# Patient Record
Sex: Male | Born: 1935 | Race: Black or African American | Hispanic: No | Marital: Married | Smoking: Former smoker
Health system: Southern US, Community
[De-identification: ages and names within clinical notes are randomized; demographics above are authoritative.]

## PROBLEM LIST (undated history)

## (undated) DIAGNOSIS — J449 Chronic obstructive pulmonary disease, unspecified: Secondary | ICD-10-CM

## (undated) DIAGNOSIS — I739 Peripheral vascular disease, unspecified: Secondary | ICD-10-CM

## (undated) DIAGNOSIS — I82409 Acute embolism and thrombosis of unspecified deep veins of unspecified lower extremity: Secondary | ICD-10-CM

## (undated) DIAGNOSIS — Z7901 Long term (current) use of anticoagulants: Secondary | ICD-10-CM

## (undated) DIAGNOSIS — I509 Heart failure, unspecified: Secondary | ICD-10-CM

## (undated) DIAGNOSIS — D649 Anemia, unspecified: Secondary | ICD-10-CM

## (undated) DIAGNOSIS — N189 Chronic kidney disease, unspecified: Secondary | ICD-10-CM

## (undated) DIAGNOSIS — N183 Chronic kidney disease, stage 3 unspecified: Secondary | ICD-10-CM

## (undated) DIAGNOSIS — M069 Rheumatoid arthritis, unspecified: Secondary | ICD-10-CM

## (undated) DIAGNOSIS — I10 Essential (primary) hypertension: Secondary | ICD-10-CM

## (undated) HISTORY — PX: ESOPHAGEAL DILATION: SHX303

## (undated) HISTORY — PX: COLONOSCOPY W/ BIOPSIES AND POLYPECTOMY: SHX1376

## (undated) HISTORY — PX: CATARACT EXTRACTION: SUR2

---

## 1980-05-24 HISTORY — PX: HERNIA REPAIR: SHX51

## 1998-03-03 ENCOUNTER — Ambulatory Visit (HOSPITAL_COMMUNITY): Admission: RE | Admit: 1998-03-03 | Discharge: 1998-03-03 | Payer: Self-pay | Admitting: Gastroenterology

## 1998-04-01 ENCOUNTER — Encounter: Payer: Self-pay | Admitting: Emergency Medicine

## 1998-04-01 ENCOUNTER — Emergency Department (HOSPITAL_COMMUNITY): Admission: EM | Admit: 1998-04-01 | Discharge: 1998-04-01 | Payer: Self-pay | Admitting: Emergency Medicine

## 2003-01-14 ENCOUNTER — Ambulatory Visit (HOSPITAL_COMMUNITY): Admission: RE | Admit: 2003-01-14 | Discharge: 2003-01-14 | Payer: Self-pay | Admitting: Gastroenterology

## 2003-01-14 ENCOUNTER — Encounter: Payer: Self-pay | Admitting: Gastroenterology

## 2003-08-20 ENCOUNTER — Ambulatory Visit (HOSPITAL_COMMUNITY): Admission: RE | Admit: 2003-08-20 | Discharge: 2003-08-20 | Payer: Self-pay | Admitting: Gastroenterology

## 2003-08-20 ENCOUNTER — Encounter (INDEPENDENT_AMBULATORY_CARE_PROVIDER_SITE_OTHER): Payer: Self-pay | Admitting: Specialist

## 2004-05-01 ENCOUNTER — Encounter: Admission: RE | Admit: 2004-05-01 | Discharge: 2004-05-01 | Payer: Self-pay | Admitting: General Surgery

## 2004-05-05 ENCOUNTER — Ambulatory Visit (HOSPITAL_BASED_OUTPATIENT_CLINIC_OR_DEPARTMENT_OTHER): Admission: RE | Admit: 2004-05-05 | Discharge: 2004-05-05 | Payer: Self-pay | Admitting: General Surgery

## 2004-05-05 ENCOUNTER — Ambulatory Visit (HOSPITAL_COMMUNITY): Admission: RE | Admit: 2004-05-05 | Discharge: 2004-05-05 | Payer: Self-pay | Admitting: General Surgery

## 2004-07-23 ENCOUNTER — Encounter: Admission: RE | Admit: 2004-07-23 | Discharge: 2004-07-23 | Payer: Self-pay | Admitting: Gastroenterology

## 2004-08-28 ENCOUNTER — Encounter (INDEPENDENT_AMBULATORY_CARE_PROVIDER_SITE_OTHER): Payer: Self-pay | Admitting: Specialist

## 2004-08-28 ENCOUNTER — Ambulatory Visit (HOSPITAL_COMMUNITY): Admission: RE | Admit: 2004-08-28 | Discharge: 2004-08-28 | Payer: Self-pay | Admitting: Gastroenterology

## 2005-03-19 ENCOUNTER — Encounter: Admission: RE | Admit: 2005-03-19 | Discharge: 2005-03-19 | Payer: Self-pay | Admitting: Rheumatology

## 2005-07-27 ENCOUNTER — Encounter (HOSPITAL_BASED_OUTPATIENT_CLINIC_OR_DEPARTMENT_OTHER): Admission: RE | Admit: 2005-07-27 | Discharge: 2005-10-25 | Payer: Self-pay | Admitting: Surgery

## 2005-10-28 ENCOUNTER — Encounter (HOSPITAL_BASED_OUTPATIENT_CLINIC_OR_DEPARTMENT_OTHER): Admission: RE | Admit: 2005-10-28 | Discharge: 2006-01-26 | Payer: Self-pay | Admitting: Surgery

## 2006-01-28 ENCOUNTER — Encounter (HOSPITAL_BASED_OUTPATIENT_CLINIC_OR_DEPARTMENT_OTHER): Admission: RE | Admit: 2006-01-28 | Discharge: 2006-02-20 | Payer: Self-pay | Admitting: Surgery

## 2006-02-23 ENCOUNTER — Encounter (HOSPITAL_BASED_OUTPATIENT_CLINIC_OR_DEPARTMENT_OTHER): Admission: RE | Admit: 2006-02-23 | Discharge: 2006-05-19 | Payer: Self-pay | Admitting: Surgery

## 2006-05-25 ENCOUNTER — Encounter (HOSPITAL_BASED_OUTPATIENT_CLINIC_OR_DEPARTMENT_OTHER): Admission: RE | Admit: 2006-05-25 | Discharge: 2006-08-23 | Payer: Self-pay | Admitting: Surgery

## 2006-06-02 ENCOUNTER — Ambulatory Visit: Admission: EM | Admit: 2006-06-02 | Discharge: 2006-06-02 | Payer: Self-pay | Admitting: Surgery

## 2006-06-02 ENCOUNTER — Encounter: Payer: Self-pay | Admitting: Vascular Surgery

## 2006-06-20 ENCOUNTER — Ambulatory Visit: Payer: Self-pay | Admitting: Internal Medicine

## 2006-06-20 LAB — CONVERTED CEMR LAB
Alkaline Phosphatase: 62 units/L (ref 39–117)
BUN: 21 mg/dL (ref 6–23)
Basophils Absolute: 0 10*3/uL (ref 0.0–0.1)
Basophils Relative: 0.3 % (ref 0.0–1.0)
Chloride: 105 meq/L (ref 96–112)
GFR calc Af Amer: 68 mL/min
GFR calc non Af Amer: 56 mL/min
Glucose, Bld: 102 mg/dL — ABNORMAL HIGH (ref 70–99)
MCHC: 32.5 g/dL (ref 30.0–36.0)
MCV: 86.4 fL (ref 78.0–100.0)
Monocytes Relative: 9 % (ref 3.0–11.0)
Neutrophils Relative %: 79.1 % — ABNORMAL HIGH (ref 43.0–77.0)
Platelets: 285 10*3/uL (ref 150–400)
Potassium: 4.8 meq/L (ref 3.5–5.1)
Pro B Natriuretic peptide (BNP): 15 pg/mL (ref 0.0–100.0)
Total Bilirubin: 0.5 mg/dL (ref 0.3–1.2)
Total Protein: 6.9 g/dL (ref 6.0–8.3)
WBC: 7.4 10*3/uL (ref 4.5–10.5)

## 2006-06-29 ENCOUNTER — Ambulatory Visit: Payer: Self-pay | Admitting: Internal Medicine

## 2006-06-29 ENCOUNTER — Ambulatory Visit: Payer: Self-pay | Admitting: Cardiology

## 2006-07-01 ENCOUNTER — Ambulatory Visit: Admission: RE | Admit: 2006-07-01 | Discharge: 2006-07-01 | Payer: Self-pay | Admitting: Surgery

## 2006-07-01 ENCOUNTER — Ambulatory Visit: Payer: Self-pay | Admitting: Vascular Surgery

## 2006-07-11 ENCOUNTER — Ambulatory Visit: Payer: Self-pay | Admitting: Internal Medicine

## 2006-08-08 ENCOUNTER — Ambulatory Visit: Payer: Self-pay | Admitting: Internal Medicine

## 2006-08-30 ENCOUNTER — Encounter (HOSPITAL_BASED_OUTPATIENT_CLINIC_OR_DEPARTMENT_OTHER): Admission: RE | Admit: 2006-08-30 | Discharge: 2006-11-28 | Payer: Self-pay | Admitting: Surgery

## 2006-12-02 ENCOUNTER — Encounter: Admission: RE | Admit: 2006-12-02 | Discharge: 2007-02-21 | Payer: Self-pay | Admitting: Internal Medicine

## 2007-02-22 ENCOUNTER — Encounter (HOSPITAL_BASED_OUTPATIENT_CLINIC_OR_DEPARTMENT_OTHER): Admission: RE | Admit: 2007-02-22 | Discharge: 2007-05-23 | Payer: Self-pay | Admitting: Internal Medicine

## 2007-05-23 ENCOUNTER — Encounter (HOSPITAL_BASED_OUTPATIENT_CLINIC_OR_DEPARTMENT_OTHER): Admission: RE | Admit: 2007-05-23 | Discharge: 2007-08-21 | Payer: Self-pay | Admitting: Internal Medicine

## 2007-08-23 ENCOUNTER — Encounter (HOSPITAL_BASED_OUTPATIENT_CLINIC_OR_DEPARTMENT_OTHER): Admission: RE | Admit: 2007-08-23 | Discharge: 2007-11-03 | Payer: Self-pay | Admitting: Surgery

## 2008-01-30 ENCOUNTER — Encounter: Admission: RE | Admit: 2008-01-30 | Discharge: 2008-01-30 | Payer: Self-pay | Admitting: Orthopedic Surgery

## 2008-01-31 ENCOUNTER — Encounter (INDEPENDENT_AMBULATORY_CARE_PROVIDER_SITE_OTHER): Payer: Self-pay | Admitting: Orthopedic Surgery

## 2008-01-31 ENCOUNTER — Ambulatory Visit (HOSPITAL_BASED_OUTPATIENT_CLINIC_OR_DEPARTMENT_OTHER): Admission: RE | Admit: 2008-01-31 | Discharge: 2008-01-31 | Payer: Self-pay | Admitting: Orthopedic Surgery

## 2008-11-26 ENCOUNTER — Ambulatory Visit (HOSPITAL_BASED_OUTPATIENT_CLINIC_OR_DEPARTMENT_OTHER): Admission: RE | Admit: 2008-11-26 | Discharge: 2008-11-26 | Payer: Self-pay | Admitting: Orthopedic Surgery

## 2009-10-07 ENCOUNTER — Encounter: Admission: RE | Admit: 2009-10-07 | Discharge: 2009-10-07 | Payer: Self-pay | Admitting: Gastroenterology

## 2010-06-25 ENCOUNTER — Emergency Department (HOSPITAL_COMMUNITY): Payer: MEDICARE

## 2010-06-25 ENCOUNTER — Inpatient Hospital Stay (HOSPITAL_COMMUNITY)
Admission: EM | Admit: 2010-06-25 | Discharge: 2010-07-02 | DRG: 175 | Disposition: A | Discharge status: Home / Self Care | Payer: MEDICARE | Attending: Family Medicine | Admitting: Family Medicine

## 2010-06-25 ENCOUNTER — Encounter (HOSPITAL_COMMUNITY): Payer: Self-pay | Admitting: Radiology

## 2010-06-25 DIAGNOSIS — I2789 Other specified pulmonary heart diseases: Secondary | ICD-10-CM | POA: Diagnosis present

## 2010-06-25 DIAGNOSIS — I2699 Other pulmonary embolism without acute cor pulmonale: Principal | ICD-10-CM | POA: Diagnosis present

## 2010-06-25 DIAGNOSIS — N189 Chronic kidney disease, unspecified: Secondary | ICD-10-CM | POA: Diagnosis present

## 2010-06-25 DIAGNOSIS — I825Z9 Chronic embolism and thrombosis of unspecified deep veins of unspecified distal lower extremity: Secondary | ICD-10-CM | POA: Diagnosis present

## 2010-06-25 DIAGNOSIS — I825Y9 Chronic embolism and thrombosis of unspecified deep veins of unspecified proximal lower extremity: Secondary | ICD-10-CM | POA: Diagnosis present

## 2010-06-25 DIAGNOSIS — I129 Hypertensive chronic kidney disease with stage 1 through stage 4 chronic kidney disease, or unspecified chronic kidney disease: Secondary | ICD-10-CM | POA: Diagnosis present

## 2010-06-25 DIAGNOSIS — Z8261 Family history of arthritis: Secondary | ICD-10-CM

## 2010-06-25 DIAGNOSIS — I872 Venous insufficiency (chronic) (peripheral): Secondary | ICD-10-CM | POA: Diagnosis present

## 2010-06-25 DIAGNOSIS — Z7982 Long term (current) use of aspirin: Secondary | ICD-10-CM

## 2010-06-25 DIAGNOSIS — J189 Pneumonia, unspecified organism: Secondary | ICD-10-CM | POA: Diagnosis present

## 2010-06-25 DIAGNOSIS — Z7901 Long term (current) use of anticoagulants: Secondary | ICD-10-CM

## 2010-06-25 DIAGNOSIS — M069 Rheumatoid arthritis, unspecified: Secondary | ICD-10-CM | POA: Diagnosis present

## 2010-06-25 DIAGNOSIS — Z87891 Personal history of nicotine dependence: Secondary | ICD-10-CM

## 2010-06-25 DIAGNOSIS — N179 Acute kidney failure, unspecified: Secondary | ICD-10-CM | POA: Diagnosis present

## 2010-06-25 DIAGNOSIS — M109 Gout, unspecified: Secondary | ICD-10-CM | POA: Diagnosis present

## 2010-06-25 HISTORY — DX: Essential (primary) hypertension: I10

## 2010-06-25 LAB — CK TOTAL AND CKMB (NOT AT ARMC)
CK, MB: 1 ng/mL (ref 0.3–4.0)
Relative Index: INVALID (ref 0.0–2.5)
Total CK: 37 U/L (ref 7–232)

## 2010-06-25 LAB — COMPREHENSIVE METABOLIC PANEL
ALT: 20 U/L (ref 0–53)
BUN: 28 mg/dL — ABNORMAL HIGH (ref 6–23)
CO2: 27 mEq/L (ref 19–32)
GFR calc non Af Amer: 35 mL/min — ABNORMAL LOW (ref >60)
Glucose, Bld: 134 mg/dL — ABNORMAL HIGH (ref 70–99)
Total Bilirubin: 0.4 mg/dL (ref 0.3–1.2)
Total Protein: 6.4 g/dL (ref 6.0–8.3)

## 2010-06-25 LAB — TROPONIN I: Troponin I: 0.02 ng/mL (ref 0.00–0.06)

## 2010-06-25 LAB — POCT CARDIAC MARKERS
Myoglobin, poc: 134 ng/mL (ref 12–200)
Myoglobin, poc: 118 ng/mL (ref 12–200)
Troponin i, poc: <0.05 ng/mL (ref 0.00–0.09)
Troponin i, poc: <0.05 ng/mL (ref 0.00–0.09)

## 2010-06-25 LAB — DIFFERENTIAL
Basophils Absolute: 0 10*3/uL (ref 0.0–0.1)
Eosinophils Absolute: 0.2 10*3/uL (ref 0.0–0.7)
Monocytes Relative: 12 % (ref 3–12)
Neutro Abs: 8.4 10*3/uL — ABNORMAL HIGH (ref 1.7–7.7)

## 2010-06-25 LAB — CBC
MCHC: 34.1 g/dL (ref 30.0–36.0)
RDW: 17 % — ABNORMAL HIGH (ref 11.5–15.5)

## 2010-06-26 ENCOUNTER — Inpatient Hospital Stay (HOSPITAL_COMMUNITY): Payer: MEDICARE

## 2010-06-26 DIAGNOSIS — R0602 Shortness of breath: Secondary | ICD-10-CM

## 2010-06-26 LAB — LIPID PANEL
Cholesterol: 148 mg/dL (ref 0–200)
HDL: 72 mg/dL (ref >39)
LDL Cholesterol: 72 mg/dL (ref 0–99)

## 2010-06-26 LAB — CBC
HCT: 29.8 % — ABNORMAL LOW (ref 39.0–52.0)
MCH: 27.9 pg (ref 26.0–34.0)
RBC: 3.62 MIL/uL — ABNORMAL LOW (ref 4.22–5.81)
RDW: 16.6 % — ABNORMAL HIGH (ref 11.5–15.5)

## 2010-06-26 LAB — CARDIAC PANEL(CRET KIN+CKTOT+MB+TROPI)
Relative Index: INVALID (ref 0.0–2.5)
Total CK: 42 U/L (ref 7–232)
Troponin I: 0.02 ng/mL (ref 0.00–0.06)
Troponin I: 0.02 ng/mL (ref 0.00–0.06)

## 2010-06-26 LAB — COMPREHENSIVE METABOLIC PANEL
AST: 22 U/L (ref 0–37)
Albumin: 2.9 g/dL — ABNORMAL LOW (ref 3.5–5.2)
Alkaline Phosphatase: 50 U/L (ref 39–117)
Chloride: 103 mEq/L (ref 96–112)
GFR calc Af Amer: 46 mL/min — ABNORMAL LOW (ref >60)
GFR calc non Af Amer: 38 mL/min — ABNORMAL LOW (ref >60)
Glucose, Bld: 151 mg/dL — ABNORMAL HIGH (ref 70–99)
Total Bilirubin: 0.3 mg/dL (ref 0.3–1.2)

## 2010-06-26 LAB — HEMOGLOBIN A1C
Hgb A1c MFr Bld: 6.3 % — ABNORMAL HIGH (ref <5.7)
Mean Plasma Glucose: 134 mg/dL — ABNORMAL HIGH (ref <117)

## 2010-06-26 LAB — BRAIN NATRIURETIC PEPTIDE: Pro B Natriuretic peptide (BNP): <30 pg/mL (ref 0.0–100.0)

## 2010-06-26 LAB — TSH: TSH: 1.256 u[IU]/mL (ref 0.350–4.500)

## 2010-06-26 MED ORDER — XENON XE 133 GAS
10.0000 | GAS_FOR_INHALATION | Freq: Once | RESPIRATORY_TRACT | Status: AC | PRN
  Administered 2010-06-26: 10 via RESPIRATORY_TRACT

## 2010-06-26 MED ORDER — TECHNETIUM TO 99M ALBUMIN AGGREGATED
6.0000 | Freq: Once | INTRAVENOUS | Status: AC | PRN
  Administered 2010-06-26: 6 via INTRAVENOUS

## 2010-06-27 LAB — BASIC METABOLIC PANEL
CO2: 22 mEq/L (ref 19–32)
Calcium: 8.3 mg/dL — ABNORMAL LOW (ref 8.4–10.5)
Chloride: 105 mEq/L (ref 96–112)
Glucose, Bld: 110 mg/dL — ABNORMAL HIGH (ref 70–99)
Sodium: 139 mEq/L (ref 135–145)

## 2010-06-27 LAB — CBC
Hemoglobin: 9.9 g/dL — ABNORMAL LOW (ref 13.0–17.0)
Platelets: 160 10*3/uL (ref 150–400)
WBC: 14.1 10*3/uL — ABNORMAL HIGH (ref 4.0–10.5)

## 2010-06-28 ENCOUNTER — Inpatient Hospital Stay (HOSPITAL_COMMUNITY): Payer: MEDICARE

## 2010-06-28 LAB — CBC
MCHC: 34.6 g/dL (ref 30.0–36.0)
RBC: 4.2 MIL/uL — ABNORMAL LOW (ref 4.22–5.81)
WBC: 14.1 10*3/uL — ABNORMAL HIGH (ref 4.0–10.5)

## 2010-06-28 LAB — BASIC METABOLIC PANEL
BUN: 31 mg/dL — ABNORMAL HIGH (ref 6–23)
CO2: 19 mEq/L (ref 19–32)
Calcium: 8.5 mg/dL (ref 8.4–10.5)
Creatinine, Ser: 1.78 mg/dL — ABNORMAL HIGH (ref 0.4–1.5)
Glucose, Bld: 176 mg/dL — ABNORMAL HIGH (ref 70–99)

## 2010-06-28 LAB — PROTIME-INR: INR: 1.01 (ref 0.00–1.49)

## 2010-06-29 ENCOUNTER — Inpatient Hospital Stay (HOSPITAL_COMMUNITY): Payer: MEDICARE

## 2010-06-29 LAB — CBC
HCT: 28.7 % — ABNORMAL LOW (ref 39.0–52.0)
Hemoglobin: 10.1 g/dL — ABNORMAL LOW (ref 13.0–17.0)
MCV: 79.9 fL (ref 78.0–100.0)
Platelets: 170 10*3/uL (ref 150–400)
RBC: 3.59 MIL/uL — ABNORMAL LOW (ref 4.22–5.81)
WBC: 15.1 10*3/uL — ABNORMAL HIGH (ref 4.0–10.5)

## 2010-06-29 LAB — BASIC METABOLIC PANEL
BUN: 41 mg/dL — ABNORMAL HIGH (ref 6–23)
Chloride: 105 mEq/L (ref 96–112)
Potassium: 4 mEq/L (ref 3.5–5.1)

## 2010-06-29 LAB — BRAIN NATRIURETIC PEPTIDE: Pro B Natriuretic peptide (BNP): 47 pg/mL (ref 0.0–100.0)

## 2010-06-29 LAB — CARDIAC PANEL(CRET KIN+CKTOT+MB+TROPI)
CK, MB: 2.6 ng/mL (ref 0.3–4.0)
Relative Index: 1.4 (ref 0.0–2.5)

## 2010-06-30 LAB — CBC
HCT: 28.8 % — ABNORMAL LOW (ref 39.0–52.0)
MCV: 79.8 fL (ref 78.0–100.0)
RBC: 3.61 MIL/uL — ABNORMAL LOW (ref 4.22–5.81)
RDW: 16.6 % — ABNORMAL HIGH (ref 11.5–15.5)
WBC: 13.3 10*3/uL — ABNORMAL HIGH (ref 4.0–10.5)

## 2010-06-30 LAB — CARDIAC PANEL(CRET KIN+CKTOT+MB+TROPI)
CK, MB: 2.5 ng/mL (ref 0.3–4.0)
Relative Index: 2.1 (ref 0.0–2.5)
Troponin I: 0.02 ng/mL (ref 0.00–0.06)

## 2010-06-30 LAB — COMPREHENSIVE METABOLIC PANEL
ALT: 35 U/L (ref 0–53)
Albumin: 2.4 g/dL — ABNORMAL LOW (ref 3.5–5.2)
Alkaline Phosphatase: 45 U/L (ref 39–117)
GFR calc Af Amer: 53 mL/min — ABNORMAL LOW (ref >60)
Potassium: 3.9 mEq/L (ref 3.5–5.1)
Sodium: 136 mEq/L (ref 135–145)
Total Protein: 5.6 g/dL — ABNORMAL LOW (ref 6.0–8.3)

## 2010-07-01 LAB — PROTIME-INR
INR: 1.48 (ref 0.00–1.49)
Prothrombin Time: 18.1 seconds — ABNORMAL HIGH (ref 11.6–15.2)

## 2010-07-01 LAB — BASIC METABOLIC PANEL
BUN: 29 mg/dL — ABNORMAL HIGH (ref 6–23)
CO2: 24 mEq/L (ref 19–32)
Calcium: 7.9 mg/dL — ABNORMAL LOW (ref 8.4–10.5)
Chloride: 106 mEq/L (ref 96–112)
Creatinine, Ser: 1.4 mg/dL (ref 0.4–1.5)
GFR calc Af Amer: 58 mL/min — ABNORMAL LOW (ref >60)
GFR calc non Af Amer: 48 mL/min — ABNORMAL LOW (ref >60)
Glucose, Bld: 92 mg/dL (ref 70–99)
Potassium: 3.7 mEq/L (ref 3.5–5.1)
Sodium: 139 mEq/L (ref 135–145)

## 2010-07-01 LAB — BRAIN NATRIURETIC PEPTIDE: Pro B Natriuretic peptide (BNP): 45 pg/mL (ref 0.0–100.0)

## 2010-07-01 LAB — CBC
MCH: 28.5 pg (ref 26.0–34.0)
MCV: 80.3 fL (ref 78.0–100.0)
Platelets: 187 10*3/uL (ref 150–400)
RDW: 16.4 % — ABNORMAL HIGH (ref 11.5–15.5)

## 2010-07-02 LAB — CBC
HCT: 30.8 % — ABNORMAL LOW (ref 39.0–52.0)
MCH: 28 pg (ref 26.0–34.0)
MCV: 80.6 fL (ref 78.0–100.0)
Platelets: 206 10*3/uL (ref 150–400)
RBC: 3.82 MIL/uL — ABNORMAL LOW (ref 4.22–5.81)

## 2010-07-02 LAB — CULTURE, BLOOD (ROUTINE X 2)
Culture  Setup Time: 201202030523
Culture: NO GROWTH

## 2010-07-07 NOTE — Discharge Summary (Signed)
  NAME:  SAVANNAH, ERBE NO.:  [id number]  MEDICAL RECORD NO.:  [id number]           PATIENT TYPE:  I  LOCATION:  2020                         FACILITY:  [facility]  PHYSICIAN:  Tarry Kos, MD       DATE OF BIRTH:  December 06, 1920  DATE OF ADMISSION:  06/25/2010 DATE OF DISCHARGE:  07/02/2010                              DISCHARGE SUMMARY   ADDENDUM  Mr. Finstad's discharge was held up yesterday because he did not have a right home.  His INR was checked today, it was 1.98, his INR yesterday was 1.48.  He is new on Coumadin secondary to intermediate probability V/Q scan for PE.  I have given him Coumadin 3 mg today.  He will need an INR checked tomorrow with his primary care physician.  He has been receiving 6 mg of Coumadin for the last 3 or 4 days.          ______________________________ Tarry Kos, MD     RD/MEDQ  D:  07/02/2010  T:  07/03/2010  Job:  [id number]  Electronically Signed by Eldridge Dace MD on 07/07/2010 01:30:38 PM

## 2010-07-07 NOTE — Discharge Summary (Signed)
  NAME:  [name] [name], [name] [name] NO.:  [id number]  MEDICAL RECORD NO.:  [id number]           PATIENT TYPE:  I  LOCATION:  2020                         FACILITY:  [facility]  PHYSICIAN:  Tarry Kos, MD       DATE OF BIRTH:  06-Dec-1920  DATE OF ADMISSION:  06/25/2010 DATE OF DISCHARGE:  07/02/2010                              DISCHARGE SUMMARY   DISCHARGE DIAGNOSES: 1. Pulmonary emboli. 2. Deep venous thrombosis. 3. Community-acquired pneumonia. 4. Pleuritic chest pain. 5. Chronic renal failure. 6. Rheumatoid arthritis.  SUMMARY OF HOSPITAL COURSE:  [name] is an 77 year old male who presented to the emergency room with shortness of breath who was diagnosed with community-acquired pneumonia, but also had a VQ scan, which showed intermediate probability for pulmonary emboli.  He subsequently had ultrasound of his lower extremities, which showed a DVT in his left lower extremity and DVT in right lower extremity, question whether or not this was chronic or not.  He did not have hemodynamic instability problems from his pulmonary emboli.  He remained stable throughout his hospitalization.  His cardiac enzymes were negative.  He did not significant amount of oxygen.  He has been on room air for over 3 days now.  He has been started on Coumadin and Lovenox for bridging. He was given 6 mg of Coumadin today, he was given 6 mg on __________. His INR today on July 01, 2010, is 1.4, his INR on June 30, 2010, was somehow not measured.  On June 29, 2010, it was 1.09.  The patient is being discharged home.  He has refused home health.  He is to follow up with his primary care physician in approximately 2 days and have an INR checked in the morning and have his Coumadin adjusted.  So he will need to be on Coumadin for 6-12 months for his intermediate probability of pulmonary emboli on his VQ scan and consider life-long since he obviously has the history of DVTs in his lower  extremities, which appear chronic.  PHYSICAL EXAMINATION:  VITAL SIGNS:  Again he has been afebrile.  Vital signs stable. GENERAL:  Alert and oriented x1, no apparent distress, cooperative and friendly. CARDIAC:  Regular rate and rhythm without murmurs, rubs, or gallops. CHEST:  Clear to auscultation bilaterally.  No wheeze, rhonchi, or rales. ABDOMEN:  Soft, nontender, nondistended.  Positive bowel sounds.  No hepatosplenomegaly. EXTREMITIES:  No clubbing, cyanosis, or edema. PSYCHIATRIC:  Normal affect. NEUROLOGIC:  No focal neurologic deficits.  He is ambulating well here in the hospital.  Again we have offered to set up home health.  He has refused it.  He has been educated to provide himself the Lovenox injection.  He is to continue Lovenox full dosing, make sure he has INRs between 2 and 3 for 2 consecutive days on Coumadin at which point his Lovenox can stop.          ______________________________ Tarry Kos, MD     RD/MEDQ  D:  07/02/2010  T:  07/03/2010  Job:  [id number]  Electronically Signed by Eldridge Dace MD on 07/07/2010 01:30:49 PM

## 2010-08-01 NOTE — H&P (Signed)
NAME:  [name] [name], FILL NO.:  [id number]  MEDICAL RECORD NO.:  [id number]           PATIENT TYPE:  E  LOCATION:  [facility]                         FACILITY:  [facility]  PHYSICIAN:  Richarda Overlie, MD       DATE OF BIRTH:  07/09/1920  DATE OF ADMISSION:  06/25/2010 DATE OF DISCHARGE:                             HISTORY & PHYSICAL   PRIMARY CARE PHYSICIAN:  Dr. Doristine Counter in [facility].  PRIMARY CARDIOLOGIST:  Nanetta Batty, MD  PRIMARY RHEUMATOLOGIST:  Aundra Dubin, MD  PRIMARY PULMONOLOGIST:  Rennis Chris. Maple Hudson, MD, FCCP, FACP  CHIEF COMPLAINT:  Shortness of breath.  SUBJECTIVE:  This is an 77 year old male with a history of rheumatoid arthritis, several years on methotrexate at home comes in with shortness of breath and dyspnea on exertion, as well as intermittent chest pain for the last 2-3 days.  The patient noted that this morning his breathing was particularly worse at around 7 o'clock and has been progressively getting worse during the course of the day.  It is associated with left-sided chest pain radiating into his left arm. There is no history of any prior coronary artery disease and according to the daughter, the patient never been tested for underlying coronary artery disease and has never had a stress test or a cardiac cath.  He however has had intermittent problems with shortness of breath for over 20 years and he uses a Combivent inhaler every now and then.  He has been evaluated by Dr. Jetty Duhamel and was told that his dyspnea was nonspecific.  He does not have any objective evidence of interstitial lung disease related to his rheumatoid arthritis.  He denies any fever, chills, rigors at home.  Denies any syncope, near-syncope, lightheadedness.  Denies any nausea, vomiting, abdominal pain, diarrhea, constipation, blood in the stool, black tarry stool.  Denies any recent history of travel.  There is no history of DVT or PE.  In the  ED, the patient is found to be tachycardic with a rate of 120 and the rhythm appears to be sinus tachycardia.  He is otherwise hemodynamically stable.  A chest x-ray was suspicious for community-acquired pneumonia.  PAST MEDICAL HISTORY:  History of gout, rheumatoid arthritis, compound fracture of the left tibia, and hypertension.  PAST SURGICAL HISTORY:  Hernia repair.  SOCIAL HISTORY:  He smoked one pack a day for about 10-15 years and quit smoking about 35 years ago.  He quit drinking alcohol in 1946.  He lives at home with his wife.  He has no history of asbestos exposure.  FAMILY HISTORY:  History of rheumatoid arthritis in the father.  PHYSICAL EXAMINATION:  VITAL SIGNS:  Blood pressure 130/80, pulse 125, respirations 22, temperature 98.9. GENERAL:  The patient appears to be comfortable in no acute cardiopulmonary distress. HEENT:  Pupils equal and reactive.  Extraocular movements intact. NECK:  Supple.  No JVD. LUNGS:  Bibasilar rhonchi with minimal wheezing. CARDIOVASCULAR:  Tachycardia with regular rate and rhythm.  No murmurs, rubs, or gallops. ABDOMEN:  Obese, soft, nontender, nondistended. EXTREMITIES:  Trace pedal edema in bilateral lower extremities.  PSYCHIATRIC:  Appropriate mood and affect.  REVIEW OF SYSTEMS:  Complete review of systems was done as documented in HPI.  ALLERGIES:  No known drug allergies.  HOME MEDICATIONS:  Aspirin, lisinopril, methotrexate once a week.  LABORATORY DATA:  Chest x-ray shows infiltrate in the posterior left lower lobe.  Sodium 136, potassium 4.9, chloride 101, bicarb 27, glucose 134, BUN is 28, creatinine 1.86, total protein 6.4, albumin 3.2, AST 25, ALT 20, alk phos 50, total bilirubin 0.4, WBC 10.9, hemoglobin 10.7, hematocrit 31.4, and platelet count of 176.  ASSESSMENT AND PLAN: 1. Shortness of breath, just been probably secondary to community-     acquired pneumonia. 2. Chronic shortness of breath, rule out  interstitial lung disease as     well as congestive heart failure with recent evaluation done by Dr.     Nanetta Batty, 2-3 weeks ago.  The patient is unaware of the     results of his cardiac echo. 3. Renal insufficiency.  The patient is aware of his renal     insufficiency, but his baseline creatinine is unknown.  PLAN:  The patient will be admitted to telemetry for further evaluation of his chest pain and shortness of breath.  We will empirically start him on Avelox for community-acquired pneumonia.  We will start him on DuoNeb q.6.  He did receive a dose of Solu-Medrol in the ED.  However, I do not feel the patient needs continuation of his steroids as he is not wheezing a whole lot and I feel that his shortness of breath is improved significantly after receiving his DuoNeb treatment in the ED.  He will also have a CT of the chest without contrast to rule out any underlying interstitial lung disease.  We will check a D-dimer because of his sinus tachycardia, if this is elevated we will do a V/Q to rule out PE.  We will try to obtain the results of his echocardiogram from Dr. Hazle Coca office in the morning.  His rheumatoid arthritis appears to be stable and I do not feel that he is having a flare, we will hold methotrexate in the setting of his pneumonia. Renal insufficiency.  His baseline creatinine is unknown, but the patient does have a history of renal insufficiency.  We will try to obtain his outpatient labs from Dr. Mellody Life office in the morning and hydrate him with IV fluids to see if his kidney function improves.  He is a full code.     Richarda Overlie, MD     NA/MEDQ  D:  06/25/2010  T:  06/25/2010  Job:  [id number]  Electronically Signed by Richarda Overlie MD on 08/01/2010 07:28:20 AM

## 2010-08-30 LAB — BASIC METABOLIC PANEL
CO2: 28 mEq/L (ref 19–32)
Chloride: 103 mEq/L (ref 96–112)
Creatinine, Ser: 1.56 mg/dL — ABNORMAL HIGH (ref 0.4–1.5)
GFR calc Af Amer: 51 mL/min — ABNORMAL LOW (ref >60)
Glucose, Bld: 97 mg/dL (ref 70–99)

## 2010-10-04 ENCOUNTER — Inpatient Hospital Stay (HOSPITAL_COMMUNITY)
Admission: EM | Admit: 2010-10-04 | Discharge: 2010-10-08 | DRG: 391 | Disposition: A | Discharge status: Home / Self Care | Payer: Medicare Other | Attending: Internal Medicine | Admitting: Internal Medicine

## 2010-10-04 DIAGNOSIS — E871 Hypo-osmolality and hyponatremia: Secondary | ICD-10-CM | POA: Diagnosis present

## 2010-10-04 DIAGNOSIS — I959 Hypotension, unspecified: Secondary | ICD-10-CM | POA: Diagnosis present

## 2010-10-04 DIAGNOSIS — I129 Hypertensive chronic kidney disease with stage 1 through stage 4 chronic kidney disease, or unspecified chronic kidney disease: Secondary | ICD-10-CM | POA: Diagnosis present

## 2010-10-04 DIAGNOSIS — D649 Anemia, unspecified: Secondary | ICD-10-CM | POA: Diagnosis present

## 2010-10-04 DIAGNOSIS — M069 Rheumatoid arthritis, unspecified: Secondary | ICD-10-CM | POA: Diagnosis present

## 2010-10-04 DIAGNOSIS — Z7901 Long term (current) use of anticoagulants: Secondary | ICD-10-CM

## 2010-10-04 DIAGNOSIS — R4182 Altered mental status, unspecified: Secondary | ICD-10-CM | POA: Diagnosis present

## 2010-10-04 DIAGNOSIS — I509 Heart failure, unspecified: Secondary | ICD-10-CM | POA: Diagnosis present

## 2010-10-04 DIAGNOSIS — N400 Enlarged prostate without lower urinary tract symptoms: Secondary | ICD-10-CM | POA: Diagnosis present

## 2010-10-04 DIAGNOSIS — D696 Thrombocytopenia, unspecified: Secondary | ICD-10-CM | POA: Diagnosis present

## 2010-10-04 DIAGNOSIS — N179 Acute kidney failure, unspecified: Secondary | ICD-10-CM | POA: Diagnosis not present

## 2010-10-04 DIAGNOSIS — R000 Tachycardia, unspecified: Secondary | ICD-10-CM | POA: Diagnosis present

## 2010-10-04 DIAGNOSIS — A088 Other specified intestinal infections: Principal | ICD-10-CM | POA: Diagnosis present

## 2010-10-04 DIAGNOSIS — I5030 Unspecified diastolic (congestive) heart failure: Secondary | ICD-10-CM | POA: Diagnosis present

## 2010-10-04 DIAGNOSIS — I872 Venous insufficiency (chronic) (peripheral): Secondary | ICD-10-CM | POA: Diagnosis present

## 2010-10-04 DIAGNOSIS — N183 Chronic kidney disease, stage 3 unspecified: Secondary | ICD-10-CM | POA: Diagnosis present

## 2010-10-04 DIAGNOSIS — L97809 Non-pressure chronic ulcer of other part of unspecified lower leg with unspecified severity: Secondary | ICD-10-CM | POA: Diagnosis present

## 2010-10-04 DIAGNOSIS — J9600 Acute respiratory failure, unspecified whether with hypoxia or hypercapnia: Secondary | ICD-10-CM | POA: Diagnosis not present

## 2010-10-04 DIAGNOSIS — R4789 Other speech disturbances: Secondary | ICD-10-CM | POA: Diagnosis present

## 2010-10-04 DIAGNOSIS — I2782 Chronic pulmonary embolism: Secondary | ICD-10-CM | POA: Diagnosis present

## 2010-10-05 ENCOUNTER — Inpatient Hospital Stay (HOSPITAL_COMMUNITY): Payer: Medicare Other

## 2010-10-05 ENCOUNTER — Emergency Department (HOSPITAL_COMMUNITY): Payer: Medicare Other

## 2010-10-05 LAB — POCT I-STAT, CHEM 8
BUN: 45 mg/dL — ABNORMAL HIGH (ref 6–23)
Creatinine, Ser: 2 mg/dL — ABNORMAL HIGH (ref 0.4–1.5)
Glucose, Bld: 111 mg/dL — ABNORMAL HIGH (ref 70–99)
Potassium: 5 mEq/L (ref 3.5–5.1)
Sodium: 133 mEq/L — ABNORMAL LOW (ref 135–145)
TCO2: 24 mmol/L (ref 0–100)

## 2010-10-05 LAB — DIFFERENTIAL
Basophils Relative: 0 % (ref 0–1)
Eosinophils Absolute: 0 10*3/uL (ref 0.0–0.7)
Eosinophils Relative: 0 % (ref 0–5)
Monocytes Absolute: 0.7 10*3/uL (ref 0.1–1.0)
Monocytes Relative: 8 % (ref 3–12)

## 2010-10-05 LAB — CBC
HCT: 29.2 % — ABNORMAL LOW (ref 39.0–52.0)
Hemoglobin: 10.2 g/dL — ABNORMAL LOW (ref 13.0–17.0)
Hemoglobin: 11.6 g/dL — ABNORMAL LOW (ref 13.0–17.0)
MCH: 27.9 pg (ref 26.0–34.0)
MCH: 28.6 pg (ref 26.0–34.0)
MCHC: 33.8 g/dL (ref 30.0–36.0)
MCHC: 34.9 g/dL (ref 30.0–36.0)
MCV: 81.8 fL (ref 78.0–100.0)
Platelets: 180 10*3/uL (ref 150–400)
RDW: 17.6 % — ABNORMAL HIGH (ref 11.5–15.5)

## 2010-10-05 LAB — COMPREHENSIVE METABOLIC PANEL
ALT: 22 U/L (ref 0–53)
ALT: 26 U/L (ref 0–53)
AST: 28 U/L (ref 0–37)
CO2: 25 mEq/L (ref 19–32)
Calcium: 9.5 mg/dL (ref 8.4–10.5)
Calcium: 8.1 mg/dL — ABNORMAL LOW (ref 8.4–10.5)
GFR calc Af Amer: 45 mL/min — ABNORMAL LOW (ref >60)
GFR calc Af Amer: 54 mL/min — ABNORMAL LOW (ref >60)
GFR calc non Af Amer: 37 mL/min — ABNORMAL LOW (ref >60)
Glucose, Bld: 186 mg/dL — ABNORMAL HIGH (ref 70–99)
Sodium: 132 mEq/L — ABNORMAL LOW (ref 135–145)
Sodium: 134 mEq/L — ABNORMAL LOW (ref 135–145)
Total Protein: 6 g/dL (ref 6.0–8.3)

## 2010-10-05 LAB — URINALYSIS, ROUTINE W REFLEX MICROSCOPIC
Leukocytes, UA: NEGATIVE
Protein, ur: NEGATIVE mg/dL
Urobilinogen, UA: 0.2 mg/dL (ref 0.0–1.0)

## 2010-10-05 LAB — TSH: TSH: 0.951 u[IU]/mL (ref 0.350–4.500)

## 2010-10-05 LAB — PROTIME-INR: Prothrombin Time: 25.8 seconds — ABNORMAL HIGH (ref 11.6–15.2)

## 2010-10-05 LAB — URINE MICROSCOPIC-ADD ON

## 2010-10-05 LAB — CK TOTAL AND CKMB (NOT AT ARMC): Total CK: 165 U/L (ref 7–232)

## 2010-10-05 LAB — PROCALCITONIN: Procalcitonin: 0.33 ng/mL

## 2010-10-05 LAB — HEPATIC FUNCTION PANEL
ALT: 24 U/L (ref 0–53)
AST: 32 U/L (ref 0–37)
Bilirubin, Direct: 0.1 mg/dL (ref 0.0–0.3)
Total Bilirubin: 0.3 mg/dL (ref 0.3–1.2)

## 2010-10-05 LAB — POCT CARDIAC MARKERS
CKMB, poc: 1.2 ng/mL (ref 1.0–8.0)
Myoglobin, poc: >500 ng/mL (ref 12–200)

## 2010-10-05 NOTE — Consult Note (Signed)
NAME:  [name] [name], [name] [name] NO.:  [id number]  MEDICAL RECORD NO.:  [id number]           PATIENT TYPE:  E  LOCATION:  [facility]                         FACILITY:  [facility]  PHYSICIAN:  Mont Dutton, MD    DATE OF BIRTH:  1920/05/26  DATE OF CONSULTATION:  10/04/2010 DATE OF DISCHARGE:                                CONSULTATION   REQUESTING PHYSICIAN:  Hassan Buckler. Caporossi, MD.  REASON FOR CONSULTATION:  "Stroke."  HISTORY OF PRESENT ILLNESS:  This is an 77 year old African American male with a history of PE, rheumatoid arthritis, hypertension, who went to dinner tonight with family.  When they returned home, family noticed that his speech was garbled.  They then tried to get him into bed, but he was unable to stand.  911 was called.  EMS document tachycardiac, PVCs, and hot to the touch.  No focal deficits were noted on their examination.  He states that he has had urinary frequency for the past few days.  At the moment, he complains of being very cold and is having rigors.REVIEW OF SYSTEMS:  Complete 14-point review of systems is negative other than what is mentioned in the HPI.  PAST MEDICAL HISTORY: 1. Rheumatoid arthritis. 2. Gout. 3. Peripheral vascular disease. 4. Lower extremity ulcer. 5. COPD. 6. Hypertension. 7. Left rotator cuff injury. 8. 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 9. PE. 10. DVT  MEDICATIONS: 1. Lisinopril 20 mg daily. 2. Prilosec 20 mg daily. 3. Prednisone. 4. Coumadin. 5. Tamsulosin 0.5 mg at bedtime. 6. Lasix 20 mg daily. 7. Methotrexate 2.5 mg 4 tablets every Monday and Tuesday. 8. Calcium. 9. Folic acid 1 mg daily.  ALLERGIES:  CONTRAST DYE.  FAMILY HISTORY:  Noncontributory.  SOCIAL HISTORY:  He lives with his wife.  He denies smoking, alcohol or illicits.  PHYSICAL EXAMINATION:  VITAL SIGNS:  Oral temperature read greater than 102.  Additional vitals have not been acquired yet. GENERAL:   Supine, elderly male with rigors. HEENT:  Mucous membranes moist. CARDIOVASCULAR:  Tachycardic, regular. RESPIRATORY:  Clear to auscultation anteriorly. ABDOMEN:  Soft. SKIN:  No rashes and lesions. NECK:  Supple. MENTAL STATUS:  Alert and oriented to person, place, and year.  No dysarthria is noted.  Naming and repetition are intact.  Speech is fluent. CRANIAL NERVES:  Pupils are equal, round, and reactive to light.  Full visual fields.  Extraocular movements intact.  No facial sensory deficits.  Facial expression is symmetric.  Shoulder shrug 5/5 bilaterally.  Tongue midline. MOTOR:  Right upper extremity strength is 5/5.  Left upper extremity is 5/5 distally, but proximal left upper extremity strength is limited by rotator cuff injury.  Bilateral lower extremity strength is 3/5.  Deep tendon reflexes 2+/4 x4 with toes downgoing bilaterally.  SENSATION:  No deficits to light touch, temperature throughout. COORDINATION:  No ataxia with bilateral finger-to-nose.  LABORATORY DATA:  Not acquired yet.  IMAGING:  Noncontrast head CT shows no acute intracranial process.  Very mild atrophy is noted.  ASSESSMENT AND PLAN:  Ms. Andel is an 77 year old male with a history of pulmonary embolism (on  Coumadin), rheumatoid arthritis, and hypertension, who presents with garbled speech and difficulty standing secondary to bilateral lower extremity weakness.  On examination, no focal neurologic deficits are noted.  He is febrile and is having rigors.  Stroke code has been canceled as a stroke is very unlikely.  I suspect an infection such as a urinary tract infection or pneumonia.                                          If infectious workup is negative, order MRI brain.           ______________________________ Mont Dutton, MD     CS/MEDQ  D:  10/05/2010  T:  10/05/2010  Job:  [id number]  Electronically Signed by Mont Dutton MD on 10/05/2010 02:04:23 AM

## 2010-10-06 LAB — RENAL FUNCTION PANEL
Albumin: 2.2 g/dL — ABNORMAL LOW (ref 3.5–5.2)
CO2: 24 mEq/L (ref 19–32)
Calcium: 7.5 mg/dL — ABNORMAL LOW (ref 8.4–10.5)
GFR calc Af Amer: 58 mL/min — ABNORMAL LOW (ref >60)
GFR calc non Af Amer: 48 mL/min — ABNORMAL LOW (ref >60)
Phosphorus: 4.1 mg/dL (ref 2.3–4.6)
Sodium: 133 mEq/L — ABNORMAL LOW (ref 135–145)

## 2010-10-06 LAB — PROTIME-INR: INR: 1.78 — ABNORMAL HIGH (ref 0.00–1.49)

## 2010-10-06 LAB — URINE CULTURE
Colony Count: NO GROWTH
Culture  Setup Time: 201205140042

## 2010-10-06 LAB — DIFFERENTIAL
Basophils Absolute: 0 10*3/uL (ref 0.0–0.1)
Basophils Relative: 0 % (ref 0–1)
Eosinophils Absolute: 0 10*3/uL (ref 0.0–0.7)
Neutrophils Relative %: 87 % — ABNORMAL HIGH (ref 43–77)

## 2010-10-06 LAB — CBC
Platelets: 110 10*3/uL — ABNORMAL LOW (ref 150–400)
RBC: 3.3 MIL/uL — ABNORMAL LOW (ref 4.22–5.81)
RDW: 17.5 % — ABNORMAL HIGH (ref 11.5–15.5)
WBC: 5.3 10*3/uL (ref 4.0–10.5)

## 2010-10-06 LAB — LACTIC ACID, PLASMA: Lactic Acid, Venous: 1.2 mmol/L (ref 0.5–2.2)

## 2010-10-06 NOTE — Assessment & Plan Note (Signed)
[facility]   NAME:  RODRIQUES, BADIE                ACCOUNT NO.:  [id number]   MEDICAL RECORD NO.:  [id number]      DATE OF BIRTH:  [birth year]   PHYSICIAN:  Theresia Majors. Tanda Rockers, M.D. VISIT DATE:  09/29/2007                                   OFFICE VISIT   SUBJECTIVE:  Mr. Aburto is an 77 year old man who underwent an I&D of  an abscess involving his left lower extremity one on Sep 27, 2007.  In  the interim, there has been no excessive drainage or malodor.  He  continues to be ambulatory.  He has been treated with a mildly  compressive wrap.   OBJECTIVE:  VITAL SIGNS: Blood pressure is 121/67, respirations 16,  pulse rate 81, and temperature is 98.1.  EXTREMITIES: Inspection of the left medial ankle shows that wound number  #2, the distal wound is completely resolved.  The proximal wound has a  diameter of 2.5 cm with a depth of 0.5 cm.  There is healthy-appearing  granulation.  The wound is clean.  There is no evidence of loculation.  The pedal pulses slightly palpable.  Capillary refill is brisk.   ASSESSMENT:  Adequately drained abscess.   PLAN:  We will be placing an iodoform gauze and Unna boot.  We will  reevaluate the patient in 1 week.  We anticipate that this wound will  likely show marked improvement in the interim.  If the patient develops  fever or increased drainage, he will call for an interim appointment.  In the meantime, he will continue on a decreased dosage of his  methotrexate as recommended by the rheumatologist.  He will continue on  doxycycline 100 mg p.o. b.i.d.      Harold A. Tanda Rockers, M.D.  Electronically Signed     HAN/MEDQ  D:  09/29/2007  T:  09/30/2007  Job:  [id number]

## 2010-10-06 NOTE — Assessment & Plan Note (Signed)
[facility]   NAME:  [name], [name] NO.:  [id number]   MEDICAL RECORD NO.:  [id number]      DATE OF BIRTH:  [birth year]   PHYSICIAN:  Maxwell Caul, M.D. VISIT DATE:  05/12/2007                                   OFFICE VISIT   [name] returns today in follow-up of his very refractory ulcer on  the medial left leg.  He has had a trial of wound vac, Apligraf and I  believe Regranex which has not helped.  He continues on methotrexate for  rheumatoid arthritis however his corticosteroids have recently been  stopped.   PHYSICAL EXAMINATION:  VITAL SIGNS:  Stable, temperature is 98.3.  The wound itself has really not changed all that much currently  measuring 0.9 x 0.4 x 0.3.  Once again I cleaned out the base of this  wound which is basically removing a tan colored eschar and the base of  this looks like healthy granulation tissue.  I believe he has attempted  epithelialization crawling down from the tops of the wound but the depth  of the wound is not filling in.   IMPRESSION:  Largely refractory venous stasis wound.  I continued with a  Prisma hydrogel based regimen, however, I may change him to silver cell  or Silverlon hydrogel based regimen next time.  I would like to try  another Apligraf at some point although the previous one did not hold up  very well.  There was difficulties with getting the Apligraf to adhere  to the base of the wound.  We will see him again in 2 weeks.           ______________________________  Maxwell Caul, M.D.     MGR/MEDQ  D:  05/12/2007  T:  05/13/2007  Job:  [id number]

## 2010-10-06 NOTE — Assessment & Plan Note (Signed)
[facility]   NAME:  [name] [name], UTSEY NO.:  [id number]   MEDICAL RECORD NO.:  [id number]      DATE OF BIRTH:  August 31, 1920   PHYSICIAN:  Maxwell Caul, M.D. VISIT DATE:  12/02/2006                                   OFFICE VISIT   PURPOSE OF TODAY'S VISIT:  Continued followup of his stasis ulceration  on the medial aspect of his left ankle.  He has been continued on an  Oasis protocol with continued compression using an Unna.  He has no  excessive drainage, odor, or pain.   EXAMINATION:  Temperature is 97.1, pulse 83, respirations 18, blood  pressure 114/65.  The left ankle wound is a thin linear wound measuring 1x0.3x0.1.  I  think there has been some improvement in this wound since we started the  Oasis with some overall volume decrease.  However, the improvement has  been mild.   PLAN:  We are going to continue the Oasis protocol with an Unna wrap.  We will evaluate the patient 1 week's time.           ______________________________  Maxwell Caul, M.D.     MGR/MEDQ  D:  12/02/2006  T:  12/03/2006  Job:  [id number]

## 2010-10-06 NOTE — Assessment & Plan Note (Signed)
[facility]   NAME:  [name] [name], [name] [name]                ACCOUNT NO.:  [id number]   MEDICAL RECORD NO.:  [id number]      DATE OF BIRTH:  01/13/1921   PHYSICIAN:  Jonelle Sports. Sevier, M.D.  VISIT DATE:  09/20/2007                                   OFFICE VISIT   HISTORY OF PRESENT ILLNESS:  This 77 year old black male is followed for  a stasis ulceration on the left medial ankle just proximal to the medial  malleolus in the so-called gaiter area, which is an area of a rather  cobblestone skin in his case.  There has been a somewhat deep and slow  to heal wound in that area which most recently has been treated with a  packing with a silver material and then the extremity placed in an Unna  wrap.   The patient reports since last visit no new problems, and he feels as if  he is doing well.  He has had no focal symptoms related to the wound  itself, and no fever or systemic symptoms.   PHYSICAL EXAMINATION:  VITAL SIGNS:  Blood pressure 118/74, pulse 82,  respirations 16, and temperature 98.3.   The wound on the left medial ankle measures 0.4 x 0.1 x 0.2 cm in depth  and appears loaded with crusty debris.   IMPRESSION:  Probable improvement in stasis wound left lower extremity.   DISPOSITION:  The wound is selectively debrided by removing a lot of  this crusty material from the wound base and indeed once that is done  the wound appears to be essentially healed.   It is then dressed with an application of Iodosorb gel directly into  that wound and the extremity is then returned to an Unna wrap.   It is hoped that with continued improvement, he can be released from  care next week.           ______________________________  Jonelle Sports. Cheryll Cockayne, M.D.     RES/MEDQ  D:  09/20/2007  T:  09/21/2007  Job:  [id number]

## 2010-10-06 NOTE — Assessment & Plan Note (Signed)
[facility]   NAME:  [name] [name], [name] [name]                ACCOUNT NO.:  [id number]   MEDICAL RECORD NO.:  [id number]      DATE OF BIRTH:  11-Mar-1921   PHYSICIAN:  Theresia Majors. Tanda Rockers, M.D. VISIT DATE:  04/24/2007                                   OFFICE VISIT   SUBJECTIVE:  Mr. Lazarz is an 77 year old man with a recalcitrant  stasis ulcer on the medial aspect of the left medial lower extremity.  In the interim, we have treated the patient with a wound VAC.  His  protracted course has been complicated by the concurrent administration  of methotrexate and corticosteroids for severe rheumatoid arthritis.  There has been no interim fever.  There has been moderate swelling.  There is some increased soreness.  The patient continues to be  ambulatory.   OBJECTIVE:  VITAL SIGNS:  Stable.  The patient is afebrile.   Inspection of the left lower extremity shows that there is 2-3+ edema  with taut shiny skin. There is no ascending lymphangitis.  The capillary  refill is brisk.  The foot is warm, but it is not feverish.  The wound  itself has a soft waxy exudate which was mechanically debrided with a  4x4. There is evidence of granulation tissue underneath the exudate.  There is no tracking. There is no abscess and no evidence of an invasive  infection.  There is no concurrent ischemia.   ASSESSMENT:  Persistent stasis ulcer.   PLAN:  We are discontinuing the wound VAC.  There does not seem to be  any therapeutic advantage to this appliance at this point, and the  patient is complaining of his cumbersomeness.  We will resume use of a  multilayered compressive wrap, and we will reevaluate the patient in 4  days.      Harold A. Tanda Rockers, M.D.  Electronically Signed     HAN/MEDQ  D:  04/24/2007  T:  04/24/2007  Job:  [id number]

## 2010-10-06 NOTE — Assessment & Plan Note (Signed)
[facility]   NAME:  HARLES, EVETTS NO.:  [id number]   MEDICAL RECORD NO.:  [id number]      DATE OF BIRTH:  1921/05/11   PHYSICIAN:  Maxwell Caul, M.D.      VISIT DATE:                                   OFFICE VISIT   We have been following Mr. Farrugia for a very refractory ulceration on  the medial aspect of his left ankle.  We have been seeing him for two  week intervals most recently applied Aquacell AG and an Unna wrap.  I  saw him today in follow up.  He does not report any undue pain, swelling  or fever, although he is expressing some frustration about the wound.   On examination temperature is 98.2, pulse 78, respirations 18, blood  pressure 124/72.  I removed some slough from the wound bed using a Q-tip  gauze once again.  The base of this wound appears healthy,  however, it  is a fairly deep wound measuring 1.1 x 0.4 x 0.3.  There is really no  evidence of healing or any change in this wound in spite of many  attempts including Oasis and most recently an Apligraf at the end of  August.  The wound appears dry.  I am going to apply a Silver hydrogel  and adherent dressing and continue the Unna wrap.  I will see this again  in a week to see if we can come to a more effective mode of treatment  here.           ______________________________  Maxwell Caul, M.D.     MGR/MEDQ  D:  04/07/2007  T:  04/08/2007  Job:  [id number]

## 2010-10-06 NOTE — Assessment & Plan Note (Signed)
[facility]   NAME:  [name] [name], DAHM NO.:  [id number]   MEDICAL RECORD NO.:  [id number]      DATE OF BIRTH:  1920/08/20   PHYSICIAN:  Maxwell Caul, M.D. VISIT DATE:  03/24/2007                                   OFFICE VISIT   [name] returns today for review of the refractory area on his  medial aspect of his left ankle.  We have been seeing him at 2-week  intervals and applying double prism and an Unna wrap for most of the  last 4 to 6 weeks.  I had some inkling that this may be improving.  However, today I am disappointed. Measurements of the wound are 1.1 x  0.4 x 0.4.   PHYSICAL EXAMINATION:  VITAL SIGNS:  On examination temperature is 98.2,  pulse 82, respirations 18.  Blood pressure is 128/67.  EXTREMITIES:  The wound bed is pink but generally pale and fairly dry.  It does not appear to be overtly infected.  I did remove some tan-  colored eschar with a Q-tip. The base of this looks completely  unchanged.  It is a tiny wound, but deep. I do not believe there is any  active ischemia here.   IMPRESSION:  Left medial ankle ulcer.  I have applied Aquacel Ag  wondering about the colonization of the wound.  We have rewrapped again  with an Unna wrap.  We will see him again in 2-week intervals.           ______________________________  Maxwell Caul, M.D.     MGR/MEDQ  D:  03/24/2007  T:  03/25/2007  Job:  [id number]

## 2010-10-06 NOTE — Consult Note (Signed)
NAME:  [name] [name], FALERO NO.:  [id number]   MEDICAL RECORD NO.:  [id number]          PATIENT TYPE:  REC   LOCATION:  [facility]                         FACILITY:  [facility]   PHYSICIAN:  Jonelle Sports. Sevier, M.D. DATE OF BIRTH:  08-Dec-1920   DATE OF CONSULTATION:  12/30/2006  DATE OF DISCHARGE:                                 CONSULTATION   HISTORY:  This 77 year old black male with longstanding refractory  chronic venous disease and ulcerations of the left lower extremity is  seen today in follow-up with one persistent lesion on the gaiter area of  the left ankle.  He was seen most recently by Dr. Leanord Hawking who has tried a  series of treatments with Oasis as well as Regranex neither which is  made a difference.  He had advocated possible use of Apligraf.   The patient reports no increased problems with that ulcer since last  visit, but that it has not progressed to healing.  He is had no fever or  systemic symptoms either.   Blood pressure 130/77, pulse 84, respirations 16, temperature not  recorded.   On the medial supramalleolar (gaiter) area of the left ankle, it is  indeed a deep punched out ulcer measuring 1.0 x 0.4 x 0.5 cm with rather  shaggy margins and some fibrinous exudate in the base.   IMPRESSION:  Refractory venostasis ulceration left lower extremity.   DISPOSITION:  The margins of this wound are debrided and some of the  deep slough is sharply debrided as well.  It looks much better following  that.   This wound, however, is not yet ready for our Apligraf, and accordingly,  our approach will be to fill the wound with Panafil ointment, cover it  with a dry dressing, and to have the patient cleanse it gently and  reapply the Panafil and dry dressing every 2 days.  Meanwhile,  application will be made for coverage for Apligraf, and the patient will  be brought back in 2 weeks by which time hopefully the material will be  made available and the ulcer will be  ready for such a procedure.           ______________________________  Jonelle Sports. Cheryll Cockayne, M.D.     RES/MEDQ  D:  12/30/2006  T:  12/31/2006  Job:  [id number]

## 2010-10-06 NOTE — Assessment & Plan Note (Signed)
[facility]   NAME:  TAMEEM, PULLARA                ACCOUNT NO.:  [id number]   MEDICAL RECORD NO.:  [id number]      DATE OF BIRTH:  [birth year]   PHYSICIAN:  Theresia Majors. Tanda Rockers, M.D. VISIT DATE:  09/06/2007                                   OFFICE VISIT   SUBJECTIVE:  Mr. Hurwitz is an 77 year old man whom we followed for  stasis ulcer involving the medial left ankle.  In the interim, he has  been treated with silver gel packing and an ulna boot.  He has seen  significant contraction of the wound.  He continues to be ambulatory.  There is no drainage and no pain.   OBJECTIVE:  Blood pressure is 128/69, respirations 18, pulse rate 73,  temperature 98.3.  Inspection of the left medial ankle shows that these  changes of stasis are persistent.  There is trace edema.  The wound  itself has in fact decreased significantly.  There is some desquamation,  but no debridement is needed.  There is no evidence of ascending  infection.  The foot is warm.  There is no evidence of ischemia.   ASSESSMENT:  Clinical improvement, responding to silver dressing and  compression.   PLAN:  We will continue the silver dressing and compression with the  nurse, evaluate the patient weekly, and the physician will reevaluate  the patient every 2 weeks.      Harold A. Tanda Rockers, M.D.  Electronically Signed     HAN/MEDQ  D:  09/06/2007  T:  09/07/2007  Job:  [id number]

## 2010-10-06 NOTE — Op Note (Signed)
NAME:  [name] [name], [name] [name] NO.:  [id number]   MEDICAL RECORD NO.:  [id number]          PATIENT TYPE:  AMB   LOCATION:  [facility]                          FACILITY:  [facility]   PHYSICIAN:  Cindee Salt, M.D.       DATE OF BIRTH:  11-14-1920   DATE OF PROCEDURE:  01/31/2008  DATE OF DISCHARGE:                               OPERATIVE REPORT   PREOPERATIVE DIAGNOSES:  1. Synostosis, radius and ulna, right arm.  2. Carpal tunnel syndrome, right hand.  3. Rupture superficialis profundus, right ring, right little finger.  4. Hamate hook nonunion, right hand.   POSTOPERATIVE DIAGNOSES:  1. Synostosis, radius and ulna, right arm.  2. Carpal tunnel syndrome, right hand.  3. Rupture superficialis profundus, right ring, right little finger.  4. Hamate hook nonunion, right hand.   OPERATION:  1. Excision synostosis radius to ulna.  2. Carpal tunnel release.  3. Excision hamate hook nonunion with palmaris longus tendon graft to      FDP, right ring, and right little finger.   SURGEON:  Cindee Salt, MD   ASSISTANT:  Artist Pais. Mina Marble, MD   ANESTHESIA:  Axillary block general.   ANESTHESIOLOGIST:  Zenon Mayo, MD   HISTORY:  The patient is an 77 year old male with a history of an old  fracture of his ulna.  This has gone onto a synostosis with limited  pronation and supination.  He has noted the inability to flex his ring  finger.  He has had a year history of inability to flex his little  finger.  He is referred by Dr. Marjory Lies for consultation with  respect to this.  MRI reveals that the tendons are intact distally and  hamate hook fracture, a nonunion is noted.  Synostosis reveals the  tendons intact proximally.  EMG nerve conductions reveal carpal tunnel  syndrome consistent with his physical findings of numbness and tingling.  He is desirous of resection of the hamate hook, nonunion  carpal tunnel  release, repair of the flexor tendons with excision of the  synostosis  from his right arm.  He is aware of risks and complications.  There is  no guarantee with the surgery; possibility of infection; recurrence;  injury to arteries, nerves, tendons; incomplete relief of symptoms;  dystrophy; re-formation of bone; rupture of the flexor tendons; loss of  mobility to the fingers; persistent numbness and tingling.   Preoperative area the patient is seen.  The extremity marked by both the  patient and surgeon.  Antibiotic given.  A block was given by Dr.  Sampson Goon.  He was prepped using DuraPrep, supine position with the  right arm free.  He did have feeling, a LMA general anesthetic was then  given.  He was prepped using DuraPrep, supine position.  The limb time-  out was taken.  The limb was exsanguinated with an Esmarch bandage.  Tourniquet placed high on the arm was inflated to 150 mmHg.  The  synostosis was approached, first volar incision made just to the dorsum  of the flexor carpi radialis carried down through  subcutaneous tissue.  Dissection was then carried to the  ulnar side of the flexor carpi  radialis, prone flexor to the thumb was identified along with the  flexors to the fingers along with median nerve.  These were protected.  Retractors placed.  Dissection carried down.  The anterior interosseous  artery was coagulated.  The synostosis was then isolated with blunt  sharp dissection.  This traversed the entire bone which was  approximately 7-1/2 cm in length.  This was confirmed with [facility] image  intensification.  With an osteotome, the margins of the radius were then  identified and the synostosis osteotomized from the radial connection.  This was removed in pieces with a rongeur taking care to remove any  small fragments of bone left behind.  The ulna was then isolated.  Osteotomes were then used to isolate the synostosis bone.  This was then  removed with the osteotome and with rongeurs.  X-rays confirmed complete  removal.  Full  pronation, supination was afforded to the patient and a  neutral position of the elbow.  This was then copiously irrigated with  saline and all bone fragments were removed.  The area packed with  Gelfoam, bleeders electrocauterized.  The subcutaneous tissue was closed  with interrupted 4-0 Vicryl and skin with interrupted 4-0 Vicryl Rapide  sutures.  A separate incision was then made for carpal tunnel release,  carried down through subcutaneous tissue.  Bleeders again  electrocauterized.  This was longitudinal in the palm.  The median nerve  was identified.  Incision was made.  Retractors placed and an incision  through the carpal retinaculum made to the ulnar side of the median  nerve.  The wound was extended across the wrist joint to allow  visualization of the ruptured flexor tendons.  These were noted.  The  hamate hook was immediately apparent.  With blunt and sharp dissection,  the hamate hook nonunion site was removed.  The remainder of the hamate  hook was removed taking care to protect the ulnar artery, ulnar nerve,  and the terminal branch of the ulnar nerve as it passed around the  hamate hook, each was preserved.  The hamate hook was excised in total.  The wound was then extended distally.  Brunner incision in the flexor  tendons to the ring and little finger were found just proximal to the A1  pulley.  The A1 pulleys were released to allow placement of a Pulvertaft  weave.  The palmaris longus was then harvested through two incisions.  This was then inserted into the profundus tendon of each of the ring and  little fingers separately, brought back to the superficialis of the ring  finger and sutured in with a 3-0 FiberWire maintaining the finger in a  slightly greater flexion than the index and middle.  The little finger  was sutured into the profundus stump with a Pulvertaft weave again with  3-0 FiberWire sutures.  This was done with the little finger slightly  flexed  compared to the ring finger.  Wounds were copiously irrigated  with saline.  The skin was then closed with interrupted 4-0 Vicryl  Rapide sutures.  Bleeders were electrocauterized with bipolar.  Neurovascular structures were protected throughout the procedure.  A  dorsal palmar splint was applied with the wrist flexed with the fingers  in a flexed position to remove any tension from the repair sites.  A  Munster splint was applied.  Deflation of the tourniquet, all fingers  immediately pink.  He was taken to the recovery room for observation in  satisfactory condition.  He will be admitted for overnight stay for pain  control.  He will be discharged on Percocet and Celebrex along with his  remaining medicines.  He will return to the office in 1 week for  mobilization.           ______________________________  Cindee Salt, M.D.     GK/MEDQ  D:  01/31/2008  T:  02/01/2008  Job:  [id number]   cc:   Marjory Lies, M.D.  Cindee Salt, M.D.

## 2010-10-06 NOTE — Assessment & Plan Note (Signed)
[facility]   NAME:  IZZAK, [name] [name]                ACCOUNT NO.:  [id number]   MEDICAL RECORD NO.:  [id number]      DATE OF BIRTH:  1921/05/02   PHYSICIAN:  Jonelle Sports. Sevier, M.D.  VISIT DATE:  09/27/2007                                   OFFICE VISIT   HISTORY:  This is an 77 year old black male who was followed for 2  venous stasis ulcerations on the left lower extremity, one in the medial  malleolar area and the other one in the medial aspect of the knee.  He  has most recently been in left lower extremity Unna wrap.   He reports some slight sensation of pain in the area this week, but has  had no awareness of increased drainage or odor and he has had no fever,  chills, or systemic symptoms.   PHYSICAL EXAMINATION:  VITAL SIGNS: Blood pressure 128/70, pulse of 68  and regular, respirations 18, temperature 97.9.   The wound on the medial aspect of the left heel measures 0.8 x 0.2 x 0.2  cm and has a clean base.   The wound at the left medial malleolar area has a blister on the surface  and be seen to be draining a small amount of seropurulent fluid.  It has  no apparent odor.   IMPRESSION:  Probable secondary infection (that is abscess) of venous  ulcer at malleolar area with satisfactory progress of the second ulcer.   DISPOSITION:  The ankle wound is cultured today and is further debrided  sharply to remove the blister and indeed the margins.  The wounds are  found to be undermined and there is significant evidence of a small  abscess pouch.  This is well cleaned out and then irrigated with saline  and the wound then packed with an iodoform gauze.  This is covered with  an absorptive pad.  The distal wound is treated with an application of  Iodosorb gel, covered with a small dry pad, and the entire ankle is  placed in a light bulky wrap.  He will not be returned to a compressive  wrap at this time.   Follow up visit will be here in 2 days when  hopefully, we will have some  culture information and in addition, if the wound is cleaning up he can  likely undercover the antibiotics, go back on to regular wraps.   He is begun on doxycycline 100 mg p.o. b.i.d., pending the results of  culture.           ______________________________  Jonelle Sports Cheryll Cockayne, M.D.     RES/MEDQ  D:  09/27/2007  T:  09/28/2007  Job:  [id number]

## 2010-10-06 NOTE — Assessment & Plan Note (Signed)
[facility]   NAME:  BARRE, AYDELOTT NO.:  [id number]   MEDICAL RECORD NO.:  [id number]      DATE OF BIRTH:  05/26/1920   PHYSICIAN:  Maxwell Caul, M.D. VISIT DATE:  07/28/2007                                   OFFICE VISIT   Mr. [name] [name] returns today for follow-up of his refractory area of  ulceration on the medial aspect of his left leg.  This is in the setting  of venostasis and ongoing treatment for rheumatoid arthritis.  Most  recently I have been packing this with a silver gel and plain packing.  We have continued to wrap this.  He has tolerated this reasonably well  and we have been following him at 2-week intervals.   The area on his leg has been refractory to a vast majority of treatment  modalities including Apligraf's, oasis, at one point this was even  opened and a wound vac was applied.  None of this has caused any  continuing of any ongoing healing.   IMPRESSION:  Refractory venostasis.  We have continued with silver  shield gel, a plain packing and an Unna wrap.  There is really no  evidence that this has changed in many months.           ______________________________  Maxwell Caul, M.D.     MGR/MEDQ  D:  07/28/2007  T:  07/28/2007  Job:  [id number]

## 2010-10-06 NOTE — Assessment & Plan Note (Signed)
[facility]   NAME:  [name] [name], [name] [name] NO.:  [id number]   MEDICAL RECORD NO.:  [id number]      DATE OF BIRTH:  22-Apr-1921   PHYSICIAN:  Maxwell Caul, M.D. VISIT DATE:  02/03/2007                                   OFFICE VISIT   REASON FOR VISIT:  Follow-up of refractory area of ulceration on the  left lower extremity in the medial aspect.  He had an Apligraf placed  now 3 weeks ago.  Unfortunately, once again the wound appears covered by  a tan colored eschar that he so frequently has.  I did debride this  today down to the same wound I am used to seeing which is clean base  without ever evidence of infection.  However there is no evidence of  healing here.   I applied double prisma hydrogel __________ to this wound.  I have  believe we have been through the gambit of possibilities here including  an Oasis, Regranex, and most recently an Apligraf.  Optimistically it  would still seem possible that the Apligraf will have some healing  although I am somewhat discouraged today.           ______________________________  Maxwell Caul, M.D.     MGR/MEDQ  D:  02/03/2007  T:  02/04/2007  Job:  [id number]

## 2010-10-06 NOTE — Assessment & Plan Note (Signed)
[facility]   NAME:  [name] [name], [name] [name] NO.:  [id number]   MEDICAL RECORD NO.:  [id number]      DATE OF BIRTH:  [birth year]   PHYSICIAN:  Maxwell Caul, M.D. VISIT DATE:  06/02/2007                                   OFFICE VISIT   PURPOSE OF TODAY'S VISIT:  Follow up of refractory venous stasis ulcer  on the medial left leg.  The patient most recently had a silver wound  gel impregnated packing and an Unna wrap.  He returns today in followup.   PHYSICAL EXAMINATION:  On examination temperature is 98, pulse 86,  respirations 20, blood pressure 111/69.   The wound measures 1.1 x 0.6 x 0.4.  This is a small but deep wound.  There is an attempt at epithelialization in part of this wound that is  actually going into the wound itself, along one of its wall.  Although  this is not an ideal situation for healing, at this point, if it would  epithelialize in this fashion; I would be satisfied.   IMPRESSION:  Very refractory venous stasis ulceration.  This has been  refractory to all modalities that we have tried which is an extensive  list at this point.  I am continuing with the silver gel packing and the  Unna wrap.  We will see him again in 2 weeks.           ______________________________  Maxwell Caul, M.D.     MGR/MEDQ  D:  06/02/2007  T:  06/02/2007  Job:  [id number]

## 2010-10-06 NOTE — Assessment & Plan Note (Signed)
[facility]   NAME:  [name] [name], SCHNOOR NO.:  [id number]   MEDICAL RECORD NO.:  [id number]      DATE OF BIRTH:  08/16/1920   PHYSICIAN:  Maxwell Caul, M.D. VISIT DATE:  03/10/2007                                   OFFICE VISIT   PURPOSE OF TODAY'S VISIT:  Followup of refractory venous stasis ulcer on  the medial aspect of his left ankle.  We last saw him 2 weeks ago with  double Prisma and an Unna wrap.  He reports no excessive pain, drainage,  malodor, or fever.  He did have pain in his left knee for which he has  received a steroid shot.  However, I think this was all unrelated.   EXAMINATION:  Temperature is 98.4, pulse 92, respirations 16, blood  pressure 132/71.  The area in question actually looks considerably better than what I have  seen.  It is now at 50% epithelialized without anything that need to be  debrided and no evidence of infection.   IMPRESSION:  Refractory venous stasis ulceration.  I think this is  finally making progress.  The wound looks slightly dry.  Therefore, we  will applied double Prisma, hydrogel, and re-wrap in an Unna.  We will  see him and in 2 weeks.  He appears to be tolerating this degree between  visits.           ______________________________  Maxwell Caul, M.D.     MGR/MEDQ  D:  03/10/2007  T:  03/11/2007  Job:  [id number]

## 2010-10-06 NOTE — Assessment & Plan Note (Signed)
[facility]   NAME:  [name], [name]                ACCOUNT NO.:  [id number]   MEDICAL RECORD NO.:  [id number]     DATE OF BIRTH:  [birth year]   PHYSICIAN:  Theresia Majors. Tanda Rockers, M.D.      VISIT DATE:                                   OFFICE VISIT   SUBJECTIVE:  [name] [name] is an 77 year old man who we have treated for a  stasis ulcer of the left medial ankle and most recently he had an  abscess developed in this area, which was successfully drained.  In the  interim, he has had a course of antibiotics for which he has 2 days  left.  There has been no interim drainage.  We have continued to use an  iodoform packing and an Radio broadcast assistant.  He continues to be ambulatory.  There has been no pain, excessive drainage, or malodor.   OBJECTIVE:  VITAL SIGNS:  Blood pressures is 124/75, respirations 16,  pulse rate 73, and temperature 98.1.  Inspection of the left medial ankle shows that the wound continues to  contract.  There is no evidence of drainage or malodor at this point.The  foot is warm.  There is no evidence of infection or ischemia.   ASSESSMENT:  Clinical improvement.  No evidence of active infection.   PLAN:  We will turn in the patient to the [facility] boot.  We will reevaluate  him in a week.      Harold A. Tanda Rockers, M.D.  Electronically Signed     HAN/MEDQ  D:  10/06/2007  T:  10/07/2007  Job:  [id number]

## 2010-10-06 NOTE — Op Note (Signed)
NAME:  [name] [name], [name] [name] NO.:  [id number]   MEDICAL RECORD NO.:  [id number]          PATIENT TYPE:  AMB   LOCATION:  DSC                          FACILITY:  [facility]   PHYSICIAN:  Cindee Salt, M.D.       DATE OF BIRTH:  [birth year]   DATE OF PROCEDURE:  11/26/2008  DATE OF DISCHARGE:                               OPERATIVE REPORT   PREOPERATIVE DIAGNOSIS:  Status post flexor tendon graft for rupture of  superficialis profundus, ring and little fingers.   POSTOPERATIVE DIAGNOSIS:  Status post flexor tendon graft for rupture of  superficialis profundus, ring and little fingers.   OPERATION:  Tenolysis and excision of lumbricals, ring and little  finger, right hand.   SURGEON:  Cindee Salt, MD   ASSISTANT:  Carolyne Fiscal, RN   ANESTHESIA:  Axillary block.   ANESTHESIOLOGIST:  Kaylyn Layer. Michelle Piper, MD.   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  He has undergone release of excision of  the hamate hook with tendon grafting, ring and little fingers.  He has  not developed full flexion.  He is desirous of attempting to regain as  much as possible.  He is aware of risks and complications including  infection, recurrence injury to arteries, nerves, and tendons,  incomplete relief of symptoms, dystrophy, and potential loss of  mobility.  In the preoperative area, the patient is seen.  The extremity  is marked by both the patient and surgeon.  Antibiotic is given.   PROCEDURE:  The patient was brought to the operating room where an  axillary block was carried out without difficulty.  He was prepped using  ChloraPrep, supine position with the right arm free.  A time-out was  taken.  A 3-minute dry time was allowed.  The patient and procedure was  confirmed.  He was then draped.  The limb was exsanguinated with an  Esmarch bandage.  Tourniquet placed high  on the arm was inflated to 250  mmHg.  The old incision was used, Lorrene [name] in nature and carried down  through the subcutaneous tissue.  Bleeders were electrocauterized.  Neurovascular structures were identified and protected.  The flexor  tendon to the ring and little finger were identified along with the  graft.  Suture knots were removed.  Significant scarring was present  between each of the tendon and the surrounding tissue with blunt and  sharp dissection and this was dissected free.  The wound was extended  proximally.  The fingers were able to be fully flexed passively with  traction on the tendons.  The fingers were able to be brought down into  the palm.  A tenolysis was performed from the A1 pulley through the  carpal canal proximally.  These were isolated proximal to the carpal  canal and a tenolysis was performed through the entire aspect  proximally.  Operative time was slightly over 1 hour.  The lumbrical  muscle to the little finger and  ring finger was then excised making  certain that there was no reciprocal problems.  The wound was copiously  irrigated with saline.  This was then infiltrated with Celestone.  The  area was locally infiltrated with 0.25% Marcaine without epinephrine and  the wound was closed over a drain with interrupted 4-0 Vicryl Rapide  sutures.  A sterile compressive dressing to the wrist along with splint  was applied.  On deflation of the tourniquet, all fingers  immediately pinked and was taken to the recovery room for observation in  satisfactory condition.  His fingers with flexion/extension of his wrist  came down nearly to his palm.  The patient tolerated the procedure well.  He will be discharged home to return the [facility] [place] in 1  week on Vicodin.           ______________________________  Cindee Salt, M.D.     GK/MEDQ  D:  11/26/2008  T:  11/26/2008  Job:  [id number]   cc:   Marjory Lies, M.D.

## 2010-10-06 NOTE — Assessment & Plan Note (Signed)
[facility]   NAME:  KAHLEEL, FADELEY                ACCOUNT NO.:  [id number]   MEDICAL RECORD NO.:  [id number]      DATE OF BIRTH:  1921-05-23   PHYSICIAN:  Theresia Majors. Tanda Rockers, M.D. VISIT DATE:  02/13/2007                                   OFFICE VISIT   SUBJECTIVE:  Ms. Rudy is an 77 year old man with an ulceration on the  medial aspect of his left ankle.  In the interim, we have treated him  with compression wrap and double Prisma.  There has been no excessive  drainage, malodor pain or fever.   OBJECTIVE:  Blood pressure is 120/63, respirations 16, pulse rate of 79,  temperature 91.  Inspection of the left ankle shows that the wound has  actually decreased somewhat with less volume.  The wound was measured,  photographed and cataloged.  There is no evidence of ischemia or  ascending infection.  The edema is reasonably controlled as manifested  by linear wrinkles.   IMPRESSION:  Clinical improvement of stasis ulcer.   PLAN:  We will continue the compression wrap and reevaluate the patient  in 1 week.      Harold A. Tanda Rockers, M.D.  Electronically Signed     HAN/MEDQ  D:  02/13/2007  T:  02/14/2007  Job:  [id number]

## 2010-10-06 NOTE — Assessment & Plan Note (Signed)
[facility]   NAME:  HORST, OSTERMILLER NO.:  [id number]   MEDICAL RECORD NO.:  [id number]      DATE OF BIRTH:  May 14, 1921   PHYSICIAN:  Maxwell Caul, M.D. VISIT DATE:  12/16/2006                                   OFFICE VISIT   Mr. Mcclaren returns today for a follow-up of his very refractory area on  the left medial ankle.  He has now been on Oasis protocol for 3 weeks.  He continues on methotrexate for rheumatoid arthritis.   EXAMINATION:  His temperature is 98.3, pulse 72, respirations 18, blood  pressure 130/70.  The wound itself is as usual, has a granulating base  however this has not changed, currently measuring 1 x 0.3 x 0.2.  I am  going to reapply the Oasis although it does not appear that we are  making a large improvement here.  I wonder whether we can consider a  small Apligraf placement at some point.  I believe he has tried Regranex  in the past.  He will be followed in 1 week.           ______________________________  Maxwell Caul, M.D.     MGR/MEDQ  D:  12/16/2006  T:  12/17/2006  Job:  [id number]

## 2010-10-06 NOTE — Assessment & Plan Note (Signed)
[facility]   NAME:  KRISTOFOR, MICHALOWSKI                ACCOUNT NO.:  [id number]   MEDICAL RECORD NO.:  [id number]      DATE OF BIRTH:  [birth year]   PHYSICIAN:  Theresia Majors. Tanda Rockers, M.D. VISIT DATE:  10/27/2007                                   OFFICE VISIT   SUBJECTIVE:  Mr. Sudol is an 77 year old man who we have followed for  a stasis ulcer involving the left medial ankle.  In the interim, we have  treated him with an Unna wrap.  There has been no drainage and no  excessive swelling.  He continues to be ambulatory.   OBJECTIVE:  Blood pressure is 122/72, respirations 18, pulse rate 80 and  regular.  He is afebrile.  Inspection of the left medial ankle shows  that the indentation is persistent but this wound is completely re-  epithelialized.  There is trace edema.  There is chronic changes of  stasis.  Capillary refill is brisk.   ASSESSMENT:  Resolved stasis ulcer.   PLAN:  We are returning the patient to an Ace wrap with topical  triamcinolone cream and a 4x4 heat, bring his stockings with him.  He  has been instructed to resume the use of his bilateral below-the-knee 30-  40 mm compression hose.  We will reevaluate him on a p.r.n. basis.  He  is discharged to continue his routine medical care with his primary care  physician.      Harold A. Tanda Rockers, M.D.  Electronically Signed     HAN/MEDQ  D:  10/27/2007  T:  10/27/2007  Job:  [id number]

## 2010-10-06 NOTE — Assessment & Plan Note (Signed)
[facility]   NAME:  IZEYAH, DEIKE NO.:  [id number]   MEDICAL RECORD NO.:  [id number]      DATE OF BIRTH:  August 27, 1920   PHYSICIAN:  Maxwell Caul, M.D. VISIT DATE:  01/13/2007                                   OFFICE VISIT   SUBJECTIVE:  Mr. Sebring was seen today in follow-up from his very  refractory chronic venous ulceration on the left lower extremity.   OBJECTIVE:  His wound measured 1 x 2 x 0.4 x 0.5.  This did not require  debridement.  The base is granulated.  We applied an Apligraf to the  base of the wounds.  Covered this with hydrogel gauze and then Mepitel  and a nonadherent dressing.  We then put him in an Radio broadcast assistant.  We will  review this whole situation again in a week.   IMPRESSION:  Venous stasis ulcer.  Apligraf applied as described above  today.           ______________________________  Maxwell Caul, M.D.     MGR/MEDQ  D:  01/13/2007  T:  01/14/2007  Job:  [id number]

## 2010-10-06 NOTE — Consult Note (Signed)
NAME:  [name] [name], [name] [name] NO.:  [id number]   MEDICAL RECORD NO.:  [id number]          PATIENT TYPE:  REC   LOCATION:  FOOT                         FACILITY:  [facility]   PHYSICIAN:  Jonelle Sports. Sevier, M.D. DATE OF BIRTH:  04-09-1921   DATE OF CONSULTATION:  04/17/2007  DATE OF DISCHARGE:                                 CONSULTATION   HISTORY:  This 77 year old black male has a rather chronic stasis  ulceration on the medial malleolar area of the left lower extremity.  The healing of this has been compromised by virtue of the fact that he  is on methotrexate and prednisone for a hematologic problem.   He reports that recently his prednisone has been slightly reduced but  that he does, of course, continue the methotrexate.  The wound has been  treated most recently with a wound VAC, but that has been poorly  tolerated in that the leg tends to swell, and then he loses the seal on  his wound VAC.  Accordingly, there has been very little accumulation of  drainage in the vacuum container.  He is here today a little bit  frustrated with things, and it is hoped we can make some reasonable  adjustment.   PHYSICAL EXAMINATION:  VITAL SIGNS:  Not available to me at the moment  but have not been a major issue in this gentleman.   The wound on the left medial malleolar area now measures 1.0 x 0.5 x 0.3  mm in depth and has some maceration around its margins and a slight  grayness to its base.   There is some edema of that extremity, particularly quite puffy distal  to the area of the wound and immediately proximal to it and generally  fairly tensely edematous otherwise.   IMPRESSION:  Chronic stasis ulceration, left lower extremity, with  healing significantly impaired by medication-related immunocompromise.   DISPOSITION:  1. The wound is sharply debrided selectively of the macerated tissue      around its margins.  The wound base, though a bit grayish, does not      seem to  have separable slough and requires no debridement.  2. Following this, the patient will be returned to a wound VAC, and      that extremity will be placed in a Profore wrap in an effort to      control the edema and lead to more satisfactory function on the      VAC.  Hopefully this will be satisfactory and unsuccessful.  3. In addition, the patient has on hand 20 mg of furosemide which he      does not use on an ongoing basis, and he is encouraged to use those      one every other day for three doses over the next 6 days so that we      may see his progress when he is seen again here in 7 days for      routine revisit.           ______________________________  Jonelle Sports. Cheryll Cockayne, M.D.  RES/MEDQ  D:  04/17/2007  T:  04/17/2007  Job:  [id number]

## 2010-10-06 NOTE — Assessment & Plan Note (Signed)
[facility]   NAME:  [name] [name], [name] [name]                ACCOUNT NO.:  [id number]   MEDICAL RECORD NO.:  [id number]      DATE OF BIRTH:  03/15/1921   PHYSICIAN:  Jonelle Sports. Sevier, M.D.       VISIT DATE:                                   OFFICE VISIT   HISTORY:  This 77 year old black male has been followed for a tiny  stasis ulceration on the left medial malleolar area which most recently  has been treated with benefit by application of Silver Shield gel, small  packing in the wound and an Unna wrap.   Since his last visit a week ago, the patient reports no new problems.  Having seen the wound follow removal of the cast, he feels that it is  improved.  He has had certainly no untoward symptoms related to the  wound or otherwise, and has had no interim illness.  Blood pressure  135/81, pulse 79, respirations 18, temperature 98.1.  The small wound at  the left medial malleolar area now measures 1.0 x 0.5 x 0.1 cm and has a  clean base.   IMPRESSION:  Stasis ulceration, left medial malleolar area improved.   DISPOSITION:  The wound is again treated with an application of Silver  Shield gel, a gauze packing and the area is returned to an Associate Professor.   Follow-up visit is set for 2 weeks.           ______________________________  Jonelle Sports Cheryll Cockayne, M.D.     RES/MEDQ  D:  08/23/2007  T:  08/24/2007  Job:  [id number]

## 2010-10-06 NOTE — Assessment & Plan Note (Signed)
[facility]   NAME:  TAG, WURTZ NO.:  [id number]   MEDICAL RECORD NO.:  [id number]      DATE OF BIRTH:  [birth year]   PHYSICIAN:  Maxwell Caul, M.D. VISIT DATE:  06/16/2007                                   OFFICE VISIT   Mr. [name] [name] returns today of refractory area venous stasis on the medial  aspect of his left leg.  This is complicated by methotrexate treatment  for his rheumatoid arthritis.  This has been a refractory area to almost  every treatment option we have considered including Apligraf, Regranex,  etc.  There has been no evidence of infection.  Most recently there has  been some epithelialization progressing into the wound from the surface  but overall this wound has not changed appreciably in the many months  that I have been reviewing this.   EXAMINATION:  GENERAL:  The patient appears well.  His rheumatoid is  under fairly good control.  Temperature is 98.2, pulse 90, respirations  18, blood pressure 112/62.  The wound measurements are 1.5 x 0.7 x 0.4.  This is a small but deep wound and has not really changed much.  There  is still an attempted epithelialization in part of the wound actually  coming from the surface.  There has been no appreciable improvement in  the depth of the wound.  There is less eschar than I am used to seeing  several months ago, but all all-in-all this is a stagnant situation.   IMPRESSION:  Refractory venous stasis ulceration related probably to  concomitant use of methotrexate.  We have discussed this and the patient  with detail.  We will change the wound to palliative care status.  For  now I am continuing a silver gel packing and an Unna wrap.  There is no  evidence of infection here in the wound or surrounding tissue.  We will  see him again in 2 weeks.  At some point in time I think it might be  reasonable to see if he can wrap this himself although his recent arm  fracture may hamper  this.           ______________________________  Maxwell Caul, M.D.     MGR/MEDQ  D:  06/16/2007  T:  06/16/2007  Job:  [id number]

## 2010-10-06 NOTE — Assessment & Plan Note (Signed)
[facility]   NAME:  SUMEET, GETER                ACCOUNT NO.:  [id number]   MEDICAL RECORD NO.:  [id number]      DATE OF BIRTH:  04/25/1921   PHYSICIAN:  Theresia Majors. Tanda Rockers, M.D. VISIT DATE:  04/13/2007                                   OFFICE VISIT   SUBJECTIVE:  Mr. Acy is an 77 year old man who we have followed for  several months for an ulceration on the medial aspect of his left ankle.  He continues to have scant drainage and minimum to no pain.  He  continues on his prednisone as well as methotrexate.  There has been no  interim fever.   OBJECTIVE:  Blood pressure is 110/82, respirations 16, pulse rate 82,  temperature is 98.5.  Inspection of the left lower extremity shows that  there is trace edema.  The ulcer itself had shaggy exudates which  underwent a sharp excisional debridement utilizing a wound curette.  The  resulting hemorrhage was controlled with direct pressure.  There is no  evidence of critical ischemia or ascending infection.   ASSESSMENT:  Persistent cavitary ulceration consistent with a stasis  ulcer.   PLAN:  We will apply a wound VAC to this wound to control the drainage  and to effect closure.  We will reevaluate the patient in 1 week.      Harold A. Tanda Rockers, M.D.  Electronically Signed     HAN/MEDQ  D:  04/13/2007  T:  04/13/2007  Job:  [id number]

## 2010-10-06 NOTE — Assessment & Plan Note (Signed)
[facility]   NAME:  ELIC, VENCILL NO.:  [id number]   MEDICAL RECORD NO.:  [id number]           DATE OF BIRTH:   PHYSICIAN:  Maxwell Caul, M.D. VISIT DATE:  07/14/2007                                   OFFICE VISIT   Mr. [name] [name] returns today in follow-up for a refractory ulcer on the  medial aspect of his left leg.  This is in the setting of significant  venous stasis.  Issues of nonhealing are complicated by chronic use of  methotrexate and previously prednisone for treatment of his rheumatoid  arthritis.  Most recently I have been actually packing this with silver  gel, and a plain packing.  We have continued to wrap this area.  He has  tolerated this reasonably well.  In the past, he has been through  courses of an Apligraf, Regranex, debridements et Karie Soda.   IMPRESSION:  Refractory venous stasis ulcer.  We have continue with  Silver Shield gel, a plain packing, and an Unna.  The wound actually may  be contracting somewhat although any changes have been very slow.           ______________________________  Maxwell Caul, M.D.     MGR/MEDQ  D:  07/14/2007  T:  07/15/2007  Job:  [id number]

## 2010-10-06 NOTE — Assessment & Plan Note (Signed)
[facility]   NAME:  [name] [name], [name] [name] NO.:  [id number]   MEDICAL RECORD NO.:  [id number]      DATE OF BIRTH:  26-Nov-1920   PHYSICIAN:  Maxwell Caul, M.D. VISIT DATE:  05/05/2007                                   OFFICE VISIT   Mr. Woolsey has been followed for refractory venous stasis on the medial  aspect of his left leg.  At the time of presentation, he had a similar  area on the right leg which has long since healed.  He has had a trial  of the wound vac, Apligraf none of it has really helped.  He continues  on methotrexate for his rheumatoid arthritis.  However, his  corticosteroids have been stopped.   PHYSICAL EXAMINATION:  VITAL SIGNS:  Temperature is 93, pulse 93,  respirations 18, blood pressure is 109/60.   I did remove the large amount of necrotic material with a Q-tip from the  bed of this wound.  It is usual underneath, it is fairly well  granulated, and indeed, there may be efforts at epithelialization from  above caving in from the surface of this wound.   IMPRESSION:  Refractory venous stasis ulcer.  I have continued with a  prism and hydrogel and his Unna wrap.  We will see him again in 10 days.           ______________________________  Maxwell Caul, M.D.     MGR/MEDQ  D:  05/05/2007  T:  05/07/2007  Job:  [id number]

## 2010-10-06 NOTE — Assessment & Plan Note (Signed)
[facility]   NAME:  [name], [name]                ACCOUNT NO.:  [id number]   MEDICAL RECORD NO.:  [id number]      DATE OF BIRTH:  11/06/1920   PHYSICIAN:  Theresia Majors. Tanda Rockers, M.D. VISIT DATE:  11/22/2006                                   OFFICE VISIT   SUBJECTIVE:  [name] returns for follow-up of the stasis ulcer on  the medial aspect of his left ankle.  In the interim, he has been  started on an Oasis protocol, with continuation of compression utilizing  an Radio broadcast assistant.  There has been no excessive drainage, malodor, pain, or  fever.   OBJECTIVE:  VITAL SIGNS:  Blood pressure is 115/66, respirations 18,  pulse rate 81, temperature 98.1.  SKIN:  Inspection of the ulcer shows that there is inspissated Oasis  which has jailed.  This was dabbed and removed with a Q-tip, disclosing  healthy-appearing granulation.  We applied a second layer of Oasis per  protocol and reapplied the Unna wrap.  There is no evidence of a  ascending infection.  There is no evidence of vascular compromise.  Clinical improvement noted was marginal.   PLAN:  We will continue the Oasis protocol with Unna bot wrap.  We will  reevaluate the patient in 11 days due to our holiday schedule.  The  patient has been counseled if there develops any fever, drainage, or  malodor he will be seen in the emergency room or he will call for an  interim appointment.      Harold A. Tanda Rockers, M.D.  Electronically Signed     HAN/MEDQ  D:  11/22/2006  T:  11/23/2006  Job:  [id number]

## 2010-10-06 NOTE — Assessment & Plan Note (Signed)
[facility]   NAME:  [name] [name], [name] [name] NO.:  [id number]   MEDICAL RECORD NO.:  [id number]      DATE OF BIRTH:  [birth year]   PHYSICIAN:  Maxwell Caul, M.D.      VISIT DATE:                                   OFFICE VISIT   [name] [name] is seen today one week following his Apligraf placement.  We  have done this for a refractory venous stasis ulcer.  The Apligraf was  applied on 08/22.   He returns today in follow-up having been wrapped in an [employer].  He  reports no excessive pain, malodor or fever.  Temperature is 98.1.  The  wound itself was not disturbed; however, it was unwrapped down to  Mepitel.  We did not note any particular concerns.  He was rewrapped and  we will review the wound again next week.           ______________________________  Maxwell Caul, M.D.     MGR/MEDQ  D:  01/20/2007  T:  01/21/2007  Job:  [id number]

## 2010-10-06 NOTE — Assessment & Plan Note (Signed)
[facility]   NAME:  [name] [name], [name] [name] NO.:  [id number]   MEDICAL RECORD NO.:  [id number]      DATE OF BIRTH:  [birth year]   PHYSICIAN:  Maxwell Caul, M.D. VISIT DATE:  08/11/2007                                   OFFICE VISIT   REASON FOR VISIT:  Mr. Delaughter returns today for a very refractory  venous stasis ulceration on his medial aspect of his left leg.  He also  has ongoing treatment for rheumatoid arthritis.  Most recently I have  been packing this with silver gel and a plain packing.  We have  continued to wrap this to control periwound edema.  He has been  tolerating reasonably well and we have been following him at the week  intervals.   PHYSICAL EXAMINATION:  VITAL SIGNS: On examination his temperature is  97.8, blood pressure 121/69.  EXTREMITIES: The area in question measures 1 x 0.9 x 0.2.  There are  attempts at epithelialization going down the sides of this wound.  I had  wondered whether we might get full coverage of this wound, albeit with a  continued indentation.  At this point I am uncertain whether this is  going to be the case.   IMPRESSION:  Venous stasis ulceration refractory.  I have continued the  silver gel and plain packing with an Unna wrap.  We should know in the  next 2 weeks or so whether this is going to fully epithelialize or not.  The area has been refractory to multiple other attempts at closure.           ______________________________  Maxwell Caul, M.D.     MGR/MEDQ  D:  08/11/2007  T:  08/11/2007  Job:  [id number]

## 2010-10-06 NOTE — Assessment & Plan Note (Signed)
[facility]   NAME:  DANIAL, HLAVAC NO.:  [id number]   MEDICAL RECORD NO.:  [id number]      DATE OF BIRTH:  Jun 24, 1920   PHYSICIAN:  Maxwell Caul, M.D. VISIT DATE:  02/24/2007                                   OFFICE VISIT   PURPOSE OF TODAY'S VISIT:  Mr. Mottola is an 77 year old man we have  been following with a very refractory venous stasis ulcer on the medial  aspect of his left ankle.  He has actually been out 2 weeks with a  double Prisma and an Unna wrap.  He has reported no excessive pain,  drainage, malodor or fever.  In fact he has tolerated 2-week interval  surprisingly well.   EXAMINATION:  Temperature is 97.8, pulse 82, respirations 16, blood  pressure 122/74.  The ulcer on the left medial ankle measures 1.2 x 0.3  x 0.3.  This has a reasonably clean base, in fact it may look somewhat  better than I am used to seeing this.  There is no evidence of  epithelialization, there is still depth of the wound that would need to  be improved before it would close over.   IMPRESSION:  Refractory venous stasis ulcer.  There is no evidence of  coexistent infection here and the edema seems well controlled.  We will  continue with double Prisma and the Unna wrap and see him back in 2  weeks' time.           ______________________________  Maxwell Caul, M.D.     MGR/MEDQ  D:  02/24/2007  T:  02/24/2007  Job:  [id number]

## 2010-10-06 NOTE — Assessment & Plan Note (Signed)
[facility]   NAME:  [name] [name], [name] [name]                ACCOUNT NO.:  [id number]   MEDICAL RECORD NO.:  [id number]      DATE OF BIRTH:  Apr 15, 1921   PHYSICIAN:  Theresia Majors. Tanda Rockers, M.D. VISIT DATE:  12/09/2006                                   OFFICE VISIT   SUBJECTIVE:  [name] [name] is an 77 year old man whom we are following for  a ulceration of the left medial ankle.  He was last seen a week ago and  was started on an Oasis protocol.  In the interim he has recently been  tapered and discontinued his prednisone.  He continues on methotrexate  once a week.  There has been no interim fever, malodor or drainage.   OBJECTIVE:  Blood pressure is 115/65, respirations 18, pulse rate 75,  temperature is 97.7.  Inspection of the wound shows that there has been decrease in volume.  The old Oasis has been removed.  This discloses healthy-appearing  granulation.  There is no evidence of a ascending infection or abscess  formation.  The capillary refill is brisk.   ASSESSMENT:  Clinical improvement.   PLAN:  We have reapplied the Oasis and the [employer].  We will  reevaluate the patient in 1 week.      Harold A. Tanda Rockers, M.D.  Electronically Signed     HAN/MEDQ  D:  12/09/2006  T:  12/09/2006  Job:  [id number]

## 2010-10-06 NOTE — Assessment & Plan Note (Signed)
[facility]   NAME:  [name] [name], [name] [name] NO.:  [id number]   MEDICAL RECORD NO.:  [id number]      DATE OF BIRTH:  March 20, 1921   PHYSICIAN:  Maxwell Caul, M.D. VISIT DATE:  12/23/2006                                   OFFICE VISIT   PURPOSE TODAY'S VISIT:  Follow-up of a refractory area on the left  medial ankle.  I have most recently been treating this with Oasis  protocol for the last 8 weeks.  I have seen minimal to no improvement in  this area.  I had previously reviewed his chart on October 28, 2006, at  which time I noted prolonged treatment with matrix dressings including  prism and hydrogel and then a prolonged course of Regranex.  All of  these have been under compression wrapped with good edema control.   PHYSICAL EXAMINATION:  On examination once again, this gentleman appears  to be systemically well.  He is afebrile with a temperature of 98.  He  is not a diabetic.  His peripheral pulses are adequate.  Edema control  was good.  The base of the wound has a tan colored eschar which is  easily debrided with a #15 blade.  No anesthesia or hemostasis was  necessary.  The base of this appears to be healthy granulation.  All of  this is as per usual for him.   IMPRESSION AND PLAN:  Refractory largely venous stasis ulcer.  The  dimensions and appearance of this wound have not changed in many, many  weeks to months.  Oasis has not been effective and I have discontinued  this today.  I have gone ahead and applied Aquacel AG to this wound and  placed him in an Unna wrap.  I have asked for prior approval for an  Apligraf.  I do not believe this has been tried in him.  We will review  him again in a week.           ______________________________  Maxwell Caul, M.D.     MGR/MEDQ  D:  12/23/2006  T:  12/23/2006  Job:  [id number]

## 2010-10-06 NOTE — Assessment & Plan Note (Signed)
[facility]   NAME:  [name] [name], [name] [name]                ACCOUNT NO.:  [id number]   MEDICAL RECORD NO.:  [id number]      DATE OF BIRTH:  Sep 25, 1920   PHYSICIAN:  Theresia Majors. Tanda Rockers, M.D. VISIT DATE:  04/19/2007                                   OFFICE VISIT   SUBJECTIVE:  [name] [name] is an 77 year old man who we are using a wound  VAC to manage a recalcitrant stasis ulcer involving his left medial  malleolus.  He has maintained a wound VAC for 6 days.  There has been  drainage.  There has been no fever.  The seal has been maintained by  external wrapping with a Ace bandage.  The patient continues to be  ambulatory.   OBJECTIVE:  Blood pressure is 107/73, respirations 16, pulse rate 78,  temperature 98.1.  Inspection of the wound shows that the ulcer is  essentially unchanged.  There is no drainage and the edema is well-  controlled.  There is no evidence of ascending infection or  inflammation.   ASSESSMENT:  Persistent stasis ulcer.   PLAN:  We will continue external compression as well as the wound VAC.  We will reevaluate the patient per protocol.      Harold A. Tanda Rockers, M.D.  Electronically Signed     HAN/MEDQ  D:  04/19/2007  T:  04/20/2007  Job:  [id number]

## 2010-10-06 NOTE — Assessment & Plan Note (Signed)
[facility]   NAME:  [name] [name], [name] [name] NO.:  [id number]   MEDICAL RECORD NO.:  [id number]      DATE OF BIRTH:  1920-11-04   PHYSICIAN:  Maxwell Caul, M.D. VISIT DATE:  06/30/2007                                   OFFICE VISIT   [name] returns today in follow-up for area of refractory venostasis  ulceration on the medial aspect of his left leg.  This has been  complicated by methotrexate and previously prednisone treatment for  rheumatoid arthritis.  We have been through many treatment options with  regards to this wound including Apligraf, Regranex, debridement's with  wound vacs etc. This has not resulted in any significant change.  There  has been no evidence of infection. Most recently I have been using a  silver based wound gel and plain packing and continuing to Unna wrap  this area.   PHYSICAL EXAMINATION:  VITALS:  His temperature is 97.9, pulse 86,  respirations 16, blood pressure 125/69. The area measures 0.9 x 0.4 x  0.4.  This is a small albeit deep wound.  The base of this wound does  not appear to be in need of debridement.  This has been the most  optimistic part of this over the last several months.   IMPRESSION:  Refractory venostasis ulceration.  I have readded silver  shield wound gel and a plain packing. I will see this again in 2 weeks.           ______________________________  Maxwell Caul, M.D.     MGR/MEDQ  D:  06/30/2007  T:  07/01/2007  Job:  [id number]

## 2010-10-06 NOTE — Assessment & Plan Note (Signed)
[facility]   NAME:  LOOMIS, ANACKER NO.:  [id number]   MEDICAL RECORD NO.:  [id number]      DATE OF BIRTH:  December 31, 1920   PHYSICIAN:  Maxwell Caul, M.D. VISIT DATE:  01/27/2007                                   OFFICE VISIT   Mr. Cuccia returns today now 2 weeks post Apligraf placement to his  small wound on the left lower extremity.  Unfortunately, the wound  itself again is covered in the tan-colored eschar that he so frequently  has.  I did not debride this today.  We will see again next week.  We  did apply a nonadherent dressing and reapplied his Unna wrap.  We will  see this again next week.           ______________________________  Maxwell Caul, M.D.     MGR/MEDQ  D:  01/27/2007  T:  01/28/2007  Job:  [id number]

## 2010-10-06 NOTE — Assessment & Plan Note (Signed)
[facility]   NAME:  [name] [name], [name] [name]                ACCOUNT NO.:  [id number]   MEDICAL RECORD NO.:  [id number]      DATE OF BIRTH:  [birth year]   PHYSICIAN:  Theresia Majors. Tanda Rockers, M.D.      VISIT DATE:                                   OFFICE VISIT   SUBJECTIVE:  [name] [name] is an 77 year old man who we followed for  recalcitrant stasis ulcer for over a year.  We have treated him with  compression therapy.  He returns for follow up.  There has been no  interim drainage or pain.  He continues to be ambulatory.  There is no  excessive swelling.   OBJECTIVE:  Blood pressure is 112/51, respirations 16, pulse rate 76,  and temperature 98.2.  Inspection of the left medial ankle shows that  there is well-controlled edema judged at 1+.  The wound itself has  decreased significantly.  There is no drainage.  There is no malodor.  There is no evidence of infection.  The pedal pulse remains slightly  palpable.   ASSESSMENT/PLAN:  Marginal improvement in stasis ulcer.   PLAN:  We will return the patient to compression wrap therapy.  We will  re-evaluate him weekly by the nurse and in 2 weeks by the physician.      Harold A. Tanda Rockers, M.D.  Electronically Signed     HAN/MEDQ  D:  10/13/2007  T:  10/14/2007  Job:  [id number]

## 2010-10-06 NOTE — Assessment & Plan Note (Signed)
[facility]   NAME:  [name], [name] NO.:  [id number]   MEDICAL RECORD NO.:  [id number]      DATE OF BIRTH:  08-15-1920   PHYSICIAN:  Maxwell Caul, M.D. VISIT DATE:  04/28/2007                                   OFFICE VISIT   HISTORY:  [name] [name] is here for followup of his refractory area of  venous stasis ulceration on the medial aspect of his left leg.  In the  interim, he has had a wound V.A.C. trial; however, this did not result  in any improvements.  He continues with methotrexate and corticosteroid  for severe rheumatoid arthritis.  Also, apparently he fell since last  time he was here and fractured his arm.   EXAMINATION:  The wound is much as I am used to seeing it.  I did  debride the bases of this and some callus material around it.  There is  evidence of granulation, but this has not filled in any meaningful way  in the many months I have been following this.  There is certainly no  evidence of infection.   IMPRESSION:  Persistent and refractory venous stasis ulcer.  I have gone  back to a prism and hydrogel-based dressing and continuing his Unna  wrap.  We will see him again in a week.           ______________________________  Maxwell Caul, M.D.     MGR/MEDQ  D:  04/28/2007  T:  04/29/2007  Job:  [id number]

## 2010-10-07 LAB — DIFFERENTIAL
Basophils Absolute: 0.1 10*3/uL (ref 0.0–0.1)
Basophils Relative: 1 % (ref 0–1)
Eosinophils Absolute: 0.1 10*3/uL (ref 0.0–0.7)
Eosinophils Relative: 1 % (ref 0–5)
Lymphocytes Relative: 17 % (ref 12–46)
Lymphs Abs: 0.9 10*3/uL (ref 0.7–4.0)
Monocytes Absolute: 0.4 10*3/uL (ref 0.1–1.0)
Monocytes Relative: 7 % (ref 3–12)
Neutro Abs: 3.9 10*3/uL (ref 1.7–7.7)
Neutrophils Relative %: 74 % (ref 43–77)

## 2010-10-07 LAB — PROTIME-INR
INR: 2.11 — ABNORMAL HIGH (ref 0.00–1.49)
Prothrombin Time: 23.8 s — ABNORMAL HIGH (ref 11.6–15.2)

## 2010-10-07 LAB — CBC
Hemoglobin: 10.5 g/dL — ABNORMAL LOW (ref 13.0–17.0)
MCH: 28 pg (ref 26.0–34.0)
Platelets: DECREASED 10*3/uL (ref 150–400)
RBC: 3.75 MIL/uL — ABNORMAL LOW (ref 4.22–5.81)
WBC: 5.4 10*3/uL (ref 4.0–10.5)

## 2010-10-07 LAB — BASIC METABOLIC PANEL WITH GFR
BUN: 24 mg/dL — ABNORMAL HIGH (ref 6–23)
CO2: 24 meq/L (ref 19–32)
Calcium: 7.8 mg/dL — ABNORMAL LOW (ref 8.4–10.5)
Chloride: 102 meq/L (ref 96–112)
Creatinine, Ser: 1.96 mg/dL — ABNORMAL HIGH (ref 0.4–1.5)
GFR calc non Af Amer: 33 mL/min — ABNORMAL LOW
Glucose, Bld: 89 mg/dL (ref 70–99)
Potassium: 3.5 meq/L (ref 3.5–5.1)
Sodium: 134 meq/L — ABNORMAL LOW (ref 135–145)

## 2010-10-07 LAB — CLOSTRIDIUM DIFFICILE BY PCR: Toxigenic C. Difficile by PCR: NEGATIVE

## 2010-10-08 LAB — PROTIME-INR
INR: 2.02 — ABNORMAL HIGH (ref 0.00–1.49)
Prothrombin Time: 23 seconds — ABNORMAL HIGH (ref 11.6–15.2)

## 2010-10-08 NOTE — H&P (Signed)
NAME:  [name] [name], [name] [name] NO.:  [id number]  MEDICAL RECORD NO.:  [id number]           PATIENT TYPE:  LOCATION:                                 FACILITY:  PHYSICIAN:  Eduard Clos, MDDATE OF BIRTH:  04/15/1921  DATE OF ADMISSION: DATE OF DISCHARGE:                             HISTORY & PHYSICAL   PRIMARY CARE PHYSICIAN:  Marjory Lies, M.D.  CHIEF COMPLAINT:  Weakness.  HISTORY OF PRESENT ILLNESS:  An 77 year old male with history of PE, rheumatoid arthritis, hypertension, chronic kidney disease, had gone to dinner with his family after which he returned home and was planning to change his cloths when he suddenly slumped and fell and family felt he had garbled speech and was very weak.  EMS was called immediately and he was brought to ER.  He did have some weakness on left side, which is chronic from his left shoulder wound and chronic left lower extremity weakness.  Neurology was called on code stroke and code stroke was cancelled after neurology felt there was no new focal deficit and CT head was negative.  The patient was found to be febrile with temperatures around 103.  At this time, chest x-ray is negative.  UA is not showing any definite source for infection and he does have a left lower extremity wound on the left ankle lateral aspect, which is chronic and at this time does have mild drainage which the patient's family states is chronic.  The patient will be admitted for SIRS, source of which is not really known.  The patient denies any chest pain.  Denies any cough or phlegm.  Denies shortness of breath.  Denies any nausea, vomiting, abdominal pain, dysuria, discharge, or diarrhea.  Denies any headache or visual symptoms.  PAST MEDICAL HISTORY:  Rheumatoid arthritis, pulmonary embolism, hypertension, chronic kidney disease, BPH.  PAST SURGICAL HISTORY:  Hernia repair.  MEDICATIONS PRIOR TO ADMISSION:  Coumadin, tramadol 50 mg twice daily  as needed p.r.n. for pain, tamsulosin 0.4 mg daily, prednisone 10 mg 2 tablets daily, omeprazole 40 daily, Osteo Bi-Flex, Mucinex, methotrexate 2.5 mg 4 tablets, lisinopril 20 mg half a tablet daily, furosemide 20 mg daily, Combivent.  ALLERGIES:  CONTRAST MEDIA.  SOCIAL HISTORY:  The patient quit smoking and drinking 40 years ago. Denies any drug abuse.  As per the family, he is a full code.  REVIEW OF SYSTEMS:  As per history of present illness, nothing else significant.  The patient was having increasing frequency of urine.  PHYSICAL EXAMINATION:  GENERAL:  The patient examined at bedside, appears weak and tired but is alert and awake. VITAL SIGNS:  Blood pressure is 108/64, pulse is 110 per minute, temperature 103, respirations 24 per minute, O2 sat is 96%. HEENT:  Anicteric.  No pallor.  No facial asymmetry.  Tongue is midline and dry.  No neck rigidity.  No discharge from ears, eyes, nose, or mouth. CHEST:  Bilateral air entry present.  No rhonchi, no crepitation. HEART:  S1 and S2 are heard. ABDOMEN:  Soft, nontender.  Bowel sounds heard. CNS:  Alert, awake, and oriented to time,  place, and person.  He is able to move upper and lower extremity.  His strength 5/5 on both right upper and lower extremities.  The strength of the left side is decreased because the patient has chronic shoulder injury.  He can only do around 30% abduction and he has decreased strength.  He also has a mild decreased strength in the left lower which is chronic. EXTREMITIES:  There is mild edema in both lower extremities.  There is a wound on the left lower leg and the foot lateral aspect, mild drainage and a small opening.  There is also in the left foot great toe is showing skin excoriations which family states has been draining after the skin peeling out 6 weeks ago.  Right side pulse is bounding and the left one is poor.  LABORATORY DATA:  Chest x-ray shows no active cardiopulmonary disease. CT  of the head without contrast shows mild chronic microvascular ischemia.  No acute intracranial abnormality, chronic sinusitis.  CBC, WBC 8.8, hemoglobin is 11.6, hematocrit is 34.3, platelets 180.  PT/INR is 25.8 and 2.3.  Complete metabolic panel, sodium 132, potassium 4.9, chloride 95, carbon dioxide 25, anion gap is 12, glucose 111, BUN 41, creatinine 1.7, total bilirubin is 0.3 and direct is 0.1, alka phos 53, AST 32, ALT 24, total protein 6.8, albumin 3, calcium 9.5, lactic acid is 2.8, lipase is 59, CK is 165, troponin less than 0.3, troponin less than 0.05, myoglobin is more than 500.  UA shows moderate blood, nitrites and leukocytes are negative.  ASSESSMENT: 1. Systemic inflammatory response syndrome, source not clear. 2. Chronic left leg wound. 3. History of rheumatoid arthritis on methotrexate and prednisone. 4. History of pulmonary embolism on Coumadin. 5. History of hypertension, presently hypotensive. 6. Chronic kidney disease. 7. Chronic anemia.  PLAN: 1. At this time, we will admit the patient to step-down unit. 2. For his SIRS, the patient is presently responding to fluids.  We     will continue IV hydration.  We will get blood cultures, urine     cultures, and x-ray of the left foot and ankle.  At this time, we     do not know the exact source for his infection.  We are going to     treat empirically with vanc and Zosyn.  We will follow up cultures     and particularly look for the x-ray of the left foot and ankle to     make sure there is no osteomyelitis or any deep soft-tissue     swelling suggesting cellulitis. 3. History of rheumatoid arthritis on prednisone.  At this time, we     will give stress dose hydrocortisone.  We will continue prednisone.     We will hold the methotrexate until blood cultures are negative and     urine cultures show no growth. 4. History of hypertension, presently hypotensive.  We will hold his     antihypertensives including  lisinopril and     Lasix. 5. History of PE on Coumadin, which will be managed by pharmacy. 6. Further recommendations as condition evolves.  Currently cultures     are pending.  This patient is a full code.     Eduard Clos, MD     ANK/MEDQ  D:  10/05/2010  T:  10/05/2010  Job:  [id number]  cc:   Marjory Lies, M.D.  Electronically Signed by Midge Minium MD on 10/08/2010 07:19:24 AM

## 2010-10-09 NOTE — Assessment & Plan Note (Signed)
[facility]   NAME:  [name], [name]                ACCOUNT NO.:  [id number]   MEDICAL RECORD NO.:  [id number]      DATE OF BIRTH:  29-Mar-1921   PHYSICIAN:  Theresia Majors. Tanda Rockers, M.D.      VISIT DATE:                                     OFFICE VISIT   SUBJECTIVE:  [name] is an 77 year old man with a nonhealing ulceration  on the medial aspect of the left ankle.  The patient has been treated with  compression wrap and, in the interim, Regranex.  The compression wrap was  substituted for compression hose so he would be able to use the Regranex  b.i.d.  In the interim, he has experienced increased edema and feels that  the wound has retrogressed.   OBJECTIVE:  Blood pressure is 115/64, respirations 18, pulse rate 72, and he  is afebrile.  Inspection of the wound shows that there is low arterial  volume, but this is associated with +2 edema in the ankle area.  There is  some mild tenderness to palpation.  There is no local warmth.  His initial  exam disclosed palpable pulses.  An ABI performed today is 1.13.  Under EMLA  anesthetic cream, a full-thickness debridement was performed without  difficulty with hemorrhage control with direct pressure.   ASSESSMENT:  Retrogressed stasis ulcer, most likely related to the loss of  effective compression.   PLAN:  We will hold his Regranex and resume treatment with external  compression.  We will apply an Unna boot today and reevaluated the patient  in 1 week.           ______________________________  Theresia Majors. Tanda Rockers, M.D.     [name] [name]  D:  03/30/2006  T:  03/30/2006  Job:  [id number]

## 2010-10-09 NOTE — Assessment & Plan Note (Signed)
[facility]   NAME:  [name] [name], [name] [name]                ACCOUNT NO.:  [id number]   MEDICAL RECORD NO.:  [id number]          DATE OF BIRTH:   PHYSICIAN:  Theresia Majors. Tanda Rockers, M.D. VISIT DATE:  08/02/2006                                   OFFICE VISIT   VITAL SIGNS:  Blood pressure is 101/69, respirations are 20, pulse rate  100, temperature is 98.   PURPOSE OF TODAY'S VISIT:  [name] is an 77 year old man who we are  treating for a stasis ulceration over the medial aspect of the left  lower extremity.   WOUND EXAM:  Inspection of the lower extremity shows that the ulcer is  marginally contracted.  There is no excessive drainage.  There is no  malodor.  The matrix dressing remains adherent.  We were able to loosen  the edges and reapply fresh wafer of the prisma.  The pedal pulse  remains palpable.  There is no evidence of ischemia.   WOUND SINCE LAST VISIT:  In the interim, he has been treated with a  Silver matrix dressing and a compression Unna wrap.  He denies excessive  drainage, malodor, pain, or fever.   DIAGNOSIS:  Slowly healing wound.   MANAGEMENT PLAN & GOAL:  We will continue compression and the Silver  matrix dressing.  We will reevaluate the patient in one week.      Harold A. Tanda Rockers, M.D.  Electronically Signed     HAN/MEDQ  D:  08/02/2006  T:  08/02/2006  Job:  [id number]

## 2010-10-09 NOTE — Assessment & Plan Note (Signed)
[facility]   NAME:  [name] [name], [name] [name] NO.:  [id number]   MEDICAL RECORD NO.:  [id number]      DATE OF BIRTH:  07-25-1920   PHYSICIAN:  Maxwell Caul, M.D.      VISIT DATE:                                   OFFICE VISIT   VITAL SIGNS:  Temperature is 97.8, pulse 82, respirations 18, blood  pressure 120/82.   PURPOSE OF TODAY'S VISIT:  Followup of an ulceration on the medial  aspect of his left ankle, which is venous stasis in etiology.   [name] returns today in followup for the aforementioned wound on  the medial aspect of the left ankle.  He has gone through various  treatment modalities, including Matrix dressings and Regranex.  This has  been a granulating, but extremely slow to heal wound.  In the interim,  he has been started on methotrexate due to the severity of his  arthritis.  He has not experienced any new fever, drainage or pain.   WOUND EXAM:  The wound itself measures 1.6 x 0.5 x 0.4, this was covered  with a tan colored eschar, which underwent a full thickness debridement.  No anesthesia was required.  Hemostasis was a direct pressure after the  debridement.  There was a clean granulating base, which actually looked  quite healthy.  There is no evidence of surrounding infection or  cellulitis.   DIAGNOSES:  1. Venous stasis wound.  We are going to apply double Prisma and      continue him with an Unna wrap.  2. Dyspnea on exertion.  I received a call from Dr. Fannie Knee today      who has been evaluating the patient for dyspnea.  Briefly, he was      somewhat concerned about the possibility of a pulmonary embolism      based on a D-dimer over 4.  He has had a CT scan of his chest that      was negative for PE.  Dr. Maple Hudson wished me to go ahead and arrange a      duplex ultrasound before we rewrapped him today.  We have arranged      this at [facility].  He will come back here to be      wrapped if this  is a negative study.           ______________________________  Maxwell Caul, M.D.     MGR/MEDQ  D:  07/01/2006  T:  07/02/2006  Job:  [id number]   cc:   Joni Fears D. Maple Hudson, MD, FCCP, FACP  Marjory Lies, M.D.

## 2010-10-09 NOTE — Assessment & Plan Note (Signed)
[facility]   NAME:  [name] [name], [name] [name]                ACCOUNT NO.:  [id number]   MEDICAL RECORD NO.:  [id number]      DATE OF BIRTH:  [birth year]   PHYSICIAN:  Theresia Majors. Tanda Rockers, M.D. VISIT DATE:  10/07/2006                                   OFFICE VISIT   SUBJECTIVE:  [name] [name] is an 77 year old man who we are seeing for a  recalcitrant stasis ulcer involving his left medial ankle.  Over the  past week, he has been treated with Steri-Strip approximation, felt pad  and an ace wrap.  There has been no excessive drainage, malodor, pain or  fever.   OBJECTIVE:  His blood pressure is 129/64, respirations 18, pulse rate  74, temperature 98.1.  Inspection of the ankle shows severe local edema  at the level of the wound with moderate desquamation.  There is no  malodor and no exudate.  There is no evidence of ischemia or ascending  infection.   ASSESSMENT:  Slowly healing wound.   PLAN:  We returned the patient to a local ace wrap for compression with  reapproximation of the wound with benzoin and Steri-Strips.  We will  instruct the patient to resume local wound care, including showers on a  daily basis with a reapplication as has been demonstrated in the clinic.  If he develops fever, excessive swelling, pain, or is concerned in any  way, he will call us for an interim evaluation.  Otherwise, we will  reevaluate him in 2 weeks.      Harold A. Tanda Rockers, M.D.  Electronically Signed     HAN/MEDQ  D:  10/07/2006  T:  10/07/2006  Job:  [id number]

## 2010-10-09 NOTE — Assessment & Plan Note (Signed)
[facility]   NAME:  [name], [name]                ACCOUNT NO.:  [id number]   MEDICAL RECORD NO.:  [id number]      DATE OF BIRTH:  September 05, 1920   PHYSICIAN:  Theresia Majors. Tanda Rockers, M.D.      VISIT DATE:                                     OFFICE VISIT   VITAL SIGNS:  Blood pressure is 130/80, respirations 18, pulse rate 76 and  he is afebrile.   PURPOSE OF TODAY'S VISIT:  [name] is an 77 year old man with an  ulceration on the medial aspect of his left ankle.  He has had a biopsy  performed.  He returns for followup in the interim.  He denies excessive  drainage, pain or malodor.   WOUND EXAM:  Inspection of the ulcer and the biopsy site shows that there is  a mature-appearing S eschar with no drainage and no malodor.  Review of the  biopsy final diagnosis discloses an angiodermatitis.   DIAGNOSIS:  Stasis ulceration, no evidence of malignancy.   MANAGEMENT PLAN & GOAL:  We will continue external compression and local  care.  We will reevaluate the patient in one week.           ______________________________  Theresia Majors. Tanda Rockers, M.D.     [name] [name]  D:  04/13/2006  T:  04/13/2006  Job:  [id number]

## 2010-10-09 NOTE — Assessment & Plan Note (Signed)
[facility]   NAME:  [name] [name], [name] [name]                ACCOUNT NO.:  [id number]   MEDICAL RECORD NO.:  [id number]      DATE OF BIRTH:  1920-05-25   PHYSICIAN:  Theresia Majors. Tanda Rockers, M.D.      VISIT DATE:                                   OFFICE VISIT   VITAL SIGNS:  For the vital signs, see the nurses notes.   PURPOSE OF TODAY'S VISIT:  [name] is a 77 year old man who we have  followed for a stasis ulcer.  Most recently, we discontinued the  concurrent administration of methotrexate with the expectation that his  wound would begin to show improvement.  Since his last visit, he has  denied fever, increase in pain or malodor.   WOUND EXAM:  On inspection of the wound, the ulcer itself has areas of  persistent necrosis at the periphery.  This was full thickness.  Debrided without incident.  Hemorrhage was controlled with direct  pressure.  There has been some contraction of the wound.   WOUND SINCE LAST VISIT:   CHANGE IN INTERVAL MEDICAL HISTORY:   DIAGNOSIS:  Static wound.   TREATMENT:   ANESTHETIC USED:   TISSUE DEBRIDED:   LEVEL:   CHANGE IN MEDS:   COMPRESSION BANDAGE:   OTHER:   MANAGEMENT PLAN & GOAL:  We will continue to hold the methotrexate.  We  will hold the Regranex at this point.  We will place a double prisma  layer and hydrogel with an Una-wrap and reevaluate the patient in 1  week.           ______________________________  Theresia Majors. Tanda Rockers, M.D.    [name]  D:  04/27/2006  T:  04/27/2006  Job:  [id number]

## 2010-10-09 NOTE — Assessment & Plan Note (Signed)
[facility]   NAME:  [name] [name], [name] [name]                ACCOUNT NO.:  [id number]   MEDICAL RECORD NO.:  [id number]      DATE OF BIRTH:  Oct 22, 1920   PHYSICIAN:  Theresia Majors. Tanda Rockers, M.D. VISIT DATE:  02/09/2006                                     OFFICE VISIT   VITAL SIGNS:  Blood pressure 130/76, respirations 20, pulse rate 80 and he  is afebrile.   PURPOSE OF TODAY'S VISIT:  Mr. Platner returns for follow-up of a stasis  ulcer on the medial aspect of his left lower extremity.  During the interim,  he has been started on Regranex protocol.  He reports that he has  had  persistent and continuous swelling in the lower extremity but there has been  less pain at the area of the ulcer.   WOUND EXAM:  Inspection of the wound shows that there has been definite  improvement with advancement of epithelium.  The leg itself has 3+ edema.   WOUND SINCE LAST VISIT:   CHANGE IN INTERVAL MEDICAL HISTORY:   DIAGNOSIS:  Improved wound, inadequate compression.   TREATMENT:   ANESTHETIC USED:   TISSUE DEBRIDED:   LEVEL:   CHANGE IN MEDS:   COMPRESSION BANDAGE:   OTHER:   MANAGEMENT PLAN & GOAL:  We have instructed the patient to stay off of his  leg today, to continue his Regranex protocol and to resume wearing his  stocking first thing in the morning and to wear it throughout the day and  take it off again at night.  He is not to sleep in the stocking but he is to  definitely continue compression therapy.  We will see him back in two weeks.          ______________________________  Theresia Majors Tanda Rockers, M.D.    [name]  D:  02/09/2006  T:  02/10/2006  Job:  [id number]

## 2010-10-09 NOTE — Op Note (Signed)
NAME:  [name] [name], [name] [name] NO.:  [id number]   MEDICAL RECORD NO.:  [id number]                   PATIENT TYPE:  AMB   LOCATION:  ENDO                                 FACILITY:  [facility]   PHYSICIAN:  Petra Kuba, M.D.                 DATE OF BIRTH:  [birth year]   DATE OF PROCEDURE:  08/20/2003  DATE OF DISCHARGE:                                 OPERATIVE REPORT   PROCEDURE:  Colonoscopy and polypectomy.   INDICATIONS:  Screening.  Consent was signed after risks, benefits, methods,  and options were thoroughly discussed in the office.   PREMEDICATIONS:  Demerol 60 mg, Versed 6 mg.   DESCRIPTION OF PROCEDURE:  Rectal inspection pertinent for external  hemorrhoids, small.  Digital exam was negative.  Video pediatric adjustable  colonoscope was inserted and with abdominal pressure easily advanced around  the colon to the cecum.  No obvious abnormality was seen on insertion.  The  cecum was identified by the appendiceal orifice and the ileocecal valve.  Prep was adequate.  There was some liquid stool that required washing and  suctioning.  Not far from the appendiceal orifice and the cecum was a 1-2 cm  flat, villous, and edematous-looking polyp which was carefully hot biopsied  x4 on the setting of 150/15 using the ERBE machine.  I doubt all the polyp  was removed but that was __________we wanted to be on the first attempt.  The scope was then slowly withdrawn.  The ascending and transverse was  normal.  There was a rare left-sided diverticula seen.  In the mid sigmoid  was a small polyp which was snare electrocautery applied and polyp suctioned  through the scope and collected in the trap.  The scope was further  withdrawn.  No additional findings were seen as we slowly withdrew back to  the rectum.  Anorectal pull-through and retroflexion confirmed some small  hemorrhoids.  The scope was reinserted a short ways up the left side of the  colon and air was  suctioned.  The scope removed.  The patient tolerated the  procedure well.  There was no obvious immediate complications.   ENDOSCOPIC DIAGNOSES:  1. Internal and external small  hemorrhoids.  2. Rare left-sided diverticula.  3. Mid sigmoid small polyps snared.  4. Cecal __________ carefully hot biopsied on the lower setting, probably     not completely removed.  5. Otherwise within normal limits to cecum.   PLAN:  Await pathology to determine when to repeat colonoscopy and will need  to discuss more aggressive polypectomy versus just following this based on  his advanced age.  Happy to see back p.r.n. Otherwise return care to Dr.  Doristine Counter for the customary health care maintenance.  Petra Kuba, M.D.    MEM/MEDQ  D:  08/20/2003  T:  08/20/2003  Job:  [id number]   cc:   Marjory Lies, M.D.  [address]  [place]  [place] [address]  Fax: [phone]

## 2010-10-09 NOTE — Assessment & Plan Note (Signed)
[facility]   NAME:  FORNEY, KLEINPETER NO.:  [id number]   MEDICAL RECORD NO.:  [id number]            DATE OF BIRTH:   PHYSICIAN:  Maxwell Caul, M.D. VISIT DATE:  06/13/2006                                   OFFICE VISIT   PURPOSE OF TODAY'S VISIT:  Followup of an ulceration on the medial  aspect of the left ankle.  This is felt to be venous stasis in  causation.   WOUND EXAM:  The wound on the left medial ankle is now measured 1.5 x  0.6 x 0.5 looking back over the last several months, shows some  contraction in these dimensions.  Once again, there was a whitish eschar  over 50% of the base of this wound.  This underwent a full thickness  debridement.  No anesthesia was required, only direct pressure for  hemostasis.  Once again, the wound base looked clean at the end of the  debridement.   MANAGEMENT PLAN & GOAL:  I have continued the external compression with  an Unna and Promogran in the wound itself.  There is no evidence of  infection.  He has venous stasis around this with continued edema, which  is the reason for the compression.  Once again, there was no evidence of  concurrent infection.  His temperature was 97.8.           ______________________________  Maxwell Caul, M.D.     MGR/MEDQ  D:  06/13/2006  T:  06/13/2006  Job:  [id number]

## 2010-10-09 NOTE — Assessment & Plan Note (Signed)
[facility]   NAME:  [name] [name], [name] [name]                ACCOUNT NO.:  [id number]   MEDICAL RECORD NO.:  [id number]      DATE OF BIRTH:  08-17-1920   PHYSICIAN:  Theresia Majors. Tanda Rockers, M.D. VISIT DATE:  06/21/2006                                   OFFICE VISIT   VITAL SIGNS:  Blood pressure is 122/68.  Respirations 20.  Pulse rate  86.  Temperature is 98.   PURPOSE OF TODAY'S VISIT:  [name] [name] is an 77 year old man with an  ulceration on the medial aspect of the left ankle.  We have treated him  with various modalities including serial debridements, Matrix dressings,  and Regranex.  The patient continues to have a clean, partially  granulated, but extremely slow-healing wound.  In the interim, he has  been restarted on his methotrexate due to the severity of his arthritis.  He denies interim fever, drainage, malodor, or pain.   WOUND EXAM:  Inspection of the lower extremity shows a persistence of  stasis changes.  There is only 1+ edema.  There is no hyperemia, no  cellulitis.   DIAGNOSIS:  Persistent stasis ulcer.   TREATMENT:  We will continue external compression adding a silver Matrix  dressing and reevaluate the patient in 7 to 10 days.  The patient  understands that if there is excessive drainage or malodor, he will call  [name] [name] sooner.           ______________________________  Theresia Majors Tanda Rockers, M.D.     [name]  D:  06/21/2006  T:  06/21/2006  Job:  [id number]

## 2010-10-09 NOTE — Assessment & Plan Note (Signed)
[facility]   NAME:  [name] [name], [name] [name]                ACCOUNT NO.:  [id number]   MEDICAL RECORD NO.:  [id number]      DATE OF BIRTH:  16-Jan-1921   PHYSICIAN:  Theresia Majors. Tanda Rockers, M.D. VISIT DATE:  04/06/2006                                     OFFICE VISIT   VITAL SIGNS:  Blood pressure is 137/60, respirations 16, pulse rate 68 and  he is afebrile.   PURPOSE OF TODAY'S VISIT:  [name] is an 77 year old man who we follow  for a left medial stasis ulcer.  We have treated him with compression wraps  intermittently and most recently we have initiated a Regranex protocol, but  discontinued that because of the intense edema.  He returns today for  followup.  He denies interim pain or malodor or fever.   WOUND EXAM:  Inspection of the left lower extremity shows that there is a  persistence of 2+ edema, both pretibial and at the ankle level.  The ulcer  itself has a moderate amount of soft exudate with some necrosis.  The wound  was anesthetized with EMLA cream injected in a sterile technique with 1%  Xylocaine and a full thickness biopsy was performed.  Hemorrhage was  controlled with direct pressure.   DIAGNOSIS:  Nonhealing stasis ulcer.   MANAGEMENT PLAN & GOAL:  We will adjust our treatment based on today's  biopsy report.   ADDENDUM:  The patient's pedal pulses remain readily palpable and there is  no evidence of concurrent ischemia.           ______________________________  Theresia Majors. Tanda Rockers, M.D.     [name]  D:  04/06/2006  T:  04/06/2006  Job:  [id number]

## 2010-10-09 NOTE — Assessment & Plan Note (Signed)
[facility]   NAME:  [name] [name], [name] [name]                ACCOUNT NO.:  [id number]   MEDICAL RECORD NO.:  [id number]           DATE OF BIRTH:   PHYSICIAN:  Theresia Majors. Tanda Rockers, M.D. VISIT DATE:  12/09/2005                                     OFFICE VISIT   SUBJECTIVE:  [name] [name] returns for follow-up of a stasis ulcer on the left  medial malleolus.  During the interim he has worn a compressive Unna boot.  He has scant drainage and markedly decreased pain.   OBJECTIVE:  VITAL SIGNS:  Blood pressure 125/75, respirations 22, pulse rate  72 and he is afebrile.  EXTREMITIES:  Inspection of the wound shows that the chronic changes of  stasis are persistent.  There is 2+ edema.  The ulcer itself was debrided of  non-viable exudative necrosis.  The underlying granulation tissue bled  freely and was controlled with direct pressure.  An Unna boot was replaced.   ASSESSMENT:  Improved stasis ulcer.   PLAN:  Reevaluate the patient in 10 days p.r.n.           ______________________________  Theresia Majors. Tanda Rockers, M.D.     [name] [name]  D:  12/09/2005  T:  12/09/2005  Job:  [id number]

## 2010-10-09 NOTE — Assessment & Plan Note (Signed)
[facility]   NAME:  [name] [name], [name] [name]                ACCOUNT NO.:  [id number]   MEDICAL RECORD NO.:  [id number]            DATE OF BIRTH:   PHYSICIAN:  Theresia Majors. Tanda Rockers, M.D. VISIT DATE:  07/26/2006                                   OFFICE VISIT   VITAL SIGNS:  His blood pressure is 122/76, respirations 18, pulse rate  88, temperature 97.8.   PURPOSE OF TODAY'S VISIT:  [name] returns for followup of an  ulceration on the left medial ankle with features of stasis complicated  by concurrent administration of Methotrexate.   WOUND EXAM:  Inspection of the wound itself shows that there is trace  edema.  The wound lacks intense inflammation, but there appears to be  healthy-appearing granulation after the Promogran has been evacuated  from the depth of the wound.  There is no excessive drainage.  There is  no malodor.  There is no extensive necrosis.  A debridement is not  needed.  The pedal pulse remains readily palpable with intact sensation.   WOUND SINCE LAST VISIT:  In the interim, he denies drainage, malodor,  pain, or fever.   DIAGNOSIS:  Stasis ulcer complicated by concurrent administration of  antimetabolites.   MANAGEMENT PLAN & GOAL:  We will continue utilization of compression  with a matrix dressing.  We will reevaluate the patient in one week.   We have discussed the slow nature of this healing, and the patient is  aware that I do not recommend additional agents.  Our objective is to  keep the wound clean to avoid infection, excessive drainage, malodor,  pain, or fever.  The patient expresses gratitude for having been seen in  the clinic and indicates satisfactory with the current management.           ______________________________  Theresia Majors. Tanda Rockers, M.D.     [name]  D:  07/26/2006  T:  07/26/2006  Job:  [id number]

## 2010-10-09 NOTE — Op Note (Signed)
NAME:  [name] [name], [name] [name] NO.:  [id number]   MEDICAL RECORD NO.:  [id number]                   PATIENT TYPE:  AMB   LOCATION:  ENDO                                 FACILITY:  [facility]   PHYSICIAN:  Petra Kuba, M.D.                 DATE OF BIRTH:  [birth year]   DATE OF PROCEDURE:  01/14/2003  DATE OF DISCHARGE:                                 OPERATIVE REPORT   PROCEDURE PERFORMED:  EGD with Savary dilatation.   INDICATIONS FOR PROCEDURE:  Recurrent dysphagia.   Consent was signed after risks, benefits, methods and options were  thoroughly discussed in the office on multiple occasions.   MEDICINES USED:  Demerol 50, Versed 5.   DESCRIPTION OF PROCEDURE:  The video endoscope was inserted by direct  vision.  The proximal and mid esophagus was normal.  In the distal esophagus  was a small hiatal hernia with a benign-appearing proximal thin ring just  above it.  The scope did pass through with minimal resistance.  The stomach  was normal.  The scope was advanced through a normal antrum, pylorus, into a  normal duodenal bulb and around the C-loop to a normal second portion of the  duodenum.  The scope was withdrawn back to the bulb and a good look there  ruled out any abnormality identification.  The stomach was then re-evaluated  on straight and retroflexed visualization with a good look at the cardia,  fundus, angularis, lesser and greater curve and without any abnormalities  being seen.  The scope was then slowly withdrawn back to the posterior  pharynx.  No additional findings were seen.  The scope was reinserted back  to the antrum.  Customary position of the scope was confirmed under  fluoroscopy.  Savory wire was advanced.  Customary J-loop was again  confirmed.  The scope was removed, making sure to keep the wire in proper  position.  Not mentioned above when we withdrew the scope, there was some  early trauma to the ring with a minimal heme.  We  went ahead and proceeded  with the Savory dilatation in the customary fashion over the wire using  fluoroscopy guidance.  Savory 12, 14 and 15 were all advanced into the  stomach without any resistance.  There was minimal heme on the 14 and 15 mm  dilator.  Once the 15 was confirmed in the stomach, the wire was withdrawn  back into the dilator.  Both were removed in tandem.  The procedure was  terminated.  The patient tolerated the procedure well.  There were no  obvious immediate complications.   ENDOSCOPIC DIAGNOSES:  1. Small hiatal hernia with a proximal ring with some scope trauma in     passing the scope.  2. Otherwise normal EGD.   THERAPY:  Savory dilatation to the 15 mm under fluoroscopy.    PLAN:  Continue  pump inhibitors long term, more aggressive dilatation  p.r.n., follow up p.r.n. or in two months to recheck symptoms.  Make sure no  further work up plans are needed.                                               Petra Kuba, M.D.    MEM/MEDQ  D:  01/14/2003  T:  01/14/2003  Job:  [id number]   cc:   Marjory Lies, M.D.  [address]  [place]  [place] [address]  Fax: [phone]

## 2010-10-09 NOTE — Assessment & Plan Note (Signed)
[facility]   NAME:  [name] [name], [name] [name]                ACCOUNT NO.:  [id number]   MEDICAL RECORD NO.:  [id number]      DATE OF BIRTH:  1921/02/27   PHYSICIAN:  Theresia Majors. Tanda Rockers, M.D. VISIT DATE:  12/20/2005                                     OFFICE VISIT   SUBJECTIVE:  [name] [name] is an 77 year old man with a stasis ulceration on  the medial ankle.  In the interim, he has worn a compressive hose with  decrease in drainage and no pain.   OBJECTIVE:  His blood pressure is 119/62, respirations 20, pulse rate 72,  and he is afebrile.  Inspection of the wound shows that there are areas of  desquamation and some necrosis on the peripheral.  Following a full-  thickness debridement, there is obvious granulation tissue at the base of  the wound with an advance in periphery of healthy-appearing epithelium.  There was scant bleeding from the granulation tissue, which was easily  controlled with digital pressure.   ASSESSMENT:  Improved wound.   PLAN:  We will re-apply the patient's Unna boot.  We have advised him to  bring his support hose with him for his next visit, as we anticipate this  wound should be completely resolved.           ______________________________  Theresia Majors Tanda Rockers, M.D.     [name] [name]  D:  12/20/2005  T:  12/20/2005  Job:  [id number]

## 2010-10-09 NOTE — Assessment & Plan Note (Signed)
[facility]   NAME:  [name] [name], [name] [name]                ACCOUNT NO.:  [id number]   MEDICAL RECORD NO.:  [id number]            DATE OF BIRTH:   PHYSICIAN:  Theresia Majors. Tanda Rockers, M.D. VISIT DATE:  07/19/2006                                   OFFICE VISIT   VITAL SIGNS:  Blood pressure is 146/83, respirations 18, pulse rate 92,  temperature 98.   PURPOSE OF TODAY'S VISIT:  [name] [name] returns for followup of the  ulceration on the left medial ankle.  In the interim, he has been  managed with an Unna wrap and Prisma Matrix dressing.  He denies fever,  excessive drainage or malodor.  He continues on tapering doses of  prednisone of 5 mg every other day and also methotrexate for management  of his severe arthritis.  He has also started taking glucosamine.   WOUND EXAM:  Inspection of the left lower extremity shows that the  chronic changes of stasis, including hyperpigmentation are persistent.  There is no evidence of local infection.  The wound itself, dimension  wise, has not changed significantly, but the wound is clean.  There is  no evidence of drainage or malodor.   DIAGNOSIS:  Static stasis ulcer related to concurrent administration of  steroids and methotrexate.   MANAGEMENT PLAN & GOAL:  We will change from Prisma to the Promogran  product utilizing Hydrogel and a Unna wrap.  We will reevaluate him in 1  week.  His recent vascular studies show an ankle-brachial index of 1 and  his sensation remains intact.           ______________________________  Theresia Majors. Tanda Rockers, M.D.     [name]  D:  07/19/2006  T:  07/19/2006  Job:  [id number]

## 2010-10-09 NOTE — Assessment & Plan Note (Signed)
[facility]   NAME:  [name] [name], [name] NO.:  [name]   MEDICAL RECORD NO.:  [id number]            DATE OF BIRTH:   PHYSICIAN:  Maxwell Caul, M.D. VISIT DATE:  09/30/2006                                   OFFICE VISIT   PURPOSE OF TODAY'S VISIT:  Followup of venous stasis wound on the left  medial ankle.   WOUND EXAM:  The patient is afebrile.  I did a full thickness  debridement of a mild amount of eschar from the base of this wound.  Underneath, there was granulation, but no epithelization.  There was no  evidence of surrounding infection.   IMPRESSION:  Venous stasis ulcer.   PLAN:  We have continued the treatment as outlined by Dr. Tanda Rockers  initially.  Steri-Strip approximation with felt pad and ACE warp.  I  think he is continued on doxycycline.  The debridement was done without  anesthesia.  No hemostasis was necessary.  A number 15 blade was used.  We will see him again in 1 week.           ______________________________  Maxwell Caul, M.D.     MGR/MEDQ  D:  09/30/2006  T:  09/30/2006  Job:  [id number]

## 2010-10-09 NOTE — Assessment & Plan Note (Signed)
[facility]   NAME:  [name], [name]                ACCOUNT NO.:  [id number]   MEDICAL RECORD NO.:  [id number]      DATE OF BIRTH:  30-Jun-1920   PHYSICIAN:  Theresia Majors. Tanda Rockers, M.D.      VISIT DATE:                                   OFFICE VISIT   VITAL SIGNS:  Blood pressure is 120/80, respirations 16, pulse rate 86,  and he is afebrile.   PURPOSE OF TODAY'S VISIT:  Mr. [name] [name] was a 77 year old man who we were  treating for a left medial ankle ulceration.  He has had no significant  drainage, pain, fever, or malodor.   WOUND EXAM:  Inspection of the wound shows that there is really no  material decrease in the volume.  The wound is clean, appears to be  static.  There is no malodor.  We have now interrupted his methotrexate  into the third week, and we do not see a clear indication that the wound  has reverted to a contracted state.   DIAGNOSIS:  Static wound.   MANAGEMENT PLAN & GOAL:  We have added Promogran to the wound dressing.  We will continue external compression with an Unna wrap and reevaluate  the patient in 1 week.           ______________________________  Theresia Majors. Tanda Rockers, M.D.     [name]  D:  05/18/2006  T:  05/19/2006  Job:  [id number]

## 2010-10-09 NOTE — Assessment & Plan Note (Signed)
[facility]   NAME:  [name] [name], [name] [name] NO.:  [id number]   MEDICAL RECORD NO.:  [id number]      DATE OF BIRTH:  [birth year]   PHYSICIAN:  Maxwell Caul, M.D. VISIT DATE:  11/04/2006                                   OFFICE VISIT   LOCATION:  [facility].   PURPOSE OF TODAY'S VISIT:  Review of refractory ulcer on his medial left  ankle.  I had been treating him with Accuzyme and Unna.  He returns  today with no particular complaints.   On wound exam, the wound looks the same as usual except that there is  less tan eschar.  I removed what was there easily with a q-tip.  The  granulation under this looks satisfactory, but dry.  The depth of the  wound remains unchanged.  The periwound tissue looks like stasis with  good control of the edema.   WOUND CARE PLAN AND FOLLOWUP:  I have put some oasis in the wound and we  will continue with an Unna wrap.  Ultimately, we may need to proceed to  other advanced options.  This has been a nonhealing wound for a long  period of time now, refractory to matrix dressings, Regranex, etc.           ______________________________  Maxwell Caul, M.D.     MGR/MEDQ  D:  11/04/2006  T:  11/04/2006  Job:  [id number]

## 2010-10-09 NOTE — Assessment & Plan Note (Signed)
[facility]   NAME:  JUNIEL, GROENE                ACCOUNT NO.:  [id number]   MEDICAL RECORD NO.:  [id number]      DATE OF BIRTH:  30-Nov-1920   PHYSICIAN:  Theresia Majors. Tanda Rockers, M.D. VISIT DATE:  12/01/2005                                     OFFICE VISIT   VITAL SIGNS:  His vital signs are stable.  He is afebrile.   SUBJECTIVE:  Mr. Ghosh is an 77 year old man who has been seen for the  evaluation of a stasis ulcer on the medial aspect of his left lower  extremity.  During the interim he has worn a compression wrap.  He reports  that there has been scant drainage.  No fever, no pain.   OBJECTIVE:  Inspection of the wound shows that there is desquamated skin  around the periphery.  The wound was full-thickness debrided with healthy-  appearing granulation tissue that bled easily.  The hemorrhage was  controlled with direct pressure.  There is associated 2+ edema and chronic  changes of hyperpigmentation.  An Unna boot was placed without difficulty.   ASSESSMENT:  A slowly improving stasis ulcer, left medial ankle.   MANAGEMENT PLAN & GOAL:  We will reevaluate the patient in 10 days.           ______________________________  Theresia Majors Tanda Rockers, M.D.     [name]  D:  12/01/2005  T:  12/01/2005  Job:  [id number]

## 2010-10-09 NOTE — Assessment & Plan Note (Signed)
[facility]   NAME:  [name], [name] NO.:  [id number]   MEDICAL RECORD NO.:  [id number]      DATE OF BIRTH:  1921-05-19   PHYSICIAN:  Maxwell Caul, M.D. VISIT DATE:  10/21/2006                                   OFFICE VISIT   SUBJECTIVE:  The purpose of today's visit is follow-up of a recalcitrant  venostasis wound on the medial left ankle.  [name] [name] is a gentleman  who has been followed here for quite some time.  The patient has a  predominantly venostasis wound on the left medial ankle.  More recently,  he has been approximating the edges of this with Steri-strips, providing  a protective absorbent dressing and wrapping this on his own with an ace  wrap.  I looked back on some of his previous treatments.  I see that he  has at some point been treated with silver matrix dressing, with  Regranex and none of this has really resulted in healing.  He has had  many reasons for this including the chronic use of steroids and at one  point in time methotrexate.  He reports no pain or fever.   OBJECTIVE:  On examination, this wound looks much the same each time I  see it.  Current dimensions are 1.2 x 0.4 x 0.3.  The base of the wound  is covered with a tan-colored eschar that underwent an easy full-  thickness debridement with a #15 blade.  EMLA for anesthesia.  Hemostasis was with direct pressure.  Under the eschar is good  granulation tissue, however, the depth of this wound has remained  unchanged for some time.  The peri-wound tissue looks like stasis,  however, there is no evidence of infection.  There is 1 to 2+ edema.   WOUND CARE PLAN/FOLLOW UP:  I do not think this wound is improving.  Each time I see this, I debride a tan-colored eschar.  There is edema,  but no evidence of infection.  His peripheral pulses are well-palpated.  I have put Accuzyme in this wound to control the eschar and rewrapped  with a Profore.  Although this may  be revisiting some of the treatments  we have already used in the past, clearly this is becoming a non-healing  area.  I will review the chart to see if we have any other options and  to refresh my memory about what has been don in the past.           ______________________________  Maxwell Caul, M.D.     MGR/MEDQ  D:  10/21/2006  T:  10/21/2006  Job:  [id number]

## 2010-10-09 NOTE — Assessment & Plan Note (Signed)
[facility]   NAME:  [name] [name], [name] [name]                ACCOUNT NO.:  [id number]   MEDICAL RECORD NO.:  [id number]      DATE OF BIRTH:  April 23, 1921   PHYSICIAN:  Jonelle Sports. Sevier, M.D.  VISIT DATE:  10/21/2005                                     OFFICE VISIT   HISTORY:  This 77 year old black male is followed for a venous ulcer on the  left medial malleolar area which has been present for over two years.  He  also has a past history of these recurring for a number of years now.  It  bears mention that he is on methotrexate which has been a factor in his slow  healing.  Happily, he reports that the dosage has been reduced from 8  tablets per week to 4 per week recently, so that may help [place] somewhat in  getting this wound to complete its healing.   The patient says he has had no pain, he is unaware of any fever, new odor,  or other problems since his last visit.   VITAL SIGNS:  Blood pressure 126/80, heart rate 84 and regular, respirations  18, temperature 97.8.   WOUND EXAM:  The wound in the left medial malleolar area now measures 1 cm  in length, 0.3 cm in width, and 0.2 cm in depth.  There is minimal serous  drainage, about 15% slough in the wound base, no odor, somewhat thickened  wound margins, but without rolling of the skin.  It is estimated that this  wound is more than 75% epithelialized.   IMPRESSION:  Chronic venous ulceration left lower extremity, satisfactory  progress in the face of immunosuppression.   DISPOSITION:  1.  The wound is debrided today with significant abrasion of the skin      margins, deliberately intended to stimulate more progress toward wound      healing.   1.  The wound will be dressed with an application of hydrogel, covered with      a Silverlon pad, and the patient's extremity will be placed in a Pro-4      wrap from toes to knee to reduce venous hypertension and the tendency      toward edema.     ______________________________  Jonelle Sports. Cheryll Cockayne, M.D.     RES/MEDQ  D:  10/21/2005  T:  10/21/2005  Job:  [id number]

## 2010-10-09 NOTE — Assessment & Plan Note (Signed)
[facility]   NAME:  [name], [name]                ACCOUNT NO.:  [id number]   MEDICAL RECORD NO.:  [id number]      DATE OF BIRTH:  08-28-1920   PHYSICIAN:  Theresia Majors. Tanda Rockers, M.D.      VISIT DATE:                                   OFFICE VISIT   VITAL SIGNS:  Blood pressure is 123/71, respirations 18, pulse rate 90,  temperature is 98.3.   PURPOSE OF TODAY'S VISIT:  [name] is an 77 year old man who we have  followed for a recalcitrant ulcer on the left medial ankle.  We have  been treating him with external compression as well as Benzoin and Steri-  Strips in an attempt to pull a healthy-appearing wound together and  avoid distracting forces of edema.  In the interim, he denies fever or  pain.  He has had a flare-up of his osteoarthritis.  He is also  complaining of a nonproductive cough and phlegm in his throat.  He has  had no fever.   WOUND EXAM:  Examination of the left medial ankle shows that the wound  is essentially static.  There is a healthy-appearing granulating base,  but there is no advancement of the epithelium.  There is no evidence of  infection.  Pedal pulse remains palpable.   OTHER:  The HEENT exam is remarkable for some periorbital edema.  The  neck is supple.  The lungs are remarkable for expiratory wheezes and  rhonchi.  Heart sounds are normal.   MANAGEMENT GOALS AND PLANS:  With regard to the wound, we will continue  the external support with Benzoin and tape to juxtapose the separated  edges.  We will in addition add a topical compression Felt pad to offset  the affects of the edema.  To these efforts, we will reinforce with an  ACE bandage which can be removed daily for bathing and hygiene.  With  regard to his upper respiratory symptoms, we discussed [name] with  Dr. Clarene Duke at [facility] and we have agreed to start him on  doxycycline 100 mg p.o. b.i.d.  If the patient develops progressive  shortness of breath  or fever, he is to call [facility]  where for an interim  appointment, otherwise we will follow him up in  the wound clinic in two weeks.  We have explained this approach to the  patient in terms that he understands.  He has been given an opportunity  to ask questions.  He expresses gratitude for having been seen in the  clinic and indicates that he will be compliant including followup with  [facility].      Harold A. Tanda Rockers, M.D.  Electronically Signed     HAN/MEDQ  D:  09/15/2006  T:  09/15/2006  Job:  [id number]

## 2010-10-09 NOTE — Assessment & Plan Note (Signed)
[facility]   NAME:  EARLEY, GROBE                ACCOUNT NO.:  [id number]   MEDICAL RECORD NO.:  [id number]      DATE OF BIRTH:  May 29, 1920   PHYSICIAN:  Theresia Majors. Tanda Rockers, M.D.      VISIT DATE:                                   OFFICE VISIT   VITAL SIGNS:  Blood pressure is 113/73, respirations 18, pulse rate of  80, and he is afebrile.   PURPOSE OF TODAY'S VISIT:  Mr. Popoff returns for followup of an  ulceration on the medial aspect of the left ankle.  In the interim, he  has been treated with an external compression wrap and Promogran.  He  denies excessive drainage, malodor, pain, or fever.  He has also had an  interim ABI determined.   WOUND EXAM:  Inspection of the lower extremity shows that the ulcer has  contracted minimally as a result of the previous wrap and the Promogran.  The wound was debrided of nonviable tissue with scant hemorrhage control  with direct pressure.   WOUND SINCE LAST VISIT:   CHANGE IN INTERVAL MEDICAL HISTORY:   DIAGNOSIS:  Marginal improvement.   TREATMENT:   ANESTHETIC USED:   TISSUE DEBRIDED:   LEVEL:   CHANGE IN MEDS:   COMPRESSION BANDAGE:   OTHER:   MANAGEMENT PLAN & GOAL:  We will continue the external compression with  Promogran and reevaluate him in one week.           ______________________________  Theresia Majors. Tanda Rockers, M.D.    [name]  D:  06/02/2006  T:  06/02/2006  Job:  [id number]

## 2010-10-09 NOTE — Assessment & Plan Note (Signed)
[facility]   NAME:  [name] [name], [name] [name]                ACCOUNT NO.:  [id number]   MEDICAL RECORD NO.:  [id number]      DATE OF BIRTH:  1921/05/23   PHYSICIAN:  Jonelle Sports. Sevier, M.D.  VISIT DATE:  10/28/2005                                     OFFICE VISIT   HISTORY OF PRESENT ILLNESS:  This 77 year old black male is followed for a  fairly refractory venous stasis ulceration in the left medial malleolar  area. It has progressed consistently under our therapy but has been slow to  heal, being in an area of compromised skin.   The patient reports no change in symptomatology. Does have some chronic dull  pain in the area. It is not clear if this is exactly from the ulcer itself.  He has noted no unusual drainage, no odor, no fever, or other systemic  symptoms. He has had no change in medications.   PHYSICAL EXAMINATION:  VITAL SIGNS:  Blood pressure 120/70, heart rate is  72, respiratory rate 20, temperature 98.1.  EXTREMITIES:  The ulcer today is found to measure 1.5 x 0.5 x 0.2 cm. There  is a small amount of serous drainage, no odor, and approximately 40% slough  in the wound base. No eschar. Wound margins are intact.   IMPRESSION:  Gradual but satisfactory progressing chronic venous ulcer of  the left lower extremity.   DISPOSITION:  The wound is full thickness debrided of the slough without  need for anesthesia. The wound base is then quite clean.   The wound is treated with an application of Hydrogel, covered by a Silverlon  pad.   That extremity was then placed in a Profor wrap from the base of the toes,  to the tibial plateau area, to counter the venous hypertension and tendency  toward edema.   Followup visit here will be in 1 week.           ______________________________  Jonelle Sports. Cheryll Cockayne, M.D.     RES/MEDQ  D:  10/28/2005  T:  10/28/2005  Job:  [id number]

## 2010-10-09 NOTE — Assessment & Plan Note (Signed)
[facility]   NAME:  [name] [name], [name] [name]                ACCOUNT NO.:  [id number]   MEDICAL RECORD NO.:  [id number]            DATE OF BIRTH:   PHYSICIAN:  Theresia Majors. Tanda Rockers, M.D. VISIT DATE:  01/13/2006                                     OFFICE VISIT   SUBJECTIVE:  [name] [name] returns for follow up of his stasis results on the  medial aspect of his left lower extremity.  The patient has been  concurrently taking prednisone for his flare-up of arthritis and had also  been on small doses of methotrexate by his rheumatologist.  The wound has  been extremely slow to heal.  During the interim he has had scant drainage,  his compression has been maintained by his Unna boot.   OBJECTIVE:  Blood pressure 120/75, respirations 22, pulse rate 70 and is  afebrile.  Inspection of the ulceration shows that the wound is relatively  clean but there has been a failure at complete re-epithelialization.  The  wound is approximately 95% re-epithelialized and there is a moist exudate  over it.  This was mechanically debrided with a 4 x 4.   ASSESSMENT:  Slowly healing wound mostly likely related to steroids and  antimetabolites.   PLAN:  We will continue to keep the patient in compression wraps to avoid  further breakdown.  We anticipate that healing will be slow.  We will re-  evaluate the patient in one week.           ______________________________  Theresia Majors. Tanda Rockers, M.D.     [name] [name]  D:  01/13/2006  T:  01/13/2006  Job:  [id number]

## 2010-10-09 NOTE — Assessment & Plan Note (Signed)
[facility]   NAME:  [name] [name], [name] [name]                ACCOUNT NO.:  [id number]   MEDICAL RECORD NO.:  [id number]      DATE OF BIRTH:  [birth year]   PHYSICIAN:  Theresia Majors. Tanda Rockers, M.D. VISIT DATE:  02/02/2006                                     OFFICE VISIT   VITAL SIGNS:  Blood pressure is 132/82, respirations 18, pulse rate 72.   PURPOSE OF TODAY'S VISIT:  Mr. [name] is an 77 year old man with a stasis  ulcer on the medial aspect of his left lower extremity.  He was given a  prescription for Regranex 3 days ago but reports that the cost is  prohibitive.  We have recommended that he be given a sample of Regranex from  the office supplies.  He returns complaining of interim swelling because of  the absence of compression with some continued drainage from his ulceration.   WOUND EXAM:  Inspection of the lower extremity shows that the leg is indeed  edematous with 2-3+ edema and the skin is shiny.  The ulceration itself has  a covering of necrotic exudate which was debrided with a #10 blade.  We  instructed the patient in the use of the Regranex and the use of an ACE wrap  to avoid excessive compression.   DIAGNOSIS:  Stasis ulcer.   MANAGEMENT PLAN & GOAL:  We have initiated Regranex protocol.  We will  reevaluate the patient in 1 week.  We have discussed the specifics of  maintaining compression in terms that the patient seems to understand.  We  have given him an opportunity to ask questions.  He seems to be comfortable  with the Regranex protocol.  We will reevaluate him in 1 week.           ______________________________  Theresia Majors. Tanda Rockers, M.D.     [name]  D:  02/02/2006  T:  02/02/2006  Job:  [id number]

## 2010-10-09 NOTE — Assessment & Plan Note (Signed)
[facility]   NAME:  [name] [name], [name] [name]                ACCOUNT NO.:  [id number]   MEDICAL RECORD NO.:  [id number]          DATE OF BIRTH:   PHYSICIAN:  Jonelle Sports. Sevier, M.D.  VISIT DATE:  10/07/2005                                     OFFICE VISIT   HISTORY:  This 77 year old white male is seen in follow up of chronic and  recurrent ulceration of the medial aspect of the left ankle.  This is  clearly on the basis of venous insufficiency with venous hypertension and  edema.  It has responded well to treatment but has been a deep wound from  the beginning and so has been slow to heal.   Since last seen, the patient has remained in his prescribed Profore wrap.  Has had no unusual pain.  No other symptoms of concern.  He has no change in  his other medications.   EXAMINATION:  Blood pressure 118/72, heart rate 84 and regular, respirations  16, temperature 98.0.   The wound itself measures 1.5 x 0.7 x 0.25 cm and has minimal serous  drainage.  There is approximately 50% fibrinous slough in the wound base but  no black eschar and no undermining and no sinus tract.   IMPRESSION:  Venous wound left medial malleolar area, progressing nicely.   DISPOSITION:  1.  The wound is debrided of the fibrinous exudate as previously indicated      and is dressed by an application of Prisma dressing material, covered by      a Mepitel pad overlaid with Bactroban.  The left lower extremity is then      placed in the Profore wrap to the knee to control the venous      hypertension and edema.  2.  Follow-up visit here will be in one week.           ______________________________  Jonelle Sports. Cheryll Cockayne, M.D.     RES/MEDQ  D:  10/07/2005  T:  10/08/2005  Job:  [id number]

## 2010-10-09 NOTE — Assessment & Plan Note (Signed)
[facility]                             PULMONARY OFFICE NOTE   NAME:[name] [name], [name] [name]                       MRN:          [id number]  DATE:08/08/2006                            DOB:          03-15-1921    PROBLEM LIST:  1. Dyspnea/chronic obstructive pulmonary disease.  2. Gout.  3. Rheumatoid arthritis.  4. Peripheral venous stasis disease with skin ulcer.   HISTORY:  He returns for followup, feeling stable.  D-dimer was  increased apparently because of his leg ulcer with negative workup for  DVT/pulmonary embolism.  Spiriva has made no difference.  He is still  working 40 hours a week.  Breathing does not wake him.  We discussed lab  including anemia.  He had had a colonoscopy by Dr. Ewing Schlein two years ago  and is not aware of blood loss.   MEDICATIONS:  1. Methotrexate 5 tabs b.i.d. every seven days.  2. Allopurinol 300 mg.  3. Lisinopril 20 mg times one-half.  4. Folic acid.  5. Prednisone 5 mg daily.  6. Multivitamins.  7. Aspirin 81 mg.  8. Calcium with vitamin D.  9. Glucosamine.  10.Combivent rescue inhaler.   ALLERGIES:  CONTRAST DYE.   OBJECTIVE:  Weight 205 pounds.  Blood pressure 122/70.  Pulse regular,  89.  Room air saturation 98 percent.  Quiet, clear chest, unlabored  breathing.  Heart sounds are regular without murmur.  There is no neck  vein distention or stridor.  Legs are wrapped.   LABORATORY DATA:  Results as previously reviewed.  Pulmonary function  tests had shown mild obstructive disease with insignificant response to  bronchodilator, mildly reduced diffusion, and normal lung volumes.  This  would not be predicted to cause the oxygen desaturation noted from 94 to  87 percent at the end of his six minute walk test.  He had covered 357  meters in six minutes with heart rate rising from 88 to 114.  Two  minutes after ending the study, his saturation was back to 95 percent.  Hemoglobin was 11 with hematocrit 33.8.  White  blood count 7400.  B-  natriuretic peptide was 15.  Red cell indices had been normal.   IMPRESSION:  Dyspnea with oxygen desaturation during exercise suggesting  ventilation perfusion mismatch aggravated by mild anemia.  He does have  peripheral venous insufficiency.   PLAN:  1. Dr. Doristine Counter is alerted to watch his anemia, recognizing this may      reflect his rheumatoid arthritis as an anemia of chronic disease,      especially with his immunosuppressive therapies.  2. Schedule return in four months.  Consider need for an      echocardiogram.  He is encouraged to keep walking.     Clinton D. Maple Hudson, MD, Tonny Bollman, FACP  Electronically Signed    CDY/MedQ  DD: 08/13/2006  DT: 08/13/2006  Job #: [id number]   cc:   Marjory Lies, M.D.  Maxwell Caul, M.D.  Petra Kuba, M.D.

## 2010-10-09 NOTE — Assessment & Plan Note (Signed)
[facility]   NAME:  [name] [name], [name] [name] NO.:  [id number]   MEDICAL RECORD NO.:  [id number]      DATE OF BIRTH:  07/07/1920   PHYSICIAN:  Maxwell Caul, M.D. VISIT DATE:  11/11/2006                                   OFFICE VISIT   [name] returns today for review of refractory ulcer on the medial  left ankle.  Most recently I applied Oasis to this wound.  It has been  covered with an Unna to control edema.  He returns today with no  particular complaints.   Temperature 97.9,  pulse 83, respirations 18, blood pressure 115/68.  Dimensions of the left ankle wound measure 1 x 0.3 x 0.4.  All of these  are slightly improved versus last time.  He underwent a full-thickness  debridement with a #15 blade.  No anesthesia was necessary.  Hemostasis  was with direct pressure.  However, I think the base of this wound looks  better.  It is now appearing somewhat granulated and healthy looking,  versus what I am used to seeing.   IMPRESSION:  Improved largely venous stasis ulcer on the left medial  ankle.  I will continue Oasis with Unna wrap.  If the dimensions of this  wound are slightly improved, then this is the first time I have seen a  general visual improvement in the status of this wound.  There is no  evidence of cellulitis.  As mentioned, the drainage and eschar seem  improved.           ______________________________  Maxwell Caul, M.D.     MGR/MEDQ  D:  11/11/2006  T:  11/12/2006  Job:  [id number]

## 2010-10-09 NOTE — Assessment & Plan Note (Signed)
[facility]   NAME:  [name] [name], [name] [name]                ACCOUNT NO.:  [id number]   MEDICAL RECORD NO.:  [id number]            DATE OF BIRTH:   PHYSICIAN:  Theresia Majors. Tanda Rockers, M.D. VISIT DATE:  05/26/2006                                   OFFICE VISIT   VITAL SIGNS:  Blood pressure is 120/64, respirations are 14, pulse rate  74, temperature 97.6.   PURPOSE OF TODAY'S VISIT:  [name] [name] is an 77 year old man who have  followed for several months for management of a medial ulceration on the  left malleolus.  In the interim, we have treated him with a Matrix  dressing and compression wrap.  He had previously been on doses of  methotrexate for severe arthritis.  This, we have asked him to hold over  the past 4 weeks.  He denies excessive pain, malodorous drainage, fever  or crippling, arthritic symptomatology.   WOUND EXAM:  Inspection of the wound shows that there is a clean  granulating base discernable after a full thickness debridement of  necrotic tissue.  Minimum hemorrhage was controlled with direct  pressure.  The wound measurements are slightly enlarged.  There is a  persistence of 1+ edema, the pedal pulse remains 2+ palpable.   DIAGNOSIS:  Extremely slow healing wound in spite of multiple therapies,  including Regranex Matrix dressings and compression.   MANAGEMENT PLAN & GOAL:  We will continue Promogran dressing with an  Unna wrap.  In addition, we will recheck his arterial Doppler to rule  out significant flow disturbance.   We still favor a hold on his methotrexate as this may be the major  factor in this patient's slow healing.  We will forward a copy of this  note to Dr. Kellie Simmering and request his input as we continue to manage this  difficult wound.           ______________________________  Theresia Majors Tanda Rockers, M.D.     [name] [name]  D:  05/26/2006  T:  05/26/2006  Job:  [id number]   cc:   Aundra Dubin, M.D.

## 2010-10-09 NOTE — Assessment & Plan Note (Signed)
[facility]   NAME:  [name] [name], [name] [name]                ACCOUNT NO.:  [id number]   MEDICAL RECORD NO.:  [id number]      DATE OF BIRTH:  10-Jun-1920   PHYSICIAN:  Theresia Majors. Tanda Rockers, M.D. VISIT DATE:  11/05/2005                                     OFFICE VISIT   SUBJECTIVE:  Mr. Baney returns for followup of his stasis ulcer of his  left medial lower extremity. Since his last visit, he has worn compression  hose. He reports no significant drainage, pain, or fever.   OBJECTIVE:  VITAL SIGNS:  Stable. He is afebrile.  EXTREMITIES: Inspection of the wound shows that there is persistence of 2 to  3+ edema. The wound has contracted significantly. There is 100% epithelium  over the wound. There is scant drainage. No debridement is necessary.   ASSESSMENT:  Improvement.   PLAN:  We will continue an [place] boot for an additional 2 weeks. WE have given  the patient a prescription. The patient has bilateral below the knee  compression hose. He is to bring them with him for his next visit. We  anticipate that he will be healed and we will make the transition to wearing  support hose only.           ______________________________  Theresia Majors Tanda Rockers, M.D.     [name]  D:  11/05/2005  T:  11/05/2005  Job:  [id number]

## 2010-10-09 NOTE — Assessment & Plan Note (Signed)
[facility]   NAME:  GOVERNOR, MATOS                ACCOUNT NO.:  [id number]   MEDICAL RECORD NO.:  [id number]            DATE OF BIRTH:   PHYSICIAN:  Theresia Majors. Tanda Rockers, M.D. VISIT DATE:  08/22/2006                                   OFFICE VISIT   PURPOSE OF TODAY'S VISIT:  Mr. Kent is an 77 year old man who we  follow with a stasis ulcer on the medial aspect of his left ankle.  He  is concurrently receiving methotrexate.  In the past, we have treated  him with benzoin and half inch Steri-Strips to loosely approximate his  wound, hoping that a decrease in the distracting evacuees at the skin  layer would promote closure.  He returns for followup.  There has been  no interim, drainage, malodor, pain or fever.   WOUND EXAM:  Steri-Strips were adherent.  They were removed without  difficulty.  The wound bed is clean.  It shows healthy granulation and  has contracted by measurement.  A photograph and measurements were taken  and entered into the wound expert.   DIAGNOSIS:  Clinical improvement.   MANAGEMENT PLAN & GOAL:  We will continue the benzoin and Steri-Strip  approximation with a Silverlon Swath.  We will reevaluate the patient in  10 days p.r.n.  We have counseled the patient specifically that the  Steri-Strip approximation may result in increased drainage, fever or  pain.  If any of this is to occur or if he notices any malodor, he is to  call the clinic for an interim appointment.  Otherwise, we will see him  at the 10 day interval.  Patient seems to understand and indicates that  he will be compliant.      Harold A. Tanda Rockers, M.D.  Electronically Signed     HAN/MEDQ  D:  08/22/2006  T:  08/22/2006  Job:  [id number]

## 2010-10-09 NOTE — Assessment & Plan Note (Signed)
[facility]   NAME:  [name] [name], [name] [name]                ACCOUNT NO.:  [id number]   MEDICAL RECORD NO.:  [id number]      DATE OF BIRTH:  1921-05-17   PHYSICIAN:  Theresia Majors. Tanda Rockers, M.D. VISIT DATE:  01/31/2006                                     OFFICE VISIT   SUBJECTIVE:  Mr. Mcvay returns for followup of a left medial malleolus  ulcer.  In the interim, he has denied excessive pain or drainage.   OBJECTIVE:  VITAL SIGNS:  Blood pressure 130/80, respirations 20, pulse rate  74, and he is afebrile.   Inspection of the wound shows that, in spite of all of our efforts, this  wound still remains open.  It has an elliptical configuration with a  transverse diameter of 0.7 cm and a length of 1.5 cm.  A full-thickness  debridement of the wound was performed in the clinic with minimal hemorrhage  control with direct pressure.   ASSESSMENT:  Chronic wound.   PLAN:  We have given the patient a prescription for Regranex. We will  initiate Regranex protocol.  We have instructed the patient to purchase this  medication and make an appointment as soon as he receives it to come back to  clinic, and we will show him how to use it.  The patient seems to  understand.  He indicates that he will leave the clinic and make  arrangements to get the Regranex shortly.           ______________________________  Theresia Majors. Tanda Rockers, M.D.     [name]  D:  01/31/2006  T:  01/31/2006  Job:  [id number]

## 2010-10-09 NOTE — Assessment & Plan Note (Signed)
[facility]   NAME:  [name] [name], [name] [name]                ACCOUNT NO.:  [id number]   MEDICAL RECORD NO.:  [id number]      DATE OF BIRTH:  Dec 12, 1920   PHYSICIAN:  Theresia Majors. Tanda Rockers, M.D.      VISIT DATE:                                   OFFICE VISIT   VITAL SIGNS:  Blood pressure 130/80, respirations 16, pulse rate 78, and  he is afebrile.   PURPOSE OF TODAY'S VISIT:  [name] is an 77 year old man who has had  a recalcitrant ulcer on the medial aspect of his left medial ankle.  He  had concurrently been treated with methotrexate for severe degenerative  disease and arthritis, and we had asked him to hold that medication in  an attempt to improve the healing of his wounds.  He in addition  continued on Regranex.   WOUND EXAM:  Inspection of the lower extremity shows that there is 3+  edema.  There is a moist exudate over the wound which we sharply  debrided down to healthy bleeding tissue with hemorrhage control with  direct pressure.   WOUND SINCE LAST VISIT:  In the interim, he reports that there has been  increased swelling, but there has been no pain and no malodorous  drainage.   DIAGNOSIS:  Uncontrolled edema with persistent stasis ulcer.   MANAGEMENT PLAN & GOAL:  We are discontinuing his Regranex protocol  temporarily.  We will reapply an Unna boot to control his edema, and we  will reevaluate the patient in one week.           ______________________________  Theresia Majors. Tanda Rockers, M.D.     [name]  D:  05/11/2006  T:  05/11/2006  Job:  [id number]

## 2010-10-09 NOTE — Assessment & Plan Note (Signed)
[facility]   NAME:  KYSHAWN, TEAL                ACCOUNT NO.:  [id number]   MEDICAL RECORD NO.:  [id number]      DATE OF BIRTH:  [birth year]   PHYSICIAN:  Theresia Majors. Tanda Rockers, M.D.      VISIT DATE:                                   OFFICE VISIT   VITAL SIGNS:  Blood pressure is 140/80, respirations 18, pulse rate 76,  and he is afebrile.   PURPOSE OF TODAY'S VISIT:  Mr. Pinard returns for follow up of a stasis  ulcer on the medial aspect of the left lower extremity.  In the interim  he has been treated with compression, serial debridement's, interim  biopsies, and Protopic ointment.  The patient reports no excessive pain,  malodor, or drainage.   WOUND EXAM:  Inspection of the wound shows that there is a central area  of necrosis, which required a full thickness debridement with hemorrhage  control with direct pressure.   DIAGNOSIS:  Static stasis ulcer.   MANAGEMENT PLAN & GOAL:  We will ask that the patient hold his  methotrexate for 4 to 6 weeks as I feel that this is most likely the  reason why this wound has been so resistant to primary closure.  We have  discussed this approach with a Mr. Casanas in terms that he seems to  understand.  If his arthritis becomes intolerable, we will reevaluate  him and will suggest  an alternative to the methotrexate as this is  really the culprit in my opinion as to why this wound has been so slow  in healing.  In the meantime we will resume the matrix silver dressing,  hydrogel, and an Una-wrap.  We will reevaluate him in 1 week.           ______________________________  Theresia Majors. Tanda Rockers, M.D.     [name]  D:  04/20/2006  T:  04/20/2006  Job:  [id number]

## 2010-10-09 NOTE — Assessment & Plan Note (Signed)
[facility]   NAME:  [name] [name], [name] [name]                ACCOUNT NO.:  [id number]   MEDICAL RECORD NO.:  [id number]      DATE OF BIRTH:  1920-06-09   PHYSICIAN:  Theresia Majors. Tanda Rockers, M.D. VISIT DATE:  11/22/2005                                     OFFICE VISIT   SUBJECTIVE:  [name] returns for a followup of a left medial stasis  ulcer.  During the interim he has been wearing an Unna wrap.  He denies  fever.  There has been decrease in the drainage, and there is no malodor.   OBJECTIVE:  The vital signs are stable.  Blood pressure 115/60, pulse rate  of 76, respirations of 18, and he is afebrile.  Inspection of the wound shows that there is healthy granulation of the base.   The measurements were entered into the Wound Expert per the nurse.   ASSESSMENT:  Improved.   PLAN:  We will reapply the Unna wrap and reevaluate the patient in 7-10  days.           ______________________________  Theresia Majors Tanda Rockers, M.D.     [name]  D:  11/22/2005  T:  11/22/2005  Job:  [id number]

## 2010-10-09 NOTE — Consult Note (Signed)
NAME:  [name] [name], [name] [name] NO.:  [id number]   MEDICAL RECORD NO.:  [id number]          PATIENT TYPE:   LOCATION:                                 FACILITY:   PHYSICIAN:  Jonelle Sports. Sevier, M.D.      DATE OF BIRTH:   DATE OF CONSULTATION:  08/03/2005  DATE OF DISCHARGE:                                   CONSULTATION   HISTORY:  This pleasant 77 year old black male is seen at the courtesy of  Dr. Doristine Counter for assistance with management of a chronic ulcer of the left  lower extremity.   The patient reports that he has had difficulty with recurrent venous ulcers  on his lower extremities for over 30 years and that he has worn compression  stockings for most of that time.   Most recently, he had noted the development of two lesions on the left lower  extremity-one medially just supramalleolar and one laterally on the lateral  aspect of the foot just distal to the lateral malleolus. These have somewhat  come and gone but have been basically active for about three years. By the  time of his arrival here, the one laterally is draining minimally if at all.   It bears mentioning that the patient has a history of rheumatoid arthritis  and has been on methotrexate for a number of years. He has also  intermittently been on prednisone and has recently been placed back on this  at a dose of 20 milligrams per day presently. This is for a flare in his  rheumatoid arthritis.   He did have a wound culture done on July 16, 2005 which showed a  Chromobacter species, heavy growth with limited sensitivities. He was  treated with a course of a Septra DS to which the organism was sensitive and  completed this on July 30, 2005.   He is here now for our further evaluation and advice.   PAST MEDICAL HISTORY:  Notable for the rheumatoid arthritis as previously  mentioned. He also has a history of gout and hypertension. He fractured his  left tibia some 60 years ago and was a former  smoker of one pack per day  until 1989, none since that time. He denies use of alcoholic beverages.   He has no known medicinal allergies.   His regular medications include lisinopril, Flomax, folic acid, calcium,  vitamin D, multiple vitamin, baby aspirin, Combivent 2 puffs q.i.d. and  methotrexate 7.5 milligrams weekly on Saturdays and prednisone 20 milligrams  daily.   EXAMINATION:  Examination today is limited to the distal lower extremities.  The right lower extremity shows evidence of mild to moderate chronic venous  insufficiency with minimal edema and with no open ulcerations. There are no  deformities there and pulses are quite adequate. Surprisingly, he lacks  protective sensation in the right forefoot. There will be no lesions there.  This foot will not be further considered.   The left lower extremity is characterized by slightly more edema; again with  evidence of chronic venous disease with some pigmentary changes and chronic  scarring particularly in the medial supramalleolar area. Pulses are again  palpable and quite strong. Monofilament testing there shows variable  reactions in the toe with intact sensation elsewhere in the foot.   On the medial aspect of that extremity just proximal to the medial malleolus  is an elliptical ulcer measuring 2 cm in length 7/10th cm in width and  approximately 2/10th cm in depth. It is covered with a shaggy yellow  exudate. There is no odor. There is no evidence of surrounding infection.   The lesion indicated on the lateral aspect of the left heel where there had  been an ulcer appears at this point completely healed with no wound to  describe there.   DISPOSITION:  1.  It is discussed with the patient that the methotrexate and now the      prednisone are doubtless factors in the difficulty healing such lesions.      Also, he has previously used alcohol and peroxide in these ulcers and he      is cautioned that those are not wise  agents to use, which information he      has already received and seems to have understood from his primary care      physician.  2.  The wound medial aspect of the distal left lower extremity is sharply      debrided of its slough which is fibrinopurulent in nature and as result      is reasonably clean, although there is still some adherent fibrinous      slough in the base. Accordingly, the wound is treated with an      application of Panafil, covered by an absorptive pad and a Profore wrap      of that extremity is placed. The patient is cautioned against getting      this dressing wet or otherwise compromising it. I have some concerns in      that he prefers to continue working and is in a position where he      apparently is required to wear a steel capped shoe which will not fit      over this wrap. He is asked to check with his employer and see if      possibly there is some temporary prolonged protective device that he      might use until such time he is out of the bulky wrap.  3.  Follow-up visit is given here for one week hence.           ______________________________  Jonelle Sports. Cheryll Cockayne, M.D.     RES/MEDQ  D:  08/03/2005  T:  08/04/2005  Job:  [id number]   cc:   Marjory Lies, M.D.  Fax: [phone]

## 2010-10-09 NOTE — Assessment & Plan Note (Signed)
[facility]   NAME:  GERMAN, MANKE                ACCOUNT NO.:  [id number]   MEDICAL RECORD NO.:  [id number]      DATE OF BIRTH:  30-Nov-1920   PHYSICIAN:  Theresia Majors. Tanda Rockers, M.D.      VISIT DATE:                                   OFFICE VISIT   SUBJECTIVE:  Mr. Amsler returns for follow up of a nonhealing  ulceration on the left medial ankle.  He has been instructed to  discontinue the use of his methotrexate, which he has done.  He has now  been 2 weeks off of the methotrexate.  He denies any exacerbation of his  arthritis.  He denies excessive drainage, malodor, pain, or fever.   OBJECTIVE:  Blood pressure is 120/80, respirations 18, pulse rate 80,  temperature 97.9.  Inspection of the wound shows that there is a residual necrotic tissue  at the periphery.  This was debrided using a 15-blade with hemorrhage  control with direct pressure.  The wound was irrigated with saline.   ASSESSMENT:  Static wound.   PLAN:  We are reinitiating the Regranex protocol to be placed b.i.d.  We  will reevaluate him in 1 week.           ______________________________  Theresia Majors. Tanda Rockers, M.D.     [name]  D:  05/04/2006  T:  05/05/2006  Job:  [id number]

## 2010-10-09 NOTE — Assessment & Plan Note (Signed)
[facility]                             PULMONARY OFFICE NOTE   NAME:[name] [name], [name] [name]                       MRN:          [id number]  DATE:06/20/2006                            DOB:          1920-08-27    PROBLEMS:  Pulmonary consultation at the kind request of Dr. Rosezetta Schlatter  for this 77 year old gentleman concerned about exertional dyspnea.   HISTORY OF PRESENT ILLNESS:  He was told 20 years ago that he had  breathing problems and was given an inhaler for awhile at that time.  Now, in the past 1-2 years, he has been given a Combivent inhaler from  the [facility] hospital which he says maybe helps some.  He is noticing dyspnea  only with exertion such as walking and activities of daily living.  It  is eased by rest, and it is not a problem when he is sitting or lying in  bed.  It is not affected by season or weather changes.  He denies cough  or wheeze, and he denies chest pain.  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  He admits that he is getting a partial disability  compensation through the [place] and hopes to increase that.   MEDICATIONS:  1. Methotrexate five tablets b.i.d. every seven days.  2. Allopurinol 300 mg.  3. Lisinopril 20 mg times one half.  4. Folic acid.  5. Prednisone 5 mg.  6. Aspirin 81 mg.  7. Calcium and vitamin D.  8. Combivent inhaler used occasionally.   ALLERGIES:  No medication allergies.   REVIEW OF SYSTEMS:  Weight has been stable.  Feet occasionally swell.  No cough, palpitations, exertional or pleuritic chest pain, discolored  sputum, acid indigestion, change in bowel or bladder.  No rash or  adenopathy.  Joint pains of his arthritis.   PAST MEDICAL HISTORY:  1. Gout.  2. Rheumatoid arthritis.  3. Compound fracture of the left tibia in [employer] service.  He has      been followed at the hyperbaric center, but has not been given  hyperbaric therapy to treat a chronic ulcer over this area of old      injury.  4. Denies any history of clots in his legs and says he has had a      Doppler study of his legs.  5. Denies history of deep vein thrombosis, pulmonary embolism, asthma      or pneumonia, and he denies exposure to tuberculosis.   PAST SURGICAL HISTORY:  Hernia repair.   SOCIAL HISTORY:  He smoked one pack per day 10-15 years, quitting about  30 years ago.  Quit alcohol 1946.  Married but working part time at [employer] where he sits.  No exposure to cutting oils or methyl-  working fluids.   FAMILY HISTORY:  Rheumatism.  Negative for lung disease.   OBJECTIVE:  VITAL SIGNS:  Weight 204 pounds, blood pressure 124/80,  pulse regular and 80, room air saturation 99%.  NECK:  No jugular venous distention.  EXTREMITIES:  Bilateral edema 1+ in the feet, despite wearing elastic  hose.  No cyanosis, clubbing.  LUNGS:  Sounds are diminished with faint crackles in the lower third  bilaterally.  Work of breathing is not increased.  There is no cough or  stridor.  HEART:  Sounds are regular without murmur.  I do not find adenopathy.   IMPRESSION:  Dyspnea, nonspecific.  I cannot exclude primary heart or  lung disease, complications of his rheumatoid disease or something else.  We need objective information, and I recognize some of his  interpretations may be colored by his desire to increase his disability  status.   PLAN:  Blood is requested for BNP and a CBC to exclude anemia.  Chemistry panel, chest x-ray, scheduled pulmonary function test with 6-  minute walk.  Return in two weeks.  Consider a CT scan to rule out  chronic pulmonary embolism and to rule out asbestosis.  I appreciate the  chance to meet him.     Clinton D. Maple Hudson, MD, Tonny Bollman, FACP  Electronically Signed    CDY/MedQ  DD: 06/20/2006  DT: 06/21/2006  Job #: [id number]   cc:   Marjory Lies, M.D.

## 2010-10-09 NOTE — Assessment & Plan Note (Signed)
[facility]   NAME:  DEZMEN, ALCOCK                ACCOUNT NO.:  [id number]   MEDICAL RECORD NO.:  [id number]            DATE OF BIRTH:   PHYSICIAN:  Theresia Majors. Tanda Rockers, M.D. VISIT DATE:  07/12/2006                                   OFFICE VISIT   VITAL SIGNS:  Blood pressure 123/79, respirations 28, pulse rate 94,  temperature 97.7.   PURPOSE OF TODAY'S VISIT:  Mr. Cueva returns for followup of an  ulceration on his left medial ankle.  Since the last visit, he has  undergone a duplex scan at the request of Dr. Fannie Knee to rule out  the possibility of a pulmonary embolus.  The patient was seen by Dr.  Maple Hudson on February 18 and that scan was interpreted as negative.  He has  denied increasing pain or excessive drainage.  He continues to be  ambulatory without limiting or exacerbation of pain.   WOUND EXAM:  Inspection of the left medial wound shows that there is a  retension of the previous Prisma.  This was removed with a 15-blade  showing healthy-appearing granulation.  The wound was irrigated with a  blunt needle, jet of normal saline.  A new Prisma dressing was applied  with hydrogel.  His pulses remained readily palpable.  There is  associated 2+ edema.  A new Unna boot was applied.   DIAGNOSIS:  Clinical improvement of stasis ulcer while the patient  continues on methotrexate.   MANAGEMENT PLAN & GOAL:  We have instructed the patient that he will be  able to remove the Unna boot on the day of his next wound center visit  in order to thoroughly wash his leg.  We have also instructed him that  he may agitate the Prisma.  We will reevaluate him in 1 week p.r.n.           ______________________________  Theresia Majors. Tanda Rockers, M.D.     [name]  D:  07/12/2006  T:  07/13/2006  Job:  [id number]

## 2010-10-09 NOTE — Assessment & Plan Note (Signed)
[facility]   NAME:  [name] [name], [name] [name]                ACCOUNT NO.:  [id number]   MEDICAL RECORD NO.:  [id number]            DATE OF BIRTH:   PHYSICIAN:  Theresia Majors. Tanda Rockers, M.D. VISIT DATE:  09/01/2006                                   OFFICE VISIT   VITAL SIGNS:  Blood pressure 113/65, respirations 20, pulse rate 93,  temperature 97.1.   PURPOSE OF TODAY'S VISIT:  [name] returns for followup of a  recalcitrant stasis ulcer.  Most recently, we have treated him with  benzoin and Steri-Strips to oppose a gap in wound on the posterior medal  malleolus.  He returns for followup.  There has been no excessive  drainage, malodor, pain or fever.   WOUND EXAM:  Inspection of the left lower extremity shows that there is  2+ edema.  The wound, itself, has shown evidence of decreased volume.  The areas of desquamation were mechanically debrided and irrigated with  a large bore needle to agitate and effect the hydrostatic debridement.  The wound appears to be covered with granulation tissue that appears to  be healthy.  There is no evidence of ischemia.  There is no evidence of  ascending infection or cellulitis.   DIAGNOSIS:  Clinical improvement.   MANAGEMENT PLAN & GOAL:  We will continue the external support with the  benzoin and Steri-Strips.  We will continue compression with a Profore  and reevaluate the patient in 10-14 days.      Harold A. Tanda Rockers, M.D.  Electronically Signed     HAN/MEDQ  D:  09/01/2006  T:  09/01/2006  Job:  [id number]

## 2010-10-09 NOTE — Assessment & Plan Note (Signed)
[facility]   NAME:  WILMONT, OLUND                ACCOUNT NO.:  [id number]   MEDICAL RECORD NO.:  [id number]      DATE OF BIRTH:  February 08, 1921   PHYSICIAN:  Theresia Majors. Tanda Rockers, M.D. VISIT DATE:  02/23/2006                                     OFFICE VISIT   Mr. Tanda Rockers returns for follow up of a stasis ulceration on the medial  aspect of his left lower extremity.  During the interim he has been treated  with the Regranex protocol.  He denies increasing drainage, pain and fever.  The patient feels that the wound has improved significantly.   OBJECTIVE:  Blood pressure is 142/78, respirations 16, pulse 80 and regular  and temperature is 98.7.  Inspection of the wound shows that there is  continued contraction, there is scant drainage, there is advancement of  epithelium.   IMPRESSION:  Improved wound secondary to Regranex treatment.   PLAN:  Will continue Regranex protocol and re-evaluate the patient in 1  month.           ______________________________  Theresia Majors. Tanda Rockers, M.D.     [name]  D:  02/23/2006  T:  02/24/2006  Job:  [id number]

## 2010-10-09 NOTE — Assessment & Plan Note (Signed)
[facility]   NAME:  [name] [name], [name] [name]                ACCOUNT NO.:  [id number]   MEDICAL RECORD NO.:  [id number]      DATE OF BIRTH:  July 11, 1920   PHYSICIAN:  Jonelle Sports. Sevier, M.D.  VISIT DATE:  09/30/2005                                     OFFICE VISIT   This 77 year old black male is followed for management of a venous ulcer on  the medial aspect of the left ankle.  This has been an ulcer that has been  recurrent over the past 50 years, but the current situation has seemingly  been responding well to therapy.   The patient reports that he has had no real problems since his last visit  here with no increase in pain, no fever or systemic symptoms, and no reason  to think that the ulcer was doing anything but satisfactorily.   On examination today, blood pressure is 118/62, pulse 72, respirations 18,  temperature 97.9.   The wound itself on the medial supramalleolar aspect of the left lower  extremity now measures 1.5 x 0.6 x 0.3 cm in dimension.  There is no sinus  tract, no undermining and minimal slough approximately 20% in the base of  the wound.  The previously applied Prisma is still seen in part as well.  There is no eschar.  The wound margins are a bit thickened but seem to be  advancing well, and the wound is estimated to be greater than 75%  epithelialized.   DIAGNOSIS:  Chronic venous insufficiency with edema and recurrent stasis  ulceration left medial ankle.   DISPOSITION:  The wound is debrided of a very small amount of slough, this  being really in essence only a partial-thickness debridement, and an  application of Prisma is again carried out.  This is followed with a  covering of Mepitel and Bactroban.  The extremity is then placed in a  Profore wrap.  Following visit will be here in one week.           ______________________________  Jonelle Sports. Cheryll Cockayne, M.D.     RES/MEDQ  D:  09/30/2005  T:  10/01/2005  Job:  [id number]

## 2010-10-09 NOTE — Assessment & Plan Note (Signed)
[facility]   NAME:  [name] [name], [name] [name]                ACCOUNT NO.:  [id number]   MEDICAL RECORD NO.:  [id number]      DATE OF BIRTH:  1921-01-22   PHYSICIAN:  Theresia Majors. Tanda Rockers, M.D.      VISIT DATE:                                     OFFICE VISIT   SUBJECTIVE:  [name] [name] is an 77 year old man with a medial ulceration on  the left ankle.  In the interim, he has been treated with compression wrap.  He has made continuous but slow progress.  He denies fever, excessive pain,  or malodor.   OBJECTIVE:  VITAL SIGNS:  His vital signs are 110/70, respirations 20, pulse  rate 88.  He is afebrile.  SKIN:  Inspection of the wound shows that there is still a residual  granulation without reepithelialization.  We have used a remnant of Apligraf  and placed in this wound, covered by a nonadherent swath and hydrogel.  We  will replace the patient in a Unna boot and re-evaluate the wound in 10  days.  Overall, the wound appears to be extremely slow in final closure, but  there has been no retrogression.   PLAN:  We will re-evaluate in 10 days, as per above.   In addition, the patient has requested a medical evaluation to have  assessment of his chronic wound, to be shared with the [employer] as part of his appeal of a disability determination.  We have  informed the patient that we will be happy to provide a medical records but  he would have to provide [place] with the form and the direction, as directed by  his [facility] case worker.  We will act as per requested by the patient and the  [facility] system.           ______________________________  Theresia Majors Tanda Rockers, M.D.     [name]  D:  01/20/2006  T:  01/20/2006  Job:  [id number]

## 2010-10-09 NOTE — Assessment & Plan Note (Signed)
[facility]   NAME:  [name] [name], [name] [name]                ACCOUNT NO.:  [id number]   MEDICAL RECORD NO.:  [id number]      DATE OF BIRTH:  1921-04-29   PHYSICIAN:  Theresia Majors. Tanda Rockers, M.D. VISIT DATE:  08/09/2006                                   OFFICE VISIT   VITAL SIGNS:  Blood pressure is 122/72, respirations 24, pulse rate 84,  temperature 97.8.   PURPOSE OF TODAY'S VISIT:  [name] returns for followup of a stasis  ulcer on his medial left lower extremity.  In the interim, he denies  excessive pain, malodor, excessive drainage, or progressive discomfort  in any way.  He continues to be ambulatory.   WOUND EXAM:  Inspection of the left medial ankle shows that the wound is  essentially clean.  There is adherent previous prisma, which has been  removed.  There appears to be easily bleeding granulation tissue.  The  wound was irrigated and thoroughly dried.  Thereafter the periphery of  the wound was painted with benzoin.  We used three 1/2-inch Steri-Strips  to pull the wound together, but there was not 100% apposition of the  edges.  Neurologically, the patient retains protective sensation, and  there is no evidence of ischemia.   DIAGNOSIS:  Persistent stasis ulcer with no evidence of active  infection.   MANAGEMENT PLAN & GOAL:  We will resume the compression wrap with the  externally applied Steri-Strip to promote apposition of the edge.  We  will reevaluate the patient in 1 week.  We have counseled the patient to  call if there is any interim increase in drainage, pain, or fever.  He  seems to understand.  We will see him in 1 week.      Harold A. Tanda Rockers, M.D.  Electronically Signed     HAN/MEDQ  D:  08/09/2006  T:  08/09/2006  Job:  [id number]

## 2010-10-09 NOTE — Assessment & Plan Note (Signed)
[facility]   NAME:  MENDEL, BINSFELD                ACCOUNT NO.:  [id number]   MEDICAL RECORD NO.:  [id number]      DATE OF BIRTH:  [birth year]   PHYSICIAN:  Theresia Majors. Tanda Rockers, M.D.      VISIT DATE:                                   OFFICE VISIT   VITAL SIGNS:  Blood pressure is 120/65, respirations 18, pulse rate 78,  temperature 97.8.   PURPOSE OF TODAY'S VISIT:  Mr. Jandreau is an 77 year old man with a  stasis ulcer on the medial left ankle in the presence of concurrent  administration of methotrexate.  During his last visit, we elected to  paint the wound with Benzoin and apply Steri-Strips to loosely  approximate the skin edges of the ulcer in an effort to counteract the  distracting forces of edema and ambulation.  He returns complaining of  no excessive drainage.  There has been no fever, there has been no  excessive pain or redness.  There has been no malodor.   WOUND EXAM:  Inspection of the wound shows that there has been definite  contraction of the wound.   DIAGNOSIS:  Clinical improvement.   TREATMENT:  The wound was irrigated, painted with Benzoin and fresh  Steri-Strips applied.   TISSUE DEBRIDED:  A full-thickness debridement was performed with a 15  blade with disclosure of healthy-appearing granulation tissue of the  knee.   MANAGEMENT PLAN & GOAL:  We will continue the Steri-Strips, Benzoin and  Profore wrap.  We will reevaluate the patient in one week.      Harold A. Tanda Rockers, M.D.  Electronically Signed     HAN/MEDQ  D:  08/16/2006  T:  08/16/2006  Job:  [id number]

## 2010-10-09 NOTE — Op Note (Signed)
NAME:  [name] [name], [name] [name] NO.:  [id number]   MEDICAL [name] NO.:  [id number]          PATIENT TYPE:  AMB   LOCATION:  DSC                          FACILITY:  [facility]   PHYSICIAN:  Anselm Pancoast. Weatherly, M.D.DATE OF BIRTH:  1921-03-02   DATE OF PROCEDURE:  05/05/2004  DATE OF DISCHARGE:                                 OPERATIVE REPORT   PREOPERATIVE DIAGNOSIS:  Large right direct inguinal hernia.   POSTOPERATIVE DIAGNOSIS:  Large right direct inguinal hernia.   OPERATION:  Right inguinal herniorrhaphy with mesh reinforcement.   Local anesthesia with sedation.   SURGEON:  Anselm Pancoast. Zachery Dakins, M.D.   HISTORY:  [name] [name] is an 77 year old black male who has had an increase  in direct right inguinal hernia for about 15 years, and he is still employed  by [employer].  He desires to have this repaired.  Denies  problems with his prostate or change in bowel habits.  His electrolytes,  etc., were normal preoperatively, and he was given 1 g of Kefzol immediately  preoperatively.   He was positioned on the OR table.  The right groin area had been marked.  It was then clipped with a shaver and then prepped with Betadine solution  and draped in a sterile manner.  The inguinal incision area was infiltrated  with a mixture of 0.5% Xylocaine and 0.25% Marcaine with adrenalin and the  ilioinguinal nerve area was blocked with about 10 mL of the same solution  with a blunted 21-gauge needle.  Sharp dissection down through the skin and  skin and subcutaneous tissue, he was a little bit on the adipose side, and  the external oblique aponeurosis was identified.  The superficial vein was  clamped, ligated with 4-0 Vicryl after being divided, and then the external  oblique aponeurosis.  I placed a few milliliters of the Marcaine in this  area.  The hernia was a fairly large direct hernia.  The cord structures  were always separated from this, and I encompassed the cord  structures with  a Penrose drain.  The hernia sac was invaginated after it had been cleared  circumferentially and then the floor was repaired with a running 2-0 Prolene  starting at the symphysis pubis area.  The shelving edge of the inguinal  ligament to the conjoined tendon area was closed with a running 2-0 Prolene  and the two ends brought back together and tied.  The new created internal  ring would admit the tip of my little finger, and then the piece of Prolene  mesh shaped like a sail, slit laterally, was used to reinforce the floor.  This was sutured to the inguinal ligament with a running 2-0 Prolene and the  two tails laterally sutured around the internal ring to reinforce it.  The  ilioinguinal nerve had been brought with the cord structures and then the  superior flap was sutured down with interrupted 2-0 Prolenes and 2-0 Vicryl  to lay flat and not on tension.  The external oblique was then closed with a  running 2-0 Vicryl and the  Scarpa's fascia  closed with 3-0 Vicryl.  Vicryl 4-0 subcuticular and Benzoin and Steri-  Strips on the skin.  The patient tolerated the procedure nicely and will be  released after a short stay.  He knows that he needs to void this afternoon.  If he is not able to void he will call, as he might need a catheter at his 77.  He denies, however, problems with his prostate.       WJW/MEDQ  D:  05/05/2004  T:  05/05/2004  Job:  [id number]   cc:   Marjory Lies, M.D.  [address]  [place]  [place] [address]  Fax: [phone]

## 2010-10-09 NOTE — Assessment & Plan Note (Signed)
[facility]   NAME:  BERTHEL, BAGNALL                ACCOUNT NO.:  [id number]   MEDICAL RECORD NO.:  [id number]          DATE OF BIRTH:   PHYSICIAN:  Theresia Majors. Tanda Rockers, M.D.      VISIT DATE:                                     OFFICE VISIT   SUBJECTIVE:  Mr. Ergle returns for a left medial ankle ulceration.  During  the interim he has worn a lunar boot.  He has had minimum drainage and no  pain.   OBJECTIVE:  Blood pressure is 128/80, respirations 18, pulse is 64 and he is  afebrile.  There remains chronic changes of stasis with moderately intense  desquamation on the medial aspect of the ankle as well as on the heel. The  ulcer itself is heavily covered by desquamation and some necrotic tissue  which was full-thickness debrided with a wound curette, with hemorrhage  control with direct pressure.  Overall, the wound is 98% healed.  We have  reapplied a lunar boot.   ASSESSMENT:  Improved wound.   PLAN:  We will reevaluate the patient in 10 days in anticipation that his  wound will be healed and he will make a transition to below the knee  compression hose.           ______________________________  Theresia Majors. Tanda Rockers, M.D.     [name]  D:  01/04/2006  T:  01/04/2006  Job:  [id number]

## 2010-10-09 NOTE — Assessment & Plan Note (Signed)
[facility]   NAME:  [name] [name], [name] [name] NO.:  [id number]   MEDICAL RECORD NO.:  [id number]      DATE OF BIRTH:  Mar 03, 1921   PHYSICIAN:  Maxwell Caul, M.D. VISIT DATE:  05/25/2006                                   OFFICE VISIT   LOCATION:  [facility].   Mr. Macphail returns today for follow-up for a very refractory venous  stasis ulcer on the medial left leg.  This had been complicated by  treatment for rheumatoid arthritis including methotrexate and  corticosteroids, however the corticosteroids have recently been stopped.  Most recently I have returned him to a Prisma hydrogel-based regimen  continuing with the Unna wraps.  However, we have not had much success  here.  As I remember things, we have tried to open this wound and vac it  without success, he did not respond to an Apligraf, Regranex or other  topical dressings we have tried to applied.  In the interim the only new  thing he tells me is that he is now on more regular every other day  Lasix for edema which is an increase for him.   PHYSICAL EXAMINATION:  VITAL SIGNS:  On wound examination, his  temperature is 98, pulse 79, respirations 16, blood pressure 120/66.  SKIN:  Once again there is tan colored eschar at the base of this wound  which was removed.  This was done without anesthesia and a #15 blade.  There was no hemostasis necessary.  Once again the base of this wound  looks healthy.  All of the dimensions of this are only 1 x 0.5, there is  a considerable depth to this which has not changed recently.   IMPRESSION:  Venous stasis ulceration, complicated as noted above.  I  have packed this with plain packing gauze and added silver-based wound  gel.  We will Unna wrap this.  I found myself wondering about an  Apligraf again, however this did not make significant difference last  time.  There may have been logistic reasons for the Apligraf to have  difficulty adhering to the surface of this small but reasonably deep  wound.           ______________________________  Maxwell Caul, M.D.     MGR/MEDQ  D:  05/26/2007  T:  05/26/2007  Job:  [id number]

## 2010-10-09 NOTE — Op Note (Signed)
NAME:  [name] [name], [name] [name] NO.:  [id number]   MEDICAL RECORD NO.:  [id number]          PATIENT TYPE:  AMB   LOCATION:  ENDO                         FACILITY:  [facility]   PHYSICIAN:  Petra Kuba, M.D.    DATE OF BIRTH:  06-07-1920   DATE OF PROCEDURE:  08/28/2004  DATE OF DISCHARGE:                                 OPERATIVE REPORT   PROCEDURE:  Colonoscopy with polypectomy.   INDICATIONS:  Cecal polyp last year probably only partially removed, want to  reevaluate. Consent was signed after risks, benefits, methods, and options  were thoroughly discussed in the office on multiple occasions.   MEDICINES USED:  Demerol 60, Versed 6.   DESCRIPTION OF PROCEDURE:  Rectal inspection was pertinent for external  hemorrhoids, small. Digital exam was negative. The video pediatric  adjustable colonoscope was inserted and with abdominal pressure fairly  easily able to be advanced around the colon to the cecum. Other than an  occasional left-sided diverticula, no abnormality was seen on insertion. The  cecum was identified by the appendiceal orifice and ileocecal valve. In the  cecal pole not far from the appendix, a residual polyp was seen. It seemed  to be more raised than last year. It seemed like a similar polyp because  there was some white scarring at the edge we went ahead and carefully snared  it and on a setting of 15/15, electrocautery was carefully applied. The  polyp was removed and suctioned through the scope and collected in the trap.  We did take one hot biopsy of the edge of the polypoid section just to make  sure no residual polypoid tissue was remaining. This was put in a separate  container. There was a nice white coagulum without any obvious residual  polypoid tissue, the scope was slowly withdrawn. The prep was adequate.  There was minimal liquid stool that required washing and suctioning. On slow  withdrawal back to the rectum other than some occasional  left-sided  diverticula, mo other polypoid lesions were seen. Anorectal pull-through and  retroflexion confirmed some small hemorrhoids. The scope was straightened  readvanced a short ways up the left side of the colon, air was suctioned,  scope removed. The patient tolerated the procedure well. There was no  obvious immediate complication.   ENDOSCOPIC DIAGNOSIS:  1.  Internal-external hemorrhoids.  2.  Left-sided diverticula.  3.  1 cm cecal residual polyps status post snare and hot biopsy of the edge      on a of setting of 15/15.  4.  Otherwise within normal limits to the cecum.   PLAN:  Await pathology, repeat screening based on that plus other medical  problems based on how he is doing but probably relook in three years unless  pathology is worrisome. Will return care to Dr. Doristine Counter and happy to see  back in the meantime p.r.n.      MEM/MEDQ  D:  08/28/2004  T:  08/28/2004  Job:  [id number]   cc:   Marjory Lies, M.D.  [address]  [place]  [place] [address]  Fax:  [phone] 

## 2010-10-09 NOTE — Assessment & Plan Note (Signed)
[facility]                             PULMONARY OFFICE NOTE   NAME:[name] [name], ASHVIN ADELSON                       MRN:          [id number]  DATE:07/11/2006                            DOB:          [birth year]    PROBLEM LIST:  1. Dyspnea/chronic obstructive pulmonary disease.  2. Gout.  3. Rheumatoid arthritis.  4. Peripheral venous stasis disease with skin ulcer.   HISTORY:  He had an elevated D-dimer of 4.53.  We caught up with [facility]  [facility] where he was getting his legs  wrapped.  They were able to arrange Dopplers of his leg veins while his  legs were unwrapped.  This returned negative for evidence of venous  thrombosis.  Chest CT was suboptimal because some of the dye  extravasated, but there were no large pulmonary emboli.  Some emphysema  was seen.  The elevated D-dimer is probably attributable to his leg  ulcer.  He felt, today, that his breathing has been fairly stable with  some change from day to day, blamed mostly on weather changes.  He  continues methotrexate for rheumatoid arthritis.  There is dyspnea  walking any distance.  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  He has not had chest pain or  palpitations, productive cough, or acute events.   MEDICATIONS:  1. Methotrexate 5 tabs b.i.d. every 7 days.  2. Allopurinol 300 mg.  3. Lisinopril 20 mg times 1/2.  4. Folic acid.  5. Prednisone 5 mg.  6. Aspirin 81 mg.  7. Calcium with vitamin D.  8. Rescue use of a Combivent inhaler.   Drug intolerant to CONTRAST DYE, however, he tolerated the CT scan  without problems.   Pulmonary function testing on February 6th showed mild obstructive  airways disease, mainly in the small airways, with insignificant  response to bronchodilator.  Measured lung volumes were normal.  Diffusion was mildly reduced at 69%.  On the 6-minute walk test, he went  357 meters, and  experienced an oxygen saturation drop from 94% at the  beginning to 87% by the end of the test.  Two minutes of rest rebounded  his saturation to 95%.  This is felt to be a significant desaturation,  and consistent with cardiopulmonary insufficiency at this exercise  level.  Blood work, otherwise, as of January 28th, had shown a normal  white blood count of 7,400 with hemoglobin of 11, platelet count of  285,000, random glucose of 102, albumin of 3.3, BUN 21, creatinine 1.3,  b-type natriuretic peptide was 15.   OBJECTIVE:  Weight 207 pounds, BP 118/74, pulse regular 90, room air  saturation 95%.  He seemed quite comfortable sitting quietly at rest.  Heart sounds were regular without murmur or gallop.  His chest was distinctly clear.  He was wearing elastic hose.  There was no cyanosis.   IMPRESSION:  Mild chronic obstructive pulmonary disease based on  pulmonary function tests with exertional dyspnea, peripheral venous  insufficiency, no clear evidence on limited  CT scan to suggest pulmonary  embolization.   PLAN:  He is going to try adding Spiriva once daily.  I have encouraged  him to walk within his comfort limits, and schedule return 1 month,  earlier p.r.n.     Clinton D. Maple Hudson, MD, Tonny Bollman, FACP  Electronically Signed    CDY/MedQ  DD: 07/16/2006  DT: 07/16/2006  Job #: [id number]   cc:   Marjory Lies, M.D.  Maxwell Caul, M.D.

## 2010-10-09 NOTE — Assessment & Plan Note (Signed)
[facility]   NAME:  [name] [name], [name] [name] NO.:  [id number]   MEDICAL RECORD NO.:  [id number]      DATE OF BIRTH:  07-20-1920   PHYSICIAN:  Maxwell Caul, M.D.      VISIT DATE:                                   OFFICE VISIT   PURPOSE OF TODAY'S VISIT:  Review of venous stasis wound on the left  medial ankle.  Patient returned earlier today complaining of pain around  the wound.  He is concerned about infection.  He is already on  doxycycline.   WOUND EXAM:  The patient is afebrile.  The wound is actually by review  and discussion with the nurses almost looks completely unchanged from  last time.  I did do a full-thickness debridement of a mild amount of  tan eschar from the base of the wound, underneath there was decent  granulation tissue, but no evidence of epithelialization.  I did not  feel there was any evidence of surrounding infection.  I did not feel  that any change in antibiotics was necessary.   IMPRESSION:  Venous stasis ulcer.   We have continued with the current treatment as outlined previously by  Dr Tanda Rockers.  Steri-Strip approximation with Felt pad, compression and  Ace wrap.  He is to continue on the doxycycline and keep his followup  appointment for next week.  As mentioned, we did debride the base of  this wound with EMLA for anesthesia, no hemostasis was necessary, a #15  blade was used.           ______________________________  Maxwell Caul, M.D.     MGR/MEDQ  D:  09/23/2006  T:  09/24/2006  Job:  [id number]

## 2010-10-09 NOTE — Assessment & Plan Note (Signed)
[facility]   NAME:  [name] [name], [name] [name] NO.:  [id number]   MEDICAL RECORD NO.:  [id number]            DATE OF BIRTH:   PHYSICIAN:  Maxwell Caul, M.D. VISIT DATE:  10/28/2006                                   OFFICE VISIT   PURPOSE OF TODAY'S VISIT:  Review of refractory ulcer on his medial left  ankle.  After debriding him last week, I changed him to Accuzyme and a  Profore wrap.  He states the Profore wrap has slipped down; however, he  has no other complaints.  I have reviewed his medical record.  He has  been treated extensively with matrix and silver matrix dressings under  compression wraps.  He also had a prolonged Regranex treatment protocol  in the later part of the fall of last year.  This did not result in any  effect, and he already complained about the cost.  His non-healing has  been complicated by the use of steroids and methotrexate.  He does not  report any pain or fever.   WOUND EXAM:  The wound looks the same as usual.  It is a tan color  eschar which is easily debrided with a #15 blade.  No anesthesia is  required.  Once again, under the base of this, there is decent-looking  granulation tissue.  The depth of the wound has remained changed.  The  periwound that looks like stasis, there is 1 to 2+ edema.   WOUND CARE PLAN AND FOLLOWUP:  I am going to continue with the Accuzyme,  this time change to an Unna wrap.  This is indeed becoming a difficult  area; however, with the eschar that develops, I do not think we will  have any chance to heal this until we control this.           ______________________________  Maxwell Caul, M.D.     MGR/MEDQ  D:  10/28/2006  T:  10/28/2006  Job:  [id number]

## 2010-10-09 NOTE — Assessment & Plan Note (Signed)
[facility]   NAME:  [name] [name], [name] [name]                ACCOUNT NO.:  [id number]   MEDICAL RECORD NO.:  [id number]           DATE OF BIRTH:   PHYSICIAN:  Jonelle Sports. Sevier, M.D.  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   With this episode of his disease he has been under our treatment since March  13 and has made progress with aggressive care.  He is now very near healing.   Patient reports that since his last visit here there has been no pain, no  awareness of any increased drainage, no fever or systemic symptoms, and no  change in his medications.  He feels he is doing well.   PHYSICAL EXAMINATION:  VITAL SIGNS:  Blood pressure 136/78, pulse 64 and  slightly irregular with occasional PCs, respirations 16, temperature 98.  EXTREMITIES:  The wound itself on the medial aspect of the left ankle  measures 1.2 x 0.3 x 0.2 cm which is an improvement in every dimension from  before.  There is no sinus tract, minimal serous drainage, no odor, no  slough or eschar.  The wound margins appear to be advancing nicely and it is  estimated this wound is greater than 75% healed.   DISPOSITION:  Management today includes debridement of the margins of the  wound for removal of some crust in hopes of stimulating further  epithelialization.   The wound is then treated with the re-application of Prisma which is in turn  covered by Mepitel and Bactroban.  A SofSorb pad is placed over the wound.  The extremity then is wrapped on a Profore wrap from the __________ to the  knees in order to counter the venous hypertension and tendency toward edema.   Follow-up visit will be in one week.           ______________________________  Jonelle Sports. Cheryll Cockayne, M.D.     RES/MEDQ  D:  10/14/2005  T:  10/14/2005  Job:  [id number]

## 2010-10-11 LAB — STOOL CULTURE

## 2010-10-11 LAB — CULTURE, BLOOD (ROUTINE X 2)
Culture  Setup Time: 201205141017
Culture: NO GROWTH

## 2010-10-13 LAB — CULTURE, BLOOD (ROUTINE X 2)
Culture  Setup Time: 201205160331
Culture: NO GROWTH

## 2010-10-17 NOTE — Discharge Summary (Signed)
NAME:  [name] [name], [name] [name] NO.:  [id number]  MEDICAL RECORD NO.:  [id number]           PATIENT TYPE:  I  LOCATION:  5505                         FACILITY:  [facility]  PHYSICIAN:  Lonia Blood, M.D.       DATE OF BIRTH:  [birth year]  DATE OF ADMISSION:  10/04/2010 DATE OF DISCHARGE:  10/08/2010                              DISCHARGE SUMMARY   PRIMARY CARE PHYSICIAN:  Marjory Lies, MD  DISCHARGE DIAGNOSES: 1. Fever of unknown etiology, resolved. 2. Probable viral gastroenteritis. 3. Rheumatoid arthritis. 4. History pulmonary emboli and chronic Coumadin. 5. Hypertension. 6. Chronic kidney disease, stage III. 7. Benign prostatic hyperplasia.  DISCHARGE MEDICATIONS: 1. Berry by mouth two tablets daily. 2. Ciprofloxacin 500 by mouth twice a day for three more days. 3. Combivent inhaled one puff twice a day as needed. 4. Furosemide 20 mg daily. 5. Lisinopril 10 mg daily. 6. Methotrexate 10 mg weekly. 7. Mucinex over-the-counter one tablet twice a day as needed. 8. Omeprazole 40 mg daily. 9. Osteo Bi-Flex. 10.Glucosamine and chondroitin two tablets daily. 11.Prednisone 20 mg daily. 12.Tamsulosin 0.4 mg daily. 13.Tramadol 50 mg twice a day as needed. 14.Coumadin 2.5 mg daily.  CONDITION ON DISCHARGE:  [name] [name] was discharged in good condition. He was afebrile 24 hours prior to his discharge.  Vital signs at the time of discharge; temperature 98.3, pulse 65, respirations 18, saturation 99% on room air, blood pressure 128/81.  The patient was told to follow up with his primary care physician, Dr. Marjory Lies after discharge.  PROCEDURE DURING THIS ADMISSION: 1. The patient underwent a portable chest x-ray on Oct 05, 2010 which     was negative for any pulmonary infiltrates. 2. Head CT on Oct 05, 2010, which was negative for acute bleed. 3. Blood cultures done during urine culture which were negative for     growth. 4. Stool for Clostridium difficile which was  negative for C. diff     toxin assay by PCR.  CONSULTATION:  No consultations obtained.  HISTORY AND PHYSICAL:  Refer to the dictated H and P done by Dr. Toniann Fail.  HOSPITAL COURSE:  [name] [name] is an 77 year old gentleman with history of rheumatoid arthritis, on methotrexate and prednisone, presented to the emergency room with complaints of feeling weak and having a high fever.  He was admitted to Step-Down Unit and started on empiric antibiotics using vancomycin and Zosyn.  Blood cultures were sent. Urine culture was sent and the concern was raised about possible cellulitis.  The patient was observed in the hospital attentively and became apparent that he was not having pneumonia, not having pyelonephritis and not having bacteremia.  Also examination of his wound showed a chronic venous stasis wound without signs of infection.  He remained hemodynamically stable and the only other symptom that he developed on Oct 07, 2010 was diarrhea.  Stool was sent for Clostridium difficile studies as well as for routine cultures and the patient's antibiotics were switched to ciprofloxacin and Flagyl.  By Oct 08, 2010, the fever was not present anymore.  The patient was able to tolerate the regular diet  and the diarrhea has stopped.  We think that the patient may had a viral gastroenteritis that responded to symptomatic treatment that he was provided with.  He was feeling well and he was hemodynamically stable and we felt like he does not require any further hospitalization.  He was discharged home with close followup with his primary care physician.     Lonia Blood, M.D.     SL/MEDQ  D:  10/08/2010  T:  10/09/2010  Job:  [id number]  cc:   Marjory Lies, M.D.  Electronically Signed by Lonia Blood M.D. on 10/17/2010 08:17:29 AM

## 2011-01-07 ENCOUNTER — Encounter (HOSPITAL_BASED_OUTPATIENT_CLINIC_OR_DEPARTMENT_OTHER): Payer: PRIVATE HEALTH INSURANCE | Attending: Internal Medicine

## 2011-01-07 DIAGNOSIS — Z86718 Personal history of other venous thrombosis and embolism: Secondary | ICD-10-CM | POA: Insufficient documentation

## 2011-01-07 DIAGNOSIS — I872 Venous insufficiency (chronic) (peripheral): Secondary | ICD-10-CM | POA: Insufficient documentation

## 2011-01-07 DIAGNOSIS — L97509 Non-pressure chronic ulcer of other part of unspecified foot with unspecified severity: Secondary | ICD-10-CM | POA: Insufficient documentation

## 2011-01-07 DIAGNOSIS — M069 Rheumatoid arthritis, unspecified: Secondary | ICD-10-CM | POA: Insufficient documentation

## 2011-01-07 DIAGNOSIS — Z79899 Other long term (current) drug therapy: Secondary | ICD-10-CM | POA: Insufficient documentation

## 2011-01-07 DIAGNOSIS — Z7901 Long term (current) use of anticoagulants: Secondary | ICD-10-CM | POA: Insufficient documentation

## 2011-01-07 DIAGNOSIS — IMO0002 Reserved for concepts with insufficient information to code with codable children: Secondary | ICD-10-CM | POA: Insufficient documentation

## 2011-01-07 DIAGNOSIS — R609 Edema, unspecified: Secondary | ICD-10-CM | POA: Insufficient documentation

## 2011-01-29 ENCOUNTER — Encounter (HOSPITAL_BASED_OUTPATIENT_CLINIC_OR_DEPARTMENT_OTHER): Payer: PRIVATE HEALTH INSURANCE | Attending: General Surgery

## 2011-01-29 DIAGNOSIS — R609 Edema, unspecified: Secondary | ICD-10-CM | POA: Insufficient documentation

## 2011-01-29 DIAGNOSIS — L97509 Non-pressure chronic ulcer of other part of unspecified foot with unspecified severity: Secondary | ICD-10-CM | POA: Insufficient documentation

## 2011-01-29 DIAGNOSIS — I872 Venous insufficiency (chronic) (peripheral): Secondary | ICD-10-CM | POA: Insufficient documentation

## 2011-01-29 DIAGNOSIS — M069 Rheumatoid arthritis, unspecified: Secondary | ICD-10-CM | POA: Insufficient documentation

## 2011-01-29 DIAGNOSIS — Z86718 Personal history of other venous thrombosis and embolism: Secondary | ICD-10-CM | POA: Insufficient documentation

## 2011-01-29 DIAGNOSIS — IMO0002 Reserved for concepts with insufficient information to code with codable children: Secondary | ICD-10-CM | POA: Insufficient documentation

## 2011-01-29 DIAGNOSIS — Z79899 Other long term (current) drug therapy: Secondary | ICD-10-CM | POA: Insufficient documentation

## 2011-01-29 DIAGNOSIS — Z7901 Long term (current) use of anticoagulants: Secondary | ICD-10-CM | POA: Insufficient documentation

## 2011-02-24 LAB — BASIC METABOLIC PANEL
BUN: 18
Creatinine, Ser: 1.38
GFR calc non Af Amer: 49 — ABNORMAL LOW
Glucose, Bld: 96
Potassium: 4.4

## 2011-02-24 LAB — POCT HEMOGLOBIN-HEMACUE: Hemoglobin: 10.8 — ABNORMAL LOW

## 2011-02-26 ENCOUNTER — Encounter (HOSPITAL_BASED_OUTPATIENT_CLINIC_OR_DEPARTMENT_OTHER): Payer: Medicare Other | Attending: General Surgery

## 2011-02-26 DIAGNOSIS — I872 Venous insufficiency (chronic) (peripheral): Secondary | ICD-10-CM | POA: Insufficient documentation

## 2011-02-26 DIAGNOSIS — L97309 Non-pressure chronic ulcer of unspecified ankle with unspecified severity: Secondary | ICD-10-CM | POA: Insufficient documentation

## 2011-02-26 DIAGNOSIS — Z7901 Long term (current) use of anticoagulants: Secondary | ICD-10-CM | POA: Insufficient documentation

## 2011-02-26 DIAGNOSIS — Z79899 Other long term (current) drug therapy: Secondary | ICD-10-CM | POA: Insufficient documentation

## 2011-02-26 DIAGNOSIS — J449 Chronic obstructive pulmonary disease, unspecified: Secondary | ICD-10-CM | POA: Insufficient documentation

## 2011-02-26 DIAGNOSIS — Z86718 Personal history of other venous thrombosis and embolism: Secondary | ICD-10-CM | POA: Insufficient documentation

## 2011-02-26 DIAGNOSIS — J4489 Other specified chronic obstructive pulmonary disease: Secondary | ICD-10-CM | POA: Insufficient documentation

## 2011-02-26 DIAGNOSIS — I10 Essential (primary) hypertension: Secondary | ICD-10-CM | POA: Insufficient documentation

## 2011-03-26 ENCOUNTER — Encounter (HOSPITAL_BASED_OUTPATIENT_CLINIC_OR_DEPARTMENT_OTHER): Payer: PRIVATE HEALTH INSURANCE

## 2011-04-12 NOTE — H&P (Signed)
  NAME:  [name] [name], [name] [name] NO.:  [id number]  MEDICAL RECORD NO.:  [id number]  LOCATION:  FOOT                         FACILITY:  [facility]  PHYSICIAN:  Ardath Sax, M.D.     DATE OF BIRTH:  1920/06/02  DATE OF ADMISSION:  01/07/2011 DATE OF DISCHARGE:                             HISTORY & PHYSICAL   He is an 77 year old man.  He has got venous stasis ulcer on the left side of his foot, it is quite small but he said it has been draining a huge amount of clear fluid.  His doctor sent him for an x-ray which showed no osteoarthritis.  He has a history of rheumatoid arthritis and he is on big doses of prednisone and methotrexate.  He takes 20 of prednisone a day.  He does have some peripheral edema.  He has got excellent pulses with no problems with vascularity.  He has also, in the past, had a pulmonary embolus and is on Coumadin.  His doctor follows him closely, sees him once a month and he also sees a rheumatologist who is the one had prescribed the prednisone and methotrexate.  He is going to see this doctor in a couple of weeks and speak with him about eventually coming off both of these drugs as I really think the especially the prednisone is one of the reasons why he has edema of his lower extremities and swelling and drainage out of a lateral side of his foot where he does have a small ulcer, probably 3-4 mm in diameter, but I cannot get any fluid out of it today.  His doctor had put him on doxycycline and the patient feels that this helped clear that is also why they ordered the x-ray to rule out osteo which was negative, so he will come back in a week.  We put a Unna boot on him and put some zinc oxide and silver cell in the ulcer, and he will come back in a week.     Ardath Sax, M.D.     PP/MEDQ  D:  01/07/2011  T:  01/08/2011  Job:  [id number]  Electronically Signed by Ardath Sax  on 04/12/2011 02:43:49 PM

## 2011-04-30 ENCOUNTER — Encounter (HOSPITAL_COMMUNITY): Payer: Self-pay | Admitting: *Deleted

## 2011-04-30 ENCOUNTER — Encounter (HOSPITAL_BASED_OUTPATIENT_CLINIC_OR_DEPARTMENT_OTHER): Payer: Non-veteran care | Attending: General Surgery

## 2011-04-30 ENCOUNTER — Inpatient Hospital Stay (HOSPITAL_COMMUNITY)
Admission: EM | Admit: 2011-04-30 | Discharge: 2011-05-05 | DRG: 504 | Disposition: A | Discharge status: Skilled Nursing Facility | Payer: Medicare Other | Source: Ambulatory Visit | Attending: Family Medicine | Admitting: Family Medicine

## 2011-04-30 ENCOUNTER — Emergency Department (HOSPITAL_COMMUNITY): Payer: Medicare Other

## 2011-04-30 DIAGNOSIS — M79609 Pain in unspecified limb: Secondary | ICD-10-CM

## 2011-04-30 DIAGNOSIS — L97309 Non-pressure chronic ulcer of unspecified ankle with unspecified severity: Secondary | ICD-10-CM | POA: Insufficient documentation

## 2011-04-30 DIAGNOSIS — I739 Peripheral vascular disease, unspecified: Secondary | ICD-10-CM | POA: Insufficient documentation

## 2011-04-30 DIAGNOSIS — M869 Osteomyelitis, unspecified: Secondary | ICD-10-CM | POA: Insufficient documentation

## 2011-04-30 DIAGNOSIS — L02619 Cutaneous abscess of unspecified foot: Secondary | ICD-10-CM | POA: Diagnosis present

## 2011-04-30 DIAGNOSIS — Z79899 Other long term (current) drug therapy: Secondary | ICD-10-CM | POA: Insufficient documentation

## 2011-04-30 DIAGNOSIS — M109 Gout, unspecified: Secondary | ICD-10-CM | POA: Diagnosis present

## 2011-04-30 DIAGNOSIS — L03039 Cellulitis of unspecified toe: Secondary | ICD-10-CM | POA: Insufficient documentation

## 2011-04-30 DIAGNOSIS — M86179 Other acute osteomyelitis, unspecified ankle and foot: Principal | ICD-10-CM | POA: Diagnosis present

## 2011-04-30 DIAGNOSIS — L02612 Cutaneous abscess of left foot: Secondary | ICD-10-CM

## 2011-04-30 DIAGNOSIS — L97509 Non-pressure chronic ulcer of other part of unspecified foot with unspecified severity: Secondary | ICD-10-CM | POA: Insufficient documentation

## 2011-04-30 DIAGNOSIS — I10 Essential (primary) hypertension: Secondary | ICD-10-CM

## 2011-04-30 DIAGNOSIS — M069 Rheumatoid arthritis, unspecified: Secondary | ICD-10-CM | POA: Insufficient documentation

## 2011-04-30 DIAGNOSIS — L03116 Cellulitis of left lower limb: Secondary | ICD-10-CM

## 2011-04-30 DIAGNOSIS — I825Z9 Chronic embolism and thrombosis of unspecified deep veins of unspecified distal lower extremity: Secondary | ICD-10-CM | POA: Diagnosis present

## 2011-04-30 DIAGNOSIS — M7989 Other specified soft tissue disorders: Secondary | ICD-10-CM

## 2011-04-30 DIAGNOSIS — R791 Abnormal coagulation profile: Secondary | ICD-10-CM | POA: Diagnosis present

## 2011-04-30 DIAGNOSIS — M86172 Other acute osteomyelitis, left ankle and foot: Secondary | ICD-10-CM | POA: Diagnosis present

## 2011-04-30 DIAGNOSIS — I872 Venous insufficiency (chronic) (peripheral): Secondary | ICD-10-CM | POA: Insufficient documentation

## 2011-04-30 DIAGNOSIS — N183 Chronic kidney disease, stage 3 unspecified: Secondary | ICD-10-CM | POA: Diagnosis present

## 2011-04-30 DIAGNOSIS — Z7901 Long term (current) use of anticoagulants: Secondary | ICD-10-CM | POA: Insufficient documentation

## 2011-04-30 DIAGNOSIS — L03119 Cellulitis of unspecified part of limb: Secondary | ICD-10-CM | POA: Diagnosis present

## 2011-04-30 DIAGNOSIS — D649 Anemia, unspecified: Secondary | ICD-10-CM

## 2011-04-30 DIAGNOSIS — N039 Chronic nephritic syndrome with unspecified morphologic changes: Secondary | ICD-10-CM | POA: Diagnosis present

## 2011-04-30 DIAGNOSIS — Z86718 Personal history of other venous thrombosis and embolism: Secondary | ICD-10-CM | POA: Insufficient documentation

## 2011-04-30 DIAGNOSIS — D631 Anemia in chronic kidney disease: Secondary | ICD-10-CM | POA: Diagnosis present

## 2011-04-30 DIAGNOSIS — N289 Disorder of kidney and ureter, unspecified: Secondary | ICD-10-CM | POA: Diagnosis not present

## 2011-04-30 DIAGNOSIS — I129 Hypertensive chronic kidney disease with stage 1 through stage 4 chronic kidney disease, or unspecified chronic kidney disease: Secondary | ICD-10-CM | POA: Diagnosis present

## 2011-04-30 DIAGNOSIS — I8290 Acute embolism and thrombosis of unspecified vein: Secondary | ICD-10-CM | POA: Insufficient documentation

## 2011-04-30 HISTORY — DX: Chronic kidney disease, stage 3 unspecified: N18.30

## 2011-04-30 HISTORY — DX: Chronic kidney disease, stage 3 (moderate): N18.3

## 2011-04-30 HISTORY — DX: Peripheral vascular disease, unspecified: I73.9

## 2011-04-30 HISTORY — DX: Anemia, unspecified: D64.9

## 2011-04-30 HISTORY — DX: Rheumatoid arthritis, unspecified: M06.9

## 2011-04-30 HISTORY — DX: Chronic kidney disease, unspecified: N18.9

## 2011-04-30 LAB — COMPREHENSIVE METABOLIC PANEL
ALT: 18 U/L (ref 0–53)
Albumin: 2.8 g/dL — ABNORMAL LOW (ref 3.5–5.2)
BUN: 26 mg/dL — ABNORMAL HIGH (ref 6–23)
Calcium: 8.8 mg/dL (ref 8.4–10.5)
GFR calc Af Amer: 41 mL/min — ABNORMAL LOW (ref >90)
Glucose, Bld: 101 mg/dL — ABNORMAL HIGH (ref 70–99)
Sodium: 135 mEq/L (ref 135–145)
Total Protein: 7.5 g/dL (ref 6.0–8.3)

## 2011-04-30 LAB — CBC
Hemoglobin: 9.9 g/dL — ABNORMAL LOW (ref 13.0–17.0)
RBC: 3.29 MIL/uL — ABNORMAL LOW (ref 4.22–5.81)

## 2011-04-30 LAB — DIFFERENTIAL
Lymphs Abs: 0.8 10*3/uL (ref 0.7–4.0)
Monocytes Relative: 8 % (ref 3–12)
Neutro Abs: 4.7 10*3/uL (ref 1.7–7.7)
Neutrophils Relative %: 77 % (ref 43–77)

## 2011-04-30 LAB — SEDIMENTATION RATE: Sed Rate: 109 mm/hr — ABNORMAL HIGH (ref 0–16)

## 2011-04-30 LAB — PROTIME-INR: INR: 1.72 — ABNORMAL HIGH (ref 0.00–1.49)

## 2011-04-30 MED ORDER — DUTASTERIDE 0.5 MG PO CAPS
0.5000 mg | ORAL_CAPSULE | Freq: Every day | ORAL | Status: DC
  Administered 2011-05-01 – 2011-05-04 (×5): 0.5 mg via ORAL
  Filled 2011-04-30 (×7): qty 1

## 2011-04-30 MED ORDER — ALBUTEROL SULFATE (5 MG/ML) 0.5% IN NEBU: 2.5000 mg | INHALATION_SOLUTION | RESPIRATORY_TRACT | Status: DC | PRN

## 2011-04-30 MED ORDER — VANCOMYCIN HCL IN DEXTROSE 1-5 GM/200ML-% IV SOLN
1000.0000 mg | Freq: Once | INTRAVENOUS | Status: AC
  Administered 2011-04-30: 1000 mg via INTRAVENOUS
  Filled 2011-04-30: qty 200

## 2011-04-30 MED ORDER — MORPHINE SULFATE 2 MG/ML IJ SOLN
1.0000 mg | INTRAMUSCULAR | Status: DC | PRN
  Administered 2011-05-02 – 2011-05-03 (×2): 1 mg via INTRAVENOUS
  Filled 2011-04-30 (×3): qty 1

## 2011-04-30 MED ORDER — ENOXAPARIN SODIUM 40 MG/0.4ML ~~LOC~~ SOLN
40.0000 mg | SUBCUTANEOUS | Status: DC
  Administered 2011-05-01 – 2011-05-03 (×3): 40 mg via SUBCUTANEOUS
  Filled 2011-04-30 (×4): qty 0.4

## 2011-04-30 MED ORDER — LISINOPRIL 5 MG PO TABS
5.0000 mg | ORAL_TABLET | Freq: Every day | ORAL | Status: DC
  Administered 2011-04-30 – 2011-05-04 (×5): 5 mg via ORAL
  Filled 2011-04-30 (×5): qty 1

## 2011-04-30 MED ORDER — ACETAMINOPHEN 650 MG RE SUPP: 650.0000 mg | Freq: Four times a day (QID) | RECTAL | Status: DC | PRN

## 2011-04-30 MED ORDER — ACETAMINOPHEN 325 MG PO TABS: 650.0000 mg | ORAL_TABLET | Freq: Four times a day (QID) | ORAL | Status: DC | PRN

## 2011-04-30 MED ORDER — OXYCODONE HCL 5 MG PO TABS
5.0000 mg | ORAL_TABLET | ORAL | Status: DC | PRN
  Administered 2011-05-03 – 2011-05-05 (×4): 5 mg via ORAL
  Filled 2011-04-30 (×4): qty 1

## 2011-04-30 MED ORDER — PIPERACILLIN-TAZOBACTAM 3.375 G IVPB
3.3750 g | Freq: Three times a day (TID) | INTRAVENOUS | Status: DC
  Administered 2011-04-30 – 2011-05-01 (×2): 3.375 g via INTRAVENOUS
  Administered 2011-05-01: 6.75 g via INTRAVENOUS
  Administered 2011-05-01 – 2011-05-04 (×7): 3.375 g via INTRAVENOUS
  Filled 2011-04-30 (×14): qty 50

## 2011-04-30 MED ORDER — ENOXAPARIN SODIUM 40 MG/0.4ML ~~LOC~~ SOLN
40.0000 mg | SUBCUTANEOUS | Status: AC
  Filled 2011-04-30: qty 0.4

## 2011-04-30 MED ORDER — VANCOMYCIN HCL 1000 MG IV SOLR
1250.0000 mg | INTRAVENOUS | Status: DC
  Administered 2011-05-01 – 2011-05-03 (×3): 1250 mg via INTRAVENOUS
  Filled 2011-04-30 (×4): qty 1250

## 2011-04-30 MED ORDER — WARFARIN SODIUM 5 MG PO TABS
5.0000 mg | ORAL_TABLET | ORAL | Status: DC
  Filled 2011-04-30: qty 1

## 2011-04-30 MED ORDER — FUROSEMIDE 20 MG PO TABS
20.0000 mg | ORAL_TABLET | Freq: Every day | ORAL | Status: DC
  Administered 2011-04-30 – 2011-05-04 (×5): 20 mg via ORAL
  Filled 2011-04-30 (×5): qty 1

## 2011-04-30 MED ORDER — CALCIUM CARBONATE-VITAMIN D 500-200 MG-UNIT PO TABS
1.0000 | ORAL_TABLET | Freq: Every day | ORAL | Status: DC
  Administered 2011-04-30 – 2011-05-05 (×6): 1 via ORAL
  Filled 2011-04-30 (×6): qty 1

## 2011-04-30 MED ORDER — HYDROMORPHONE HCL PF 1 MG/ML IJ SOLN: 0.5000 mg | INTRAMUSCULAR | Status: DC | PRN

## 2011-04-30 MED ORDER — ONDANSETRON HCL 4 MG/2ML IJ SOLN: 4.0000 mg | Freq: Three times a day (TID) | INTRAMUSCULAR | Status: DC | PRN

## 2011-04-30 MED ORDER — PIPERACILLIN-TAZOBACTAM 3.375 G IVPB
3.3750 g | Freq: Once | INTRAVENOUS | Status: AC
  Administered 2011-04-30: 3.375 g via INTRAVENOUS
  Filled 2011-04-30: qty 50

## 2011-04-30 MED ORDER — ALLOPURINOL 150 MG HALF TABLET
150.0000 mg | ORAL_TABLET | Freq: Every day | ORAL | Status: DC
  Administered 2011-04-30 – 2011-05-05 (×6): 150 mg via ORAL
  Filled 2011-04-30 (×6): qty 1

## 2011-04-30 NOTE — ED Provider Notes (Signed)
History     CSN: [id number] Arrival date & time: 04/30/2011 11:20 AM   None     Chief Complaint  Patient presents with  . Leg Pain  . Wound Infection  . Leg Swelling    (Consider location/radiation/quality/duration/timing/severity/associated sxs/prior treatment) HPI  [name] [name] is a 77 y/o M with history of RA, PE, HTN, CKD stage III, and history of multiple soft tissue infections of distal LLE who was sent to the ED today by his wound care physician Dr. Jimmey Ralph with a severe cellulitis of distal LLE and abscess of 1st and 2nd toes.  Dr. Lavone Neri note indicates that he debrided the abscess of 1st and 2nd toes this morning.  [name] [name] says this new infection started 7 days ago.  He has taken Augmentin since that time, and he has gotten Rocephin shots by his PCP once per day on the three days prior to admission.  He has had considerable LLE pain and tenderness distally.  No definite fevers or chills.  No CP, SOB, nausea, vomiting.    The patient has had rheumatoid arthritis for at least 5-6 years and takes methotrexate.  He also takes prednisone sometimes, last taking it 5-7 days ago.  He takes it when he feels that his joints are stiff.  His rheumatologist is Dr. Kelli Churn, who he last saw 2-3 months ago per pt.    Wound care doc Dr. Jimmey Ralph has requested the patient be admitted to the hospital for IV abx and a surgical consult.   Past Medical History  Diagnosis Date  . Hypertension   RA PE HTN CKD Stage III BPH  Past Surgical History  Procedure Date  . Hernia repair     No family history on file.  History  Substance Use Topics  . Smoking status: Former Games developer  . Smokeless tobacco: Not on file  . Alcohol Use: No   Quit smoking 30 yrs ago. Last drank alcohol in 1946 No drug use besides medications prescribed for him He lives with his wife, who has Alzheimer's.  He is her primary caregiver.  Some of his children are going to help out while he is in the hospital.  At  baseline, before this past week, he walked around with a cane at baseline.  Now he has been using crutches.     Review of Systems Decreased urination for a while now, but eating and drinking his normal amount.  He says he has gained 3-4 lbs since Thanksgiving. No CP, SOB, nausea, vomiting.   No constipation or diarrhea.  He says the antibiotics this past week have made him have some loose stools, but no frank diarrhea.    Allergies  Iohexol  Home Medications   Current Outpatient Rx  Name Route Sig Dispense Refill  . ALLOPURINOL 300 MG PO TABS Oral Take 150 mg by mouth daily.      . AMOXICILLIN-POT CLAVULANATE 875-125 MG PO TABS Oral Take 1 tablet by mouth 2 (two) times daily.      Marland Kitchen CALCIUM CARBONATE-VITAMIN D 500-200 MG-UNIT PO TABS Oral Take 1 tablet by mouth daily.      . DUTASTERIDE 0.5 MG PO CAPS Oral Take 0.5 mg by mouth at bedtime.      . FUROSEMIDE 20 MG PO TABS Oral Take 20 mg by mouth daily.      Marland Kitchen GLUCOSAMINE 500 MG PO CAPS Oral Take 1 capsule by mouth daily.      Marland Kitchen LISINOPRIL 10 MG PO TABS Oral Take  5 mg by mouth daily.      Marland Kitchen METHOTREXATE 2.5 MG PO TABS Oral Take 7.5 mg by mouth 2 (two) times a week. Caution:Chemotherapy. Protect from light.     . WARFARIN SODIUM 5 MG PO TABS Oral Take 5 mg by mouth daily. Takes 2.5 mg alternating with 5 mg tablets.     Rocephin injections daily yesterday, day before, and day before that by PCP  BP 121/64  Pulse 108  Temp(Src) 98.8 F (37.1 C) (Oral)  Resp 18  Ht 5\' 9"  (1.753 m)  Wt 204 lb (92.534 kg)  BMI 30.13 kg/m2  SpO2 98%  Physical Exam  General: alert, well-developed, and cooperative to examination.  Head: normocephalic and atraumatic.  Eyes: vision grossly intact, pupils equal, pupils round, pupils reactive to light, no injection and anicteric.  Mouth: pharynx pink and moist, no erythema, and no exudates.  Neck: no JVD, and no carotid bruits.  Lungs: normal respiratory effort, no accessory muscle use, normal breath  sounds, no crackles, and no wheezes. Heart: normal rate, regular rhythm, no murmur, no gallop, and no rub.  Abdomen: soft, non-tender, normal bowel sounds, mild-moderate distention, no guarding, no rebound tenderness. Pulses: 2+ DP/PT pulses bilaterally  Extremities: No cyanosis, clubbing. 1+ pitting edema to knee R leg, pitting edema not evaluated in L leg due to infection.  Left Lower Extremity: Significant erythema and swelling below the mid calf extending down through foot.  1st and 2nd toes very erythematous.  Raw from debridement of dead skin this morning.  1st toes distally packed with tape.  1st two toes very erythematous.  Sensation decreased in both toes but he still has some sensation.  Sensation intact in other three toes.  No active drainage at this time.  Evidence of extensive scarring from past infections on either side of ankle down to midfoot.  Possible small active ulcer laterally.      Neurologic: alert & oriented X3, cranial nerves II-XII intact, strength normal upper extremities.       ED Course  Procedures (including critical care time)  Agree with wound care physician that this patient needs IV antibiotic therapy and likely a surgical consult.  Will order comprehensive labs, blood cultures, Vanc and Zosyn for broad spectrum coverage once blood cultures drawn.  Will call internal medicine for admission once lab results are back.   Admission on 04/30/2011  Component Date Value Range Status  . Sodium (mEq/L) 04/30/2011 135  135-145 Final  . Potassium (mEq/L) 04/30/2011 4.4  3.5-5.1 Final  . Chloride (mEq/L) 04/30/2011 99  96-112 Final  . CO2 (mEq/L) 04/30/2011 26  19-32 Final  . Glucose, Bld (mg/dL) 16/02/9603 540* 98-11 Final  . BUN (mg/dL) 91/47/8295 26* 6-21 Final  . Creatinine, Ser (mg/dL) 30/86/5784 6.96* 2.95-2.84 Final  . Calcium (mg/dL) 13/24/4010 8.8  2.7-25.3 Final  . Total Protein (g/dL) 66/44/0347 7.5  4.2-5.9 Final  . Albumin (g/dL) 56/38/7564 2.8*  3.3-2.9 Final  . AST (U/L) 04/30/2011 22  0-37 Final  . ALT (U/L) 04/30/2011 18  0-53 Final  . Alkaline Phosphatase (U/L) 04/30/2011 66  39-117 Final  . Total Bilirubin (mg/dL) 51/88/4166 0.7  0.6-3.0 Final  . GFR calc non Af Amer (mL/min) 04/30/2011 36* >90 Final  . GFR calc Af Amer (mL/min) 04/30/2011 41* >90 Final   Comment:  The eGFR has been calculated                          using the CKD EPI equation.                          This calculation has not been                          validated in all clinical                          situations.                          eGFR's persistently                          <90 mL/min signify                          possible Chronic Kidney Disease.  . Sed Rate (mm/hr) 04/30/2011 109* 0-16 Final  . WBC (K/uL) 04/30/2011 6.1  4.0-10.5 Final  . RBC (MIL/uL) 04/30/2011 3.29* 4.22-5.81 Final  . Hemoglobin (g/dL) 11/91/4782 9.9* 95.6-21.3 Final  . HCT (%) 04/30/2011 28.6* 39.0-52.0 Final  . MCV (fL) 04/30/2011 86.9  78.0-100.0 Final  . MCH (pg) 04/30/2011 30.1  26.0-34.0 Final  . MCHC (g/dL) 08/65/7846 96.2  95.2-84.1 Final  . RDW (%) 04/30/2011 16.6* 11.5-15.5 Final  . Platelets (K/uL) 04/30/2011 253  150-400 Final  . Neutrophils Relative (%) 04/30/2011 77  43-77 Final  . Neutro Abs (K/uL) 04/30/2011 4.7  1.7-7.7 Final  . Lymphocytes Relative (%) 04/30/2011 13  12-46 Final  . Lymphs Abs (K/uL) 04/30/2011 0.8  0.7-4.0 Final  . Monocytes Relative (%) 04/30/2011 8  3-12 Final  . Monocytes Absolute (K/uL) 04/30/2011 0.5  0.1-1.0 Final  . Eosinophils Relative (%) 04/30/2011 2  0-5 Final  . Eosinophils Absolute (K/uL) 04/30/2011 0.1  0.0-0.7 Final  . Basophils Relative (%) 04/30/2011 0  0-1 Final  . Basophils Absolute (K/uL) 04/30/2011 0.0  0.0-0.1 Final  . Prothrombin Time (seconds) 04/30/2011 20.5* 11.6-15.2 Final  . INR  04/30/2011 1.72* 0.00-1.49 Final  ]   LEFT FOOT - COMPLETE 3+ VIEW  Comparison: Plain  films 10/05/2010.  Findings: Soft tissue defect is seen in the distal aspect of the  great toe. There is new bony destructive change in the tuft of the  great toe consistent with osteomyelitis. No soft tissue gas  collection is identified. Old fifth metatarsal fracture is noted.  No acute fracture.  IMPRESSION:  Findings consistent with osteomyelitis of the tuft of the great  toe.    Diagnosis: Osteomyelitis L great toe, abscess, severe cellulitis distal left lower extremity.  MDM  I discussed all aspects of this case with my attending Dr. Jeraldine Loots, who agreed with the entirety of the workup and assessment and plan of admission.    Spoke with Dr. Venetia Constable of [facility].  Will admit to [facility].  Temporary admission orders placed.  Spoke with Dr. Magnus Ivan orthopa[facility]ics.  He is unsure if he will be able to see the patient today but he knows about the patient and will see him tomorrow morning if not today.          Nida Boatman  Yaakov Guthrie, MD 04/30/11 1531

## 2011-04-30 NOTE — Progress Notes (Signed)
*  PRELIMINARY RESULTS* Left lower extremity venous duplex completed. There is edvience of chronic DVT noted in the popliteal extending into the distal common femoral veins with recanalization. There is no significant change since study of 06/2010.  Lionardo Haze, [place] D 04/30/2011, 3:23 PM

## 2011-04-30 NOTE — Progress Notes (Signed)
ANTICOAGULATION CONSULT NOTE - Initial Consult  Pharmacy Consult:  Coumadin/Lovenox Indication:  DVT in Feb 2012  Allergies  Allergen Reactions  . Iohexol      Code: RASH, Desc: PT STATES HE HAD A REACTION TO IV DYE 06/29/06/RM, Onset Date: 16109604     Patient Measurements: Height: 5\' 9"  (175.3 cm) Weight: 204 lb (92.534 kg) IBW/kg (Calculated) : 70.7    Vital Signs: Temp: 98.1 F (36.7 C) (12/07 1636) Temp src: Oral (12/07 1636) BP: 119/72 mmHg (12/07 1636) Pulse Rate: 83  (12/07 1636)  Labs:  Basename 04/30/11 1348  HGB 9.9*  HCT 28.6*  PLT 253  APTT --  LABPROT 20.5*  INR 1.72*  HEPARINUNFRC --  CREATININE 1.64*  CKTOTAL --  CKMB --  TROPONINI --   Estimated Creatinine Clearance: 35 ml/min (by C-G formula based on Cr of 1.64).  Medical History: Past Medical History  Diagnosis Date  . Hypertension   . Chronic kidney disease   . PAD (peripheral artery disease)   . Rheumatoid arthritis   . CKD (chronic kidney disease), stage III   . Anemia   . Gout      Assessment: 25 YOM with hx DVT in Feb 2012 and possible PE to continue on Coumadin and add Lovenox bridge while INR subtherapeutic.  Patient reports taking Coumadin 5mg  PO yesterday.  Noted pt with CKD.  Goal of Therapy:  INR 2-3   Plan:  1.  Coumadin 5mg  PO today. 2.  Lovenox 40mg  SQ daily until INR >= 2. 3.  F/U AM labs.  Phillips Climes Dien 04/30/2011,5:34 PM

## 2011-04-30 NOTE — ED Notes (Signed)
Patient reports he noted onset of swelling in his left leg last night.  He was seen by his md today and has a dressing on the leg.  He was sent to ED for further eval of infection or clot

## 2011-04-30 NOTE — ED Notes (Signed)
Attempted report. Floor nurse unable to come to phone due to pt infux per secretary and charge

## 2011-04-30 NOTE — Progress Notes (Signed)
ANTIBIOTIC CONSULT NOTE - INITIAL  Pharmacy Consult for Vancomycin and Zosyn Indication: Leg infection/Possible osteomylitis  Allergies  Allergen Reactions  . Iohexol      Code: RASH, Desc: PT STATES HE HAD A REACTION TO IV DYE 06/29/06/RM, Onset Date: [id number]     Patient Measurements: Height: 5\' 9"  (175.3 cm) Weight: 204 lb (92.534 kg) IBW/kg (Calculated) : 70.7   Vital Signs: Temp: 98.8 F (37.1 C) (12/07 1124) Temp src: Oral (12/07 1124) BP: 121/64 mmHg (12/07 1124) Pulse Rate: 108  (12/07 1124) Intake/Output from previous day:   Intake/Output from this shift:    Labs:  Basename 04/30/11 1348  WBC 6.1  HGB 9.9*  PLT 253  LABCREA --  CREATININE 1.64*   Estimated Creatinine Clearance: 35 ml/min (by C-G formula based on Cr of 1.64). No results found for this basename: VANCOTROUGH:2,VANCOPEAK:2,VANCORANDOM:2,GENTTROUGH:2,GENTPEAK:2,GENTRANDOM:2,TOBRATROUGH:2,TOBRAPEAK:2,TOBRARND:2,AMIKACINPEAK:2,AMIKACINTROU:2,AMIKACIN:2, in the last 72 hours   Microbiology: No results found for this or any previous visit (from the past 720 hour(s)).  Medical History: Past Medical History  Diagnosis Date  . Hypertension     Medications:  Scheduled:    . piperacillin-tazobactam (ZOSYN)  IV  3.375 g Intravenous Once  . vancomycin  1,000 mg Intravenous Once   Assessment: 89 YOM with renal impairment presented to the ED with leg pain and swelling to start Vancomycin and zosyn empirically. Est. crcl 35. 1g of vancomycin and 3.375 g zosyn was ordered at 1400 in ED  Goal of Therapy:  Vancomycin trough level 15-20 mcg/ml  Plan:  Vancomycin 1250mg  IV q 24hrs, next dose 12/8 at 1400 Zosyn 3.375mg  IV q8hrs next dose 2200 We will f/u renal function changes  Riki Rusk 04/30/2011,3:41 PM

## 2011-04-30 NOTE — H&P (Signed)
PCP:   [name], MD   Chief Complaint:  Drainage and pain from wound on the left big toe.  HPI: 77 year old African American male patient with history of chronic left leg wounds for years, peripheral artery disease, hypertension, chronic kidney disease, gout, DVT left leg on Coumadin, rheumatoid arthritis and? Pulmonary embolism who was sent to the emergency room for hospital admission from the wound care center. Patient noticed swelling, redness and mild pain of the left forefoot and lower leg a week ago. 4 days ago he noticed superficial wound on the left big toe with purulent drainage. He says he only has mild pain. He denies any fevers or chills. He went to the wound care center. Dr. Jimmey Ralph did incision and drainage of abscess of the first and second toe and advised the patient to come to the emergency room secondary to leg cellulitis and abscess. Orthopedic M.D. was consulted by the ED physician. Patient is in the process of getting IV Zosyn and vancomycin per pharmacy.  Past Medical History: Past Medical History  Diagnosis Date  . Hypertension   . Chronic kidney disease   . PAD (peripheral artery disease)   . Rheumatoid arthritis   . CKD (chronic kidney disease), stage III   . Anemia   . Gout     Past Surgical History: Past Surgical History  Procedure Date  . Hernia repair   . Hernia repair     Allergies:   Allergies  Allergen Reactions  . Iohexol      Code: RASH, Desc: PT STATES HE HAD A REACTION TO IV DYE 06/29/06/RM, Onset Date: [id number]     Medications: Prior to Admission medications   Medication Sig Start Date End Date Taking? Authorizing Provider  allopurinol (ZYLOPRIM) 300 MG tablet Take 150 mg by mouth daily.     Yes Historical Provider, MD  amoxicillin-clavulanate (AUGMENTIN) 875-125 MG per tablet Take 1 tablet by mouth 2 (two) times daily.     Yes Historical Provider, MD  calcium-vitamin D (OSCAL WITH D) 500-200 MG-UNIT per tablet Take 1 tablet by mouth  daily.     Yes Historical Provider, MD  dutasteride (AVODART) 0.5 MG capsule Take 0.5 mg by mouth at bedtime.     Yes Historical Provider, MD  furosemide (LASIX) 20 MG tablet Take 20 mg by mouth daily.     Yes Historical Provider, MD  Glucosamine 500 MG CAPS Take 1 capsule by mouth daily.     Yes Historical Provider, MD  lisinopril (PRINIVIL,ZESTRIL) 10 MG tablet Take 5 mg by mouth daily.     Yes Historical Provider, MD  methotrexate (RHEUMATREX) 2.5 MG tablet Take 7.5 mg by mouth 2 (two) times a week. Caution:Chemotherapy. Protect from light.    Yes Historical Provider, MD  warfarin (COUMADIN) 5 MG tablet Take 5 mg by mouth daily. Takes 2.5 mg alternating with 5 mg tablets.    Yes Historical Provider, MD    Family History: History reviewed. No pertinent family history. parents both demised in their 65s or 38s from heart disease.  Social History:  reports that he has quit smoking. He does not have any smokeless tobacco history on file. He reports that he does not drink alcohol or use illicit drugs. he quit smoking 30-40 years ago. He is married. Lives with his wife. His wife has Alzheimer's dementia. He is independent of activities of daily living.  Review of Systems:  All systems reviewed and apart from history of presenting illness is pertinent for restricted  movements and intermittent pain of his left shoulder secondary to rotator cuff issues. He denies any dyspnea or chest pain or palpitations. No fever or chills. Intermittent swelling of the left leg. He says his had ulcers multiple times in his left leg for the last 50 years and has gone to wound care center with intermittent healing of some of these wounds. He developed DVT in the left leg in February of 2012.  Physical Exam: Filed Vitals:   04/30/11 1124 04/30/11 1133 04/30/11 1636  BP: 121/64  119/72  Pulse: 108  83  Temp: 98.8 F (37.1 C)  98.1 F (36.7 C)  TempSrc: Oral  Oral  Resp: 18    Height:  5\' 9"  (1.753 m)   Weight:   92.534 kg (204 lb)   SpO2: 98%     General exam: Patient is a moderately built and nourished male patient was lying comfortably supine on the gurney in no obvious distress. Head, eyes and ENT: Nontraumatic normocephalic. Pupils equally reacting to light and accommodation. Bilateral arcus sternalis. Oral mucosa is moist. Lymphatics no lymphadenopathy. Neck: Supple. No JVD or carotid bruit Respiratory system: Clear. No increased work of breathing. Cardiovascular system: S1 and S2 heard, regular. No JVD or murmurs. Gastrointestinal system: Abdomen is nondistended, soft and normal bowel sounds heard. No organomegaly or mass appreciated. Central nervous system: Alert and oriented. No focal neurological deficit. Extremities: Left shoulder movement is restricted to 45 abduction. No acute findings. Left leg is swollen below the knee. Redness warmth and mild tenderness from below the mid shin to the toes. Patient has healed scars of wounds on the medial aspect of the left ankle. He has a chronic approximately 0.5 cm diameter wound on the lateral aspect of his left heel with no acute findings. Skin over the dorsum of the first toe is denuded, read. Currently there is no discharge. The forefoot however is swollen like the rest of the lower leg. Difficult to appreciate peripheral pulsations.  Labs on Admission:   [facility] 04/30/11 1348  NA 135  K 4.4  CL 99  CO2 26  GLUCOSE 101*  BUN 26*  CREATININE 1.64*  CALCIUM 8.8  MG --  PHOS --    Basename 04/30/11 1348  AST 22  ALT 18  ALKPHOS 66  BILITOT 0.7  PROT 7.5  ALBUMIN 2.8*   No results found for this basename: LIPASE:2,AMYLASE:2 in the last 72 hours  Basename 04/30/11 1348  WBC 6.1  NEUTROABS 4.7  HGB 9.9*  HCT 28.6*  MCV 86.9  PLT 253    Radiological Exams on Admission: Dg Foot Complete Left  04/30/2011  *RADIOLOGY REPORT*  Clinical Data: Question osteomyelitis of the great toe.  LEFT FOOT - COMPLETE 3+ VIEW  Comparison: Plain  films 10/05/2010.  Findings: Soft tissue defect is seen in the distal aspect of the great toe.  There is new bony destructive change in the tuft of the great toe consistent with osteomyelitis.  No soft tissue gas collection is identified.  Old fifth metatarsal fracture is noted. No acute fracture.  IMPRESSION: Findings consistent with osteomyelitis of the tuft of the great toe.  Original Report Authenticated By: Bernadene Bell. Maricela Curet, M.D.      Assessment/Plan 1. Abscess of the left great toe, possible acute osteomyelitis with associated cellulitis of the left leg foot. Patient is status post incision drainage by the wound care M.D. at the wound care center. We'll admit patient to medical floor. Treat empirically with IV Zosyn and  vancomycin. Orthopedic M.D. has been consulted and await their recommendations regarding further evaluation and management such as MRI and need for surgery. Pain management. 2. Hypertension: Continue home dose of lisinopril and monitor 3. Chronic kidney disease stage III: Monitor daily BMP 4. Anemia: Possibly secondary to chronic kidney disease. Monitor daily CBCs 5. History of venous thromboembolism: INR is subtherapeutic. Treat with prophylactic Lovenox and full dose Coumadin per pharmacy 6. History of rheumatoid arthritis: Will hold methotrexate for now secondary to ongoing acute infection. 7. History of gout: Stable 8. Full code  Elsworth Ledin 04/30/2011, 4:51 PM

## 2011-04-30 NOTE — Consult Note (Signed)
Reason for Consult:  Left Great Toe Infection/Osteo Referring Physician: ER MD and [facility]  [name] [name] is an 77 y.o. male.  HPI:   77yo male with chronic venous stasis and circulation issue bilateral lower extremities.  Has been treated by the Wound Center for sometime now.  Sent to [facility] [place] ER from the Wound Center today after a debridement was performed there and incision made with packing placed into the left great toe.  [name] [name] says they also removed the toe nail today.  Ortho consulted now to address the left great toe given x-ray findings consistent with osteo at the tip of the toe.  Past Medical History  Diagnosis Date  . Hypertension   . Chronic kidney disease   . PAD (peripheral artery disease)   . Rheumatoid arthritis   . CKD (chronic kidney disease), stage III   . Anemia   . Gout     Past Surgical History  Procedure Date  . Hernia repair   . Hernia repair     History reviewed. No pertinent family history.  Social History:  reports that he has quit smoking. He does not have any smokeless tobacco history on file. He reports that he does not drink alcohol or use illicit drugs.  Allergies:  Allergies  Allergen Reactions  . Iohexol      Code: RASH, Desc: PT STATES HE HAD A REACTION TO IV DYE 06/29/06/RM, Onset Date: [id number]     Medications: I have reviewed the patient's current medications.  Results for orders placed during the hospital encounter of 04/30/11 (from the past 48 hour(s))  COMPREHENSIVE METABOLIC PANEL     Status: Abnormal   Collection Time   04/30/11  1:48 PM      Component Value Range Comment   Sodium 135  135 - 145 (mEq/L)    Potassium 4.4  3.5 - 5.1 (mEq/L)    Chloride 99  96 - 112 (mEq/L)    CO2 26  19 - 32 (mEq/L)    Glucose, Bld 101 (*) 70 - 99 (mg/dL)    BUN 26 (*) 6 - 23 (mg/dL)    Creatinine, Ser 8.46 (*) 0.50 - 1.35 (mg/dL)    Calcium 8.8  8.4 - 10.5 (mg/dL)    Total Protein 7.5  6.0 - 8.3 (g/dL)    Albumin 2.8 (*) 3.5  - 5.2 (g/dL)    AST 22  0 - 37 (U/L)    ALT 18  0 - 53 (U/L)    Alkaline Phosphatase 66  39 - 117 (U/L)    Total Bilirubin 0.7  0.3 - 1.2 (mg/dL)    GFR calc non Af Amer 36 (*) >90 (mL/min)    GFR calc Af Amer 41 (*) >90 (mL/min)   SEDIMENTATION RATE     Status: Abnormal   Collection Time   04/30/11  1:48 PM      Component Value Range Comment   Sed Rate 109 (*) 0 - 16 (mm/hr)   CBC     Status: Abnormal   Collection Time   04/30/11  1:48 PM      Component Value Range Comment   WBC 6.1  4.0 - 10.5 (K/uL)    RBC 3.29 (*) 4.22 - 5.81 (MIL/uL)    Hemoglobin 9.9 (*) 13.0 - 17.0 (g/dL)    HCT 96.2 (*) 95.2 - 52.0 (%)    MCV 86.9  78.0 - 100.0 (fL)    MCH 30.1  26.0 - 34.0 (pg)  MCHC 34.6  30.0 - 36.0 (g/dL)    RDW 91.4 (*) 78.2 - 15.5 (%)    Platelets 253  150 - 400 (K/uL)   DIFFERENTIAL     Status: Normal   Collection Time   04/30/11  1:48 PM      Component Value Range Comment   Neutrophils Relative 77  43 - 77 (%)    Neutro Abs 4.7  1.7 - 7.7 (K/uL)    Lymphocytes Relative 13  12 - 46 (%)    Lymphs Abs 0.8  0.7 - 4.0 (K/uL)    Monocytes Relative 8  3 - 12 (%)    Monocytes Absolute 0.5  0.1 - 1.0 (K/uL)    Eosinophils Relative 2  0 - 5 (%)    Eosinophils Absolute 0.1  0.0 - 0.7 (K/uL)    Basophils Relative 0  0 - 1 (%)    Basophils Absolute 0.0  0.0 - 0.1 (K/uL)   PROTIME-INR     Status: Abnormal   Collection Time   04/30/11  1:48 PM      Component Value Range Comment   Prothrombin Time 20.5 (*) 11.6 - 15.2 (seconds)    INR 1.72 (*) 0.00 - 1.49      Dg Foot Complete Left  04/30/2011  *RADIOLOGY REPORT*  Clinical Data: Question osteomyelitis of the great toe.  LEFT FOOT - COMPLETE 3+ VIEW  Comparison: Plain films 10/05/2010.  Findings: Soft tissue defect is seen in the distal aspect of the great toe.  There is new bony destructive change in the tuft of the great toe consistent with osteomyelitis.  No soft tissue gas collection is identified.  Old fifth metatarsal fracture is  noted. No acute fracture.  IMPRESSION: Findings consistent with osteomyelitis of the tuft of the great toe.  Original Report Authenticated By: Bernadene Bell. D'ALESSIO, M.D.    ROS Blood pressure 119/72, pulse 83, temperature 98.1 F (36.7 C), temperature source Oral, resp. rate 18, height 5\' 9"  (1.753 m), weight 92.534 kg (204 lb), SpO2 98.00%. Physical Exam  Musculoskeletal:       Left lower leg: He exhibits edema.       Legs:      Left foot: He exhibits tenderness and deformity.       Feet:    Assessment/Plan: This is an acute on chronic issue.  He needs IV antibiotics to treat his cellulitis and likely an amputation of the left great toe.  This does not need to be done emergently since he is non-septic appearing and has stable labs.  I spoke with him in length about this and he understands.  I will check on him tomorrow and make plans for surgery in the near future.

## 2011-05-01 LAB — CBC
MCH: 29.2 pg (ref 26.0–34.0)
MCHC: 34 g/dL (ref 30.0–36.0)
MCV: 85.8 fL (ref 78.0–100.0)
Platelets: 233 10*3/uL (ref 150–400)
RBC: 2.88 MIL/uL — ABNORMAL LOW (ref 4.22–5.81)

## 2011-05-01 LAB — COMPREHENSIVE METABOLIC PANEL
ALT: 16 U/L (ref 0–53)
AST: 23 U/L (ref 0–37)
Albumin: 2.3 g/dL — ABNORMAL LOW (ref 3.5–5.2)
CO2: 24 mEq/L (ref 19–32)
Chloride: 100 mEq/L (ref 96–112)
GFR calc non Af Amer: 35 mL/min — ABNORMAL LOW (ref >90)
Potassium: 4.2 mEq/L (ref 3.5–5.1)
Sodium: 133 mEq/L — ABNORMAL LOW (ref 135–145)
Total Bilirubin: 0.5 mg/dL (ref 0.3–1.2)

## 2011-05-01 LAB — PROTIME-INR: Prothrombin Time: 20.8 seconds — ABNORMAL HIGH (ref 11.6–15.2)

## 2011-05-01 NOTE — Progress Notes (Signed)
PCP: [name], MD  Brief narrative:  77 year old African American male patient with history of chronic left leg wounds for years, peripheral artery disease, hypertension, chronic kidney disease, gout, DVT left leg on Coumadin, rheumatoid arthritis and questionable Pulmonary embolism who was sent to the emergency room for hospital admission from the wound care center. Patient noticed swelling, redness and mild pain of the left forefoot and lower leg a week ago. 4 days ago he noticed superficial wound on the left big toe with purulent drainage. He went to the wound care center. Dr. Jimmey Ralph did incision and drainage of abscess of the first and second toe and advised the patient to come to the emergency room secondary to leg cellulitis and abscess. Orthopedic M.D. was consulted by the ED physician.   Past medical history:   Hypertension    .  Chronic kidney disease    .  PAD (peripheral artery disease)    .  Rheumatoid arthritis    .  CKD (chronic kidney disease), stage III    .  Anemia    .  Gout     Past Surgical History:  Past Surgical History   Procedure  Date   .  Hernia repair    .  Hernia repair      Consultants: Dr. Magnus Ivan (Ortho)  Procedures: None so far  Subjective: Patient denies significant pain. Diagnosed with DVT in feb 2012.  Objective: Vital signs in last 24 hours: Temp:  [98.1 F (36.7 C)-99.6 F (37.6 C)] 99.6 F (37.6 C) (12/08 0500) Pulse Rate:  [83-96] 93  (12/08 0500) Resp:  [20] 20  (12/08 0500) BP: (112-131)/(64-73) 112/64 mmHg (12/08 0500) SpO2:  [97 %-100 %] 97 % (12/08 0500) Weight:  [88.055 kg (194 lb 2 oz)-92.534 kg (204 lb)] 194 lb 2 oz (88.055 kg) (12/07 2047) Weight change:  Last BM Date: 04/30/11  Intake/Output from previous day:   Intake/Output this shift: Total I/O In: 600 [P.O.:600] Out: 300 [Urine:300]  General appearance: alert, cooperative, appears stated age and no distress Head: Normocephalic, without obvious  abnormality, atraumatic Resp: clear to auscultation bilaterally Cardio: regular rate and rhythm, S1, S2 normal, no murmur, click, rub or gallop GI: soft, non-tender; bowel sounds normal; no masses,  no organomegaly Extremities: edema erythema of left leg is seen. Nontender. Foot covered in dressing., venous stasis dermatitis noted and swelling of left leg compared to right Pulses: poor pulses Skin: erythema noted left leg Neurologic: Grossly normal Incision/Wound:Left foot is covered in dressing.  Lab Results:  [facility] 05/01/11 0635 04/30/11 1348  WBC 4.1 6.1  HGB 8.4* 9.9*  HCT 24.7* 28.6*  PLT 233 253   BMET  Basename 05/01/11 0635 04/30/11 1348  NA 133* 135  K 4.2 4.4  CL 100 99  CO2 24 26  GLUCOSE 93 101*  BUN 21 26*  CREATININE 1.66* 1.64*  CALCIUM 8.1* 8.8    Studies/Results: Dg Foot Complete Left  04/30/2011  *RADIOLOGY REPORT*  Clinical Data: Question osteomyelitis of the great toe.  LEFT FOOT - COMPLETE 3+ VIEW  Comparison: Plain films 10/05/2010.  Findings: Soft tissue defect is seen in the distal aspect of the great toe.  There is new bony destructive change in the tuft of the great toe consistent with osteomyelitis.  No soft tissue gas collection is identified.  Old fifth metatarsal fracture is noted. No acute fracture.  IMPRESSION: Findings consistent with osteomyelitis of the tuft of the great toe.  Original Report Authenticated By: Maisie Fus  L. Maricela Curet, M.D.    Medications:  Scheduled:    . allopurinol  150 mg Oral Daily  . calcium-vitamin D  1 tablet Oral Daily  . dutasteride  0.5 mg Oral QHS  . enoxaparin (LOVENOX) injection  40 mg Subcutaneous To Major  . enoxaparin (LOVENOX) injection  40 mg Subcutaneous Q24H  . furosemide  20 mg Oral Daily  . lisinopril  5 mg Oral Daily  . piperacillin-tazobactam (ZOSYN)  IV  3.375 g Intravenous Once  . piperacillin-tazobactam (ZOSYN)  IV  3.375 g Intravenous Q8H  . vancomycin  1,250 mg Intravenous Q24H  .  vancomycin  1,000 mg Intravenous Once  . warfarin  5 mg Oral To Major    Principal Problem:  *Osteomyelitis of ankle or foot, left, acute Active Problems:  Cellulitis and abscess of foot  HTN (hypertension)  CKD (chronic kidney disease), stage III  Anemia  VTE (venous thromboembolism)   Assessment/Plan: 1. Osteomyelitis of Left 1st toe: On Vanc and Zosyn. Ortho considering amputation. Pain reasonably well controlled.   2. DVT of Left LE: Diagnosed in Feb 2012. No CT reports to suggest PE. Dopplers done yesterday revealed chronic DVT in left leg. No need for bridging heparin as event was more than 6 months ago and no acute DVT is noted. Hold warfarin for now if surgery is being contemplated. Agree with DVT prophylaxis. Will benefit from compression stockings at home to prevent post-phlebitis syndrome.  3. HTN: Stable. Continue lisinopril.  4. CKD: Stable  5. Anemia: Drop in hgb noted. No overt bleed. Check Anemia panel. Baseline hgb around 10.  6. Full Code  7. Will involve PT/OT post op.  Discussed with Dr. Magnus Ivan. He plans to take him to OR on Sunday.   LOS: 1 day   [facility] Pager [phone] 05/01/2011, 11:26 AM

## 2011-05-01 NOTE — Progress Notes (Signed)
Subjective: No acute changes overnight.  Comfortable.  Understands the needs for surgery on his right great toe.   Objective: Vital signs in last 24 hours: Temp:  [98.1 F (36.7 C)-99.6 F (37.6 C)] 99.6 F (37.6 C) (12/08 0500) Pulse Rate:  [83-96] 93  (12/08 0500) Resp:  [20] 20  (12/08 0500) BP: (112-131)/(64-73) 112/64 mmHg (12/08 0500) SpO2:  [97 %-100 %] 97 % (12/08 0500) Weight:  [88.055 kg (194 lb 2 oz)] 194 lb 2 oz (88.055 kg) (12/07 2047)  Intake/Output from previous day:   Intake/Output this shift: Total I/O In: 600 [P.O.:600] Out: 300 [Urine:300]   Basename 05/01/11 0635 04/30/11 1348  HGB 8.4* 9.9*    Basename 05/01/11 0635 04/30/11 1348  WBC 4.1 6.1  RBC 2.88* 3.29*  HCT 24.7* 28.6*  PLT 233 253    Basename 05/01/11 0635 04/30/11 1348  NA 133* 135  K 4.2 4.4  CL 100 99  CO2 24 26  BUN 21 26*  CREATININE 1.66* 1.64*  GLUCOSE 93 101*  CALCIUM 8.1* 8.8    Basename 05/01/11 0635 04/30/11 1348  LABPT -- --  INR 1.76* 1.72*    Exam: Mild cellulitis right leg Red great toe with drainage  Assessment/Plan: Will need and I&D and amputation of his right great toe.  I will plan on this for tomorrow. NPO after midnight tonight. Consent   Kathryne Hitch 05/01/2011, 11:37 AM

## 2011-05-02 ENCOUNTER — Encounter (HOSPITAL_COMMUNITY): Payer: Self-pay | Admitting: Anesthesiology

## 2011-05-02 ENCOUNTER — Inpatient Hospital Stay (HOSPITAL_COMMUNITY): Payer: Medicare Other | Admitting: Anesthesiology

## 2011-05-02 ENCOUNTER — Encounter (HOSPITAL_COMMUNITY)
Admission: EM | Disposition: A | Discharge status: Skilled Nursing Facility | Payer: Self-pay | Source: Ambulatory Visit | Attending: Internal Medicine

## 2011-05-02 ENCOUNTER — Other Ambulatory Visit: Payer: Self-pay | Admitting: Orthopaedic Surgery

## 2011-05-02 HISTORY — PX: AMPUTATION: SHX166

## 2011-05-02 LAB — BASIC METABOLIC PANEL
BUN: 21 mg/dL (ref 6–23)
Creatinine, Ser: 1.66 mg/dL — ABNORMAL HIGH (ref 0.50–1.35)
GFR calc Af Amer: 41 mL/min — ABNORMAL LOW (ref >90)
GFR calc non Af Amer: 35 mL/min — ABNORMAL LOW (ref >90)
Glucose, Bld: 87 mg/dL (ref 70–99)
Potassium: 4.4 mEq/L (ref 3.5–5.1)

## 2011-05-02 LAB — CBC
HCT: 25.8 % — ABNORMAL LOW (ref 39.0–52.0)
Hemoglobin: 8.7 g/dL — ABNORMAL LOW (ref 13.0–17.0)
MCHC: 33.7 g/dL (ref 30.0–36.0)
MCV: 85.7 fL (ref 78.0–100.0)
RDW: 16.3 % — ABNORMAL HIGH (ref 11.5–15.5)

## 2011-05-02 LAB — PROTIME-INR: INR: 1.71 — ABNORMAL HIGH (ref 0.00–1.49)

## 2011-05-02 SURGERY — AMPUTATION DIGIT
Anesthesia: General | Site: Toe | Laterality: Left | Wound class: Dirty or Infected

## 2011-05-02 MED ORDER — FENTANYL CITRATE 0.05 MG/ML IJ SOLN
INTRAMUSCULAR | Status: DC | PRN
  Administered 2011-05-02: 100 ug via INTRAVENOUS

## 2011-05-02 MED ORDER — PROPOFOL 10 MG/ML IV EMUL
INTRAVENOUS | Status: DC | PRN
  Administered 2011-05-02: 150 mg via INTRAVENOUS

## 2011-05-02 MED ORDER — SODIUM CHLORIDE 0.9 % IV SOLN
INTRAVENOUS | Status: DC | PRN
  Administered 2011-05-02: 10:00:00 via INTRAVENOUS

## 2011-05-02 MED ORDER — ONDANSETRON HCL 4 MG/2ML IJ SOLN
INTRAMUSCULAR | Status: DC | PRN
  Administered 2011-05-02: 4 mg via INTRAVENOUS

## 2011-05-02 MED ORDER — SODIUM CHLORIDE 0.9 % IR SOLN
Status: DC | PRN
  Administered 2011-05-02: 1

## 2011-05-02 MED ORDER — PHENYLEPHRINE HCL 10 MG/ML IJ SOLN
INTRAMUSCULAR | Status: DC | PRN
  Administered 2011-05-02 (×5): 80 ug via INTRAVENOUS

## 2011-05-02 SURGICAL SUPPLY — 51 items
BANDAGE COBAN STERILE 4 (GAUZE/BANDAGES/DRESSINGS) ×2 IMPLANT
BANDAGE GAUZE 4  KLING STR (GAUZE/BANDAGES/DRESSINGS) IMPLANT
BANDAGE GAUZE ELAST BULKY 4 IN (GAUZE/BANDAGES/DRESSINGS) ×2 IMPLANT
BLADE AVERAGE 25X9 (BLADE) IMPLANT
BLADE MINI RND TIP GREEN BEAV (BLADE) IMPLANT
BNDG CMPR 9X4 STRL LF SNTH (GAUZE/BANDAGES/DRESSINGS) ×1
BNDG COHESIVE 1X5 TAN STRL LF (GAUZE/BANDAGES/DRESSINGS) IMPLANT
BNDG COHESIVE 4X5 TAN STRL (GAUZE/BANDAGES/DRESSINGS) ×2 IMPLANT
BNDG COHESIVE 6X5 TAN STRL LF (GAUZE/BANDAGES/DRESSINGS) IMPLANT
BNDG ESMARK 4X9 LF (GAUZE/BANDAGES/DRESSINGS) ×2 IMPLANT
BNDG GAUZE STRTCH 6 (GAUZE/BANDAGES/DRESSINGS) IMPLANT
CLOTH BEACON ORANGE TIMEOUT ST (SAFETY) ×2 IMPLANT
CORDS BIPOLAR (ELECTRODE) IMPLANT
COVER SURGICAL LIGHT HANDLE (MISCELLANEOUS) ×2 IMPLANT
CUFF TOURNIQUET SINGLE 18IN (TOURNIQUET CUFF) IMPLANT
CUFF TOURNIQUET SINGLE 24IN (TOURNIQUET CUFF) IMPLANT
CUFF TOURNIQUET SINGLE 34IN LL (TOURNIQUET CUFF) IMPLANT
DRAPE U-SHAPE 47X51 STRL (DRAPES) IMPLANT
DURAPREP 26ML APPLICATOR (WOUND CARE) ×2 IMPLANT
ELECT REM PT RETURN 9FT ADLT (ELECTROSURGICAL) ×2
ELECTRODE REM PT RTRN 9FT ADLT (ELECTROSURGICAL) ×1 IMPLANT
GAUZE SPONGE 2X2 8PLY STRL LF (GAUZE/BANDAGES/DRESSINGS) IMPLANT
GAUZE SPONGE 4X4 12PLY STRL LF (GAUZE/BANDAGES/DRESSINGS) ×2 IMPLANT
GAUZE XEROFORM 1X8 LF (GAUZE/BANDAGES/DRESSINGS) ×2 IMPLANT
GLOVE BIOGEL PI IND STRL 8 (GLOVE) ×1 IMPLANT
GLOVE BIOGEL PI INDICATOR 8 (GLOVE) ×1
GLOVE ORTHO TXT STRL SZ7.5 (GLOVE) ×4 IMPLANT
GOWN PREVENTION PLUS LG XLONG (DISPOSABLE) IMPLANT
GOWN PREVENTION PLUS XLARGE (GOWN DISPOSABLE) ×4 IMPLANT
GOWN STRL NON-REIN LRG LVL3 (GOWN DISPOSABLE) IMPLANT
KIT BASIN OR (CUSTOM PROCEDURE TRAY) ×2 IMPLANT
KIT ROOM TURNOVER OR (KITS) ×2 IMPLANT
MANIFOLD NEPTUNE II (INSTRUMENTS) ×2 IMPLANT
NEEDLE HYPO 25GX1X1/2 BEV (NEEDLE) IMPLANT
NS IRRIG 1000ML POUR BTL (IV SOLUTION) ×2 IMPLANT
PACK ORTHO EXTREMITY (CUSTOM PROCEDURE TRAY) ×2 IMPLANT
PAD ARMBOARD 7.5X6 YLW CONV (MISCELLANEOUS) ×4 IMPLANT
PAD CAST 4YDX4 CTTN HI CHSV (CAST SUPPLIES) IMPLANT
PADDING CAST COTTON 4X4 STRL (CAST SUPPLIES)
SPECIMEN JAR SMALL (MISCELLANEOUS) ×2 IMPLANT
SPONGE GAUZE 2X2 STER 10/PKG (GAUZE/BANDAGES/DRESSINGS)
SPONGE GAUZE 4X4 12PLY (GAUZE/BANDAGES/DRESSINGS) ×2 IMPLANT
SUCTION FRAZIER TIP 10 FR DISP (SUCTIONS) IMPLANT
SUT ETHILON 2 0 FS 18 (SUTURE) IMPLANT
SUT ETHILON 2 0 PSLX (SUTURE) ×2 IMPLANT
SUT VIC AB 2-0 FS1 27 (SUTURE) IMPLANT
SYR CONTROL 10ML LL (SYRINGE) IMPLANT
TOWEL OR 17X24 6PK STRL BLUE (TOWEL DISPOSABLE) ×2 IMPLANT
TOWEL OR 17X26 10 PK STRL BLUE (TOWEL DISPOSABLE) ×2 IMPLANT
TUBE CONNECTING 12X1/4 (SUCTIONS) ×2 IMPLANT
WATER STERILE IRR 1000ML POUR (IV SOLUTION) IMPLANT

## 2011-05-02 NOTE — Consult Note (Signed)
  Patient understands the need to remove his left great toe given the infection in the bone.  Will proceed today.

## 2011-05-02 NOTE — Anesthesia Postprocedure Evaluation (Signed)
  Anesthesia Post-op Note  Patient: [name] [name]  Procedure(s) Performed:  AMPUTATION DIGIT  Patient Location: PACU  Anesthesia Type: General  Level of Consciousness: awake  Airway and Oxygen Therapy: Patient Spontanous Breathing and Patient connected to nasal cannula oxygen  Post-op Pain: none  Post-op Assessment: Post-op Vital signs reviewed, Patient's Cardiovascular Status Stable, Respiratory Function Stable and Patent Airway  Post-op Vital Signs: Reviewed and stable  Complications: No apparent anesthesia complications

## 2011-05-02 NOTE — Preoperative (Signed)
Beta Blockers   Reason not to administer Beta Blockers:Not Applicable 

## 2011-05-02 NOTE — Anesthesia Procedure Notes (Addendum)
Procedure Name: LMA Insertion Date/Time: 05/02/2011 10:39 AM Performed by: Leona Singleton A. Preoxygenation: Pre-oxygenation with 100% oxygen Intubation Type: IV induction LMA: LMA with gastric port inserted LMA Size: 4.0 Tube type: Oral Number of attempts: 1 Placement Confirmation: positive ETCO2 and breath sounds checked- equal and bilateral Tube secured with: Tape Dental Injury: Teeth and Oropharynx as per pre-operative assessment

## 2011-05-02 NOTE — Brief Op Note (Signed)
04/30/2011 - 05/02/2011  11:09 AM  PATIENT:  [name] [name]  77 y.o. male  PRE-OPERATIVE DIAGNOSIS:  left great toe osteo  POST-OPERATIVE DIAGNOSIS:  Left great toe osteo  PROCEDURE:  Procedure(s): AMPUTATION Left Great Toe thru MTP Joint  SURGEON:  Surgeon(s): Kathryne Hitch  PHYSICIAN ASSISTANT:   ASSISTANTS: none   ANESTHESIA:   general  EBL:   10cc  BLOOD ADMINISTERED:none  DRAINS: none   LOCAL MEDICATIONS USED:  NONE  SPECIMEN:  No Specimen  DISPOSITION OF SPECIMEN:  N/A  COUNTS:  YES  TOURNIQUET:  * No tourniquets in log *  DICTATION: .Other Dictation: Dictation Number [id number]  PLAN OF CARE: Admit to inpatient   PATIENT DISPOSITION:  PACU - hemodynamically stable.   Delay start of Pharmacological VTE agent (>24hrs) due to surgical blood loss or risk of bleeding:  {YES/NO/NOT APPLICABLE:20182

## 2011-05-02 NOTE — Progress Notes (Signed)
PCP: [name], MD  Brief HPI:  77 year old African American male patient with history of chronic left leg wounds for years, peripheral artery disease, hypertension, chronic kidney disease, gout, DVT left leg on Coumadin, rheumatoid arthritis and questionable Pulmonary embolism who was sent to the emergency room for hospital admission from the wound care center. Patient noticed swelling, redness and mild pain of the left forefoot and lower leg a week ago. 4 days ago he noticed superficial wound on the left big toe with purulent drainage. He went to the wound care center. Dr. Jimmey Ralph did incision and drainage of abscess of the first and second toe and advised the patient to come to the emergency room secondary to leg cellulitis and abscess. Orthopedic M.D. was consulted by the ED physician.   Past medical history:   Hypertension    .  Chronic kidney disease    .  PAD (peripheral artery disease)    .  Rheumatoid arthritis    .  CKD (chronic kidney disease), stage III    .  Anemia    .  Gout     Past Surgical History:  Past Surgical History   Procedure  Date   .  Hernia repair    .  Hernia repair      Consultants: Dr. Magnus Ivan (Ortho)  Procedures: Amputation of Left Great Toe 05/02/11  Subjective: Tolerated procedure well. Patient denies pain. Concerned about returning home early.  Objective: Vital signs in last 24 hours: Temp:  [97.5 F (36.4 C)-98.8 F (37.1 C)] 97.8 F (36.6 C) (12/09 1140) Pulse Rate:  [58-87] 58  (12/09 1144) Resp:  [9-22] 13  (12/09 1144) BP: (100-115)/(60-67) 115/60 mmHg (12/09 1143) SpO2:  [94 %-100 %] 100 % (12/09 1144) Weight change:  Last BM Date: 04/30/11  Intake/Output from previous day: 12/08 0701 - 12/09 0700 In: 1080 [P.O.:1080] Out: 800 [Urine:800] Intake/Output this shift: Total I/O In: 250 [I.V.:250] Out: 50 [Blood:50]  General appearance: alert, cooperative, appears stated age and no distress Head: Normocephalic, without  obvious abnormality, atraumatic Resp: clear to auscultation bilaterally Cardio: regular rate and rhythm, S1, S2 normal, no murmur, click, rub or gallop GI: soft, non-tender; bowel sounds normal; no masses,  no organomegaly Extremities: edema erythema of left leg is seen. Nontender. Foot covered in dressing.,  Pulses: poor pulses Skin: erythema noted left leg Neurologic: Grossly normal Incision/Wound:Left foot is covered in dressing.  Lab Results:  Basename 05/02/11 0500 05/01/11 0635  WBC 5.1 4.1  HGB 8.7* 8.4*  HCT 25.8* 24.7*  PLT 253 233   BMET  Basename 05/02/11 0500 05/01/11 0635  NA 136 133*  K 4.4 4.2  CL 103 100  CO2 24 24  GLUCOSE 87 93  BUN 21 21  CREATININE 1.66* 1.66*  CALCIUM 8.3* 8.1*    Studies/Results: Dg Foot Complete Left  04/30/2011  *RADIOLOGY REPORT*  Clinical Data: Question osteomyelitis of the great toe.  LEFT FOOT - COMPLETE 3+ VIEW  Comparison: Plain films 10/05/2010.  Findings: Soft tissue defect is seen in the distal aspect of the great toe.  There is new bony destructive change in the tuft of the great toe consistent with osteomyelitis.  No soft tissue gas collection is identified.  Old fifth metatarsal fracture is noted. No acute fracture.  IMPRESSION: Findings consistent with osteomyelitis of the tuft of the great toe.  Original Report Authenticated By: Bernadene Bell. Maricela Curet, M.D.    Medications:  Scheduled:    . allopurinol  150 mg  Oral Daily  . calcium-vitamin D  1 tablet Oral Daily  . dutasteride  0.5 mg Oral QHS  . enoxaparin (LOVENOX) injection  40 mg Subcutaneous To Major  . enoxaparin (LOVENOX) injection  40 mg Subcutaneous Q24H  . furosemide  20 mg Oral Daily  . lisinopril  5 mg Oral Daily  . piperacillin-tazobactam (ZOSYN)  IV  3.375 g Intravenous Q8H  . vancomycin  1,250 mg Intravenous Q24H   Assessment/Plan:  Principal Problem:  *Osteomyelitis of ankle or foot, left, acute Active Problems:  Cellulitis and abscess of foot   HTN (hypertension)  CKD (chronic kidney disease), stage III  Anemia  VTE (venous thromboembolism)    1. Osteomyelitis of Left 1st toe: On Vanc and Zosyn. S/P amputation. Pain reasonably well controlled. Wound care per Ortho. Can switch to PO antibiotics at discharge.  2. DVT of Left LE: Diagnosed in Feb 2012. No CT reports to suggest PE. Dopplers done this admission revealed chronic DVT in left leg. No need for bridging heparin as event was more than 6 months ago and no acute DVT is noted. Resume warfarin 05/03/11. Agree with DVT prophylaxis. Will benefit from compression stockings at home to prevent post-phlebitis syndrome.  3. HTN: Stable. Continue lisinopril.  4. CKD: Stable  5. Anemia: Hgb stable today. No overt bleed. Anemia panel pending. Baseline hgb around 10.  6. Full Code  7. Will involve PT/OT    LOS: 2 days   [facility] Pager [phone] 05/02/2011, 2:03 PM

## 2011-05-02 NOTE — Transfer of Care (Signed)
Immediate Anesthesia Transfer of Care Note  Patient: [name] [name]  Procedure(s) Performed:  AMPUTATION DIGIT  Patient Location: PACU  Anesthesia Type: General  Level of Consciousness: awake, alert  and patient cooperative  Airway & Oxygen Therapy: Patient Spontanous Breathing and Patient connected to nasal cannula oxygen  Post-op Assessment: Report given to PACU RN and Post -op Vital signs reviewed and stable  Post vital signs: Reviewed and stable  Complications: No apparent anesthesia complications

## 2011-05-02 NOTE — Anesthesia Preprocedure Evaluation (Addendum)
Anesthesia Evaluation  Patient identified by MRN, date of birth, ID band Patient awake    Reviewed: Allergy & Precautions, H&P , NPO status , Patient's Chart, lab work & pertinent test results  Airway Mallampati: I TM Distance: >3 FB Neck ROM: full    Dental No notable dental hx. (+) Edentulous Upper, Edentulous Lower and Dental Advisory Given   Pulmonary neg pulmonary ROS,  clear to auscultation  Pulmonary exam normal       Cardiovascular hypertension, On Medications regular Normal    Neuro/Psych Negative Neurological ROS  Negative Psych ROS   GI/Hepatic negative GI ROS, Neg liver ROS,   Endo/Other  Negative Endocrine ROSGout  Renal/GU CRF  Genitourinary negative   Musculoskeletal  (+) Arthritis -, Osteoarthritis,    Abdominal   Peds  Hematology negative hematology ROS (+) anemia ,   Anesthesia Other Findings   Reproductive/Obstetrics negative OB ROS                         Anesthesia Physical Anesthesia Plan  ASA: III  Anesthesia Plan: General   Post-op Pain Management:    Induction: Intravenous  Airway Management Planned: LMA  Additional Equipment:   Intra-op Plan:   Post-operative Plan: Extubation in OR  Informed Consent: I have reviewed the patients History and Physical, chart, labs and discussed the procedure including the risks, benefits and alternatives for the proposed anesthesia with the patient or authorized representative who has indicated his/her understanding and acceptance.     Plan Discussed with: CRNA  Anesthesia Plan Comments:         Anesthesia Quick Evaluation

## 2011-05-03 ENCOUNTER — Encounter (HOSPITAL_COMMUNITY): Payer: Self-pay | Admitting: Orthopaedic Surgery

## 2011-05-03 ENCOUNTER — Encounter (HOSPITAL_BASED_OUTPATIENT_CLINIC_OR_DEPARTMENT_OTHER): Payer: Non-veteran care

## 2011-05-03 ENCOUNTER — Inpatient Hospital Stay (HOSPITAL_COMMUNITY): Payer: Medicare Other

## 2011-05-03 LAB — BASIC METABOLIC PANEL
BUN: 21 mg/dL (ref 6–23)
CO2: 25 mEq/L (ref 19–32)
Calcium: 8.2 mg/dL — ABNORMAL LOW (ref 8.4–10.5)
Chloride: 104 mEq/L (ref 96–112)
Creatinine, Ser: 1.85 mg/dL — ABNORMAL HIGH (ref 0.50–1.35)

## 2011-05-03 LAB — CBC
HCT: 26 % — ABNORMAL LOW (ref 39.0–52.0)
MCH: 29.2 pg (ref 26.0–34.0)
MCV: 86.4 fL (ref 78.0–100.0)
RBC: 3.01 MIL/uL — ABNORMAL LOW (ref 4.22–5.81)
RDW: 16.6 % — ABNORMAL HIGH (ref 11.5–15.5)
WBC: 7.6 10*3/uL (ref 4.0–10.5)

## 2011-05-03 MED ORDER — WARFARIN SODIUM 5 MG PO TABS
5.0000 mg | ORAL_TABLET | Freq: Once | ORAL | Status: AC
  Administered 2011-05-03: 5 mg via ORAL
  Filled 2011-05-03: qty 1

## 2011-05-03 MED ORDER — WARFARIN SODIUM 2 MG PO TABS: 2.0000 mg | ORAL_TABLET | Freq: Every day | ORAL | Status: DC

## 2011-05-03 NOTE — Op Note (Signed)
NAME:  [name] [name], [name] [name] NO.:  [id number]  MEDICAL RECORD NO.:  [id number]  LOCATION:                                 FACILITY:  PHYSICIAN:  Vanita Panda. Magnus Ivan, M.D.DATE OF BIRTH:  1920-06-05  DATE OF PROCEDURE:  05/02/2011 DATE OF DISCHARGE:                              OPERATIVE REPORT   PREOPERATIVE DIAGNOSIS:  Right great toe infection with osteomyelitis.  POSTOPERATIVE DIAGNOSIS:  Right great toe infection with osteomyelitis.  PROCEDURE:  Right great toe amputation through metatarsophalangeal (MTP joint).  SURGEON:  Vanita Panda. Magnus Ivan, MD  ANESTHESIA:  General.  BLOOD LOSS:  Minimal.  COMPLICATIONS:  None.  INDICATION:  [name] is an 77 year old gentleman with a chronic venous stasis changes in his left lower extremity.  He developed a chronic wound on his left great toe and he has been treated at the Wound Center. for some time.  He then presented to the Wound Center on Friday and they performed irrigation and debridement in their office of the great toe, but recommended due to worsening cellulitis he go to the emergency room.  He was admitted to the Hospitalist Service.  They consulted me.  On x-rays, he had active osteomyelitis in the tuft of the toe with definite infection.  I recommended he undergo a great toe amputation.  The risks and benefits of this were explained to him in detail and he did wish to proceed with surgery.  PROCEDURE DESCRIPTION:  After informed consent was obtained, appropriate left foot was marked and he was brought up operating room and placed supine on the operating table.  General anesthesia was then obtained. His left foot was prepped and draped with DuraPrep and sterile drapes. A time-out was called and he was identified as correct patient, correct left foot, great toe.  I then made an incision around the great toe down to level the MTP joint and removed this in entirety.  There was gross purulence of  the tip.  I then copiously irrigated the tissues and cauterized any bleeding vessels.  We did this under no tourniquet.  I then reapproximated the skin edges with interrupted 2-0 nylon suture for well-padded closure without it being too tight.  Xeroform and well-padded sterile dressing were applied.  The patient was awakened, extubated, and taken to the recovery room in stable condition.  All final counts were correct.  There were no complications noted.     Vanita Panda. Magnus Ivan, M.D.     CYB/MEDQ  D:  05/02/2011  T:  05/02/2011  Job:  [id number]

## 2011-05-03 NOTE — Progress Notes (Signed)
Subjective: 1 Day Post-Op Procedure(s) (LRB): AMPUTATION DIGIT (Left) Patient reports pain as mild.  Has been up only limited.  Has a post-op shoe.  Objective: Vital signs in last 24 hours: Temp:  [97.4 F (36.3 C)-99.2 F (37.3 C)] 99.2 F (37.3 C) (12/10 0509) Pulse Rate:  [72-107] 107  (12/10 0509) Resp:  [18-19] 19  (12/10 0509) BP: (102-109)/(61-65) 102/65 mmHg (12/10 0509) SpO2:  [89 %-96 %] 95 % (12/10 1030)  Intake/Output from previous day: 12/09 0701 - 12/10 0700 In: 990 [P.O.:720; I.V.:270] Out: 530 [Urine:480; Blood:50] Intake/Output this shift: Total I/O In: 240 [P.O.:240] Out: -    Basename 05/03/11 0740 05/02/11 0500 05/01/11 0635 04/30/11 1348  HGB 8.8* 8.7* 8.4* 9.9*    Basename 05/03/11 0740 05/02/11 0500  WBC 7.6 5.1  RBC 3.01* 3.01*  HCT 26.0* 25.8*  PLT 259 253    Basename 05/03/11 0740 05/02/11 0500  NA 137 136  K 4.3 4.4  CL 104 103  CO2 25 24  BUN 21 21  CREATININE 1.85* 1.66*  GLUCOSE 89 87  CALCIUM 8.2* 8.3*    Basename 05/03/11 0740 05/02/11 0500  LABPT -- --  INR 1.50* 1.71*    Incision: no drainage  Assessment/Plan: 1 Day Post-Op Procedure(s) (LRB): AMPUTATION DIGIT (Left) Up with therapy Will change his dressing tomorrow. Up with PT for mobility.  BLACKMAN,CHRISTOPHER Y 05/03/2011, 12:00 PM

## 2011-05-03 NOTE — Progress Notes (Signed)
Physical Therapy Evaluation Patient Details Name: SUFYAAN PALMA MRN: [id number] DOB: [birth year] Today's Date: 05/03/2011  Problem List:  Patient Active Problem List  Diagnoses  . Osteomyelitis of ankle or foot, left, acute  . Cellulitis and abscess of foot  . PAD (peripheral artery disease)  . HTN (hypertension)  . CKD (chronic kidney disease), stage III  . Anemia  . Rheumatoid arthritis  . VTE (venous thromboembolism)    Past Medical History:  Past Medical History  Diagnosis Date  . Hypertension   . Chronic kidney disease   . PAD (peripheral artery disease)   . Rheumatoid arthritis   . CKD (chronic kidney disease), stage III   . Anemia   . Gout    Past Surgical History:  Past Surgical History  Procedure Date  . Hernia repair 1982  . Hernia repair     PT Assessment/Plan/Recommendation PT Assessment Clinical Impression Statement: Pt presents s/p Lt. great toe amputation with comorbidity of Rheumatoid Arthritis. Prior to start of treatment pt's SpO2 was ranging 88-92% on room air. Pt placed on 1L [place] O2 for mobility and remained at SpO2 =94% throughout rest of session.  Pt currently does not have 24 hour assistance and his wife has end stage dementia. Pt is not able to mobilize well enough (requires up to moderate assistance), or safely enough to return home right now.  PT Recommendation/Assessment: Patient will need skilled PT in the acute care venue PT Problem List: Decreased strength;Decreased range of motion;Decreased activity tolerance;Decreased balance;Decreased mobility;Decreased knowledge of use of DME Barriers to Discharge: Inaccessible home environment;Decreased caregiver support PT Therapy Diagnosis : Difficulty walking;Abnormality of gait;Generalized weakness PT Plan PT Frequency: Min 3X/week PT Treatment/Interventions: Gait training;Stair training;Functional mobility training;Therapeutic activities;Therapeutic exercise;Balance training;DME  instruction;Patient/family education PT Recommendation Recommendations for Other Services: OT consult (if not already ordered) Follow Up Recommendations: Skilled nursing facility (will adjust if progresses quickly) Equipment Recommended: Defer to next venue (will need full set up if D/C home) PT Goals  Acute Rehab PT Goals PT Goal Formulation: With patient Time For Goal Achievement: 2 weeks Pt will go Supine/Side to Sit: with modified independence PT Goal: Supine/Side to Sit - Progress: Progressing toward goal Pt will go Sit to Supine/Side: with modified independence PT Goal: Sit to Supine/Side - Progress: Progressing toward goal Pt will go Sit to Stand: with modified independence PT Goal: Sit to Stand - Progress: Progressing toward goal Pt will go Stand to Sit: with modified independence PT Goal: Stand to Sit - Progress: Progressing toward goal Pt will Ambulate: 16 - 50 feet;with supervision PT Goal: Ambulate - Progress: Progressing toward goal Pt will Go Up / Down Stairs: 3-5 stairs;with least restrictive assistive device;with supervision PT Goal: Up/Down Stairs - Progress: Progressing toward goal  PT Evaluation Precautions/Restrictions  Precautions Precautions: Fall Required Braces or Orthoses: Yes Other Brace/Splint: Lt. Post-op shoe. when in weight bearing Restrictions Weight Bearing Restrictions: Yes LLE Weight Bearing: Weight bearing as tolerated Prior Functioning  Home Living Lives With: Spouse (wife has advanced dementia) Type of Home: House Home Layout: Two level;Able to live on main level with bedroom/bathroom;Full bath on main level (has basement) Alternate Level Stairs-Number of Steps: Walks around outside for level entry to basement Home Access: Stairs to enter Entrance Stairs-Rails: Lawyer of Steps: 3 Bathroom Shower/Tub: Associate Professor: Yes How Accessible: Accessible via  walker Home Adaptive Equipment: Straight cane Prior Function Level of Independence: Independent with basic ADLs Driving: Yes Vocation: Retired Financial risk analyst  Arousal/Alertness: Awake/alert Overall Cognitive Status: Appears within functional limits for tasks assessed Orientation Level: Oriented X4 Cognition - Other Comments: Very sharp for age Sensation/Coordination Sensation Light Touch: Impaired by gross assessment Extremity Assessment RLE Assessment RLE Assessment: Exceptions to [facility] RLE AROM (degrees) Overall AROM Right Lower Extremity: Within functional limits for tasks assessed RLE Strength RLE Overall Strength: Deficits RLE Overall Strength Comments: Generalized deconditioning, grossly >/= 3/5. Functional weakness most evident LLE Assessment LLE Assessment: Exceptions to WFL LLE AROM (degrees) Overall AROM Left Lower Extremity:  (unable to assess secondary to bandage. ) LLE Strength LLE Overall Strength: Deficits LLE Overall Strength Comments: Generalized deconditioning, grossly >/= 3/5. unable to fully assess secondary to postop wrapping and pain.  Mobility (including Balance) Bed Mobility Bed Mobility: Yes Rolling Right: 6: Modified independent (Device/Increase time) Right Sidelying to Sit: 5: Supervision Right Sidelying to Sit Details (indicate cue type and reason): Verbal cues for efficiency, sequencing Transfers Transfers: Yes Sit to Stand: 4: Min assist Sit to Stand Details (indicate cue type and reason): Performed 2 x. Assistance secondary to weakness, decreased ability to use Lt. LE Stand to Sit: 4: Min assist Stand to Sit Details: Assist to help control descent, verbal cues for UE placement Ambulation/Gait Ambulation/Gait: Yes Ambulation/Gait Assistance: 3: Mod assist Ambulation/Gait Assistance Details (indicate cue type and reason): Assistance to decrease significant Rt. Lateral lean and maintain stability with gait. Pt with most difficulty with  turning, PT to assist with directing RW.  Ambulation Distance (Feet): 3 Feet Assistive device: Rolling walker Gait Pattern: Step-to pattern;Decreased stance time - left;Trunk flexed    End of Session PT - End of Session Equipment Utilized During Treatment: Gait belt Activity Tolerance: Patient limited by fatigue;Patient limited by pain Patient left: in chair;with call bell in reach;with family/visitor present (Nurse Tech) Nurse Communication: Mobility status for transfers;Mobility status for ambulation General Behavior During Session: [facility] for tasks performed Cognition: [facility] for tasks performed  Sherie Don 05/03/2011, 10:55 AM  Sherie Don) Carleene Mains PT, DPT Acute Rehabilitation [phone]

## 2011-05-03 NOTE — Progress Notes (Signed)
PHARMACY - ANTICOAGULATION CONSULT FOLLOW-UP NOTE  Pharmacy Consult for: Coumadin / Lovenox  Indication:  H/o DVT   Patient Data:   Allergies: Allergies  Allergen Reactions  . Iohexol      Code: RASH, Desc: PT STATES HE HAD A REACTION TO IV DYE 06/29/06/RM, Onset Date: [id number]     Patient Measurements: Height: 5\' 9"  (175.3 cm) Weight: 194 lb 2 oz (88.055 kg) IBW/kg (Calculated) : 70.7  Adjusted Body Weight:   Vital Signs: Temp:  [97.4 F (36.3 C)-100.2 F (37.9 C)] 100.2 F (37.9 C) (12/10 1326) Pulse Rate:  [72-107] 88  (12/10 1326) Resp:  [18-19] 18  (12/10 1326) BP: (102-109)/(61-65) 103/62 mmHg (12/10 1326) SpO2:  [89 %-99 %] 99 % (12/10 1326)  Intake/Output from previous day:  Intake/Output Summary (Last 24 hours) at 05/03/11 1346 Last data filed at 05/03/11 1300  Gross per 24 hour  Intake   1220 ml  Output    680 ml  Net    540 ml    Labs:  Basename 05/03/11 0740 05/02/11 0500 05/01/11 0635  HGB 8.8* 8.7* --  HCT 26.0* 25.8* 24.7*  PLT 259 253 233  APTT -- -- --  LABPROT 18.4* 20.4* 20.8*  INR 1.50* 1.71* 1.76*  HEPARINUNFRC -- -- --  CREATININE 1.85* 1.66* 1.66*  CKTOTAL -- -- --  CKMB -- -- --  TROPONINI -- -- --   Estimated Creatinine Clearance: 30.3 ml/min (by C-G formula based on Cr of 1.85).  Scheduled Medications:     . allopurinol  150 mg Oral Daily  . calcium-vitamin D  1 tablet Oral Daily  . dutasteride  0.5 mg Oral QHS  . enoxaparin (LOVENOX) injection  40 mg Subcutaneous Q24H  . furosemide  20 mg Oral Daily  . lisinopril  5 mg Oral Daily  . piperacillin-tazobactam (ZOSYN)  IV  3.375 g Intravenous Q8H  . vancomycin  1,250 mg Intravenous Q24H  . DISCONTD: warfarin  2 mg Oral q1800     Assessment:  77 y.o. male was admitted on 04/30/2011, with h/o DVT on Coumadin and Lovenox 40mg  sq daily (until INR > 2). Pharmacy consulted to restart Coumadin as today. Patient is also on D4 Vancomycin / Zosyn for osteomyelitis of L great toe s/p  amputation.   Goal of Therapy:  1. INR 2-3   Plan:  1. Coumadin 5mg  po today. 2. Lovenox 40mg  sq daily (until INR > 2).  3. F/u AM labs 4. Continue antibiotics at current doses. 5. Will obtain vancomycin trough in next few days.   Dineen Kid Thad Ranger, PharmD 05/03/2011, 1:46 PM

## 2011-05-03 NOTE — Progress Notes (Addendum)
Patient ID: [name] [name], male   DOB: 1920/08/17, 77 y.o.   MRN: [id number]   PCP: [name], MD  Brief HPI:  77 year old African American male patient with history of chronic left leg wounds for years, peripheral artery disease, hypertension, chronic kidney disease, gout, DVT left leg on Coumadin, rheumatoid arthritis and questionable Pulmonary embolism who was sent to the emergency room for hospital admission from the wound care center. Patient noticed swelling, redness and mild pain of the left forefoot and lower leg a week ago. 4 days ago he noticed superficial wound on the left big toe with purulent drainage. He went to the wound care center. Dr. Jimmey Ralph did incision and drainage of abscess of the first and second toe and advised the patient to come to the emergency room secondary to leg cellulitis and abscess. Orthopedic M.D. was consulted by the ED physician.   Past medical history:   Hypertension    .  Chronic kidney disease    .  PAD (peripheral artery disease)    .  Rheumatoid arthritis    .  CKD (chronic kidney disease), stage III    .  Anemia    .  Gout     Past Surgical History:  Past Surgical History   Procedure  Date   .  Hernia repair    .  Hernia repair      Consultants: Dr. Magnus Ivan (Ortho)  Procedures: Amputation of Left Great Toe 05/02/11  Subjective: No complaints, + bm, eating, denies SOB.  Reports he is not ready to go home because he cant walk and his wife has alzheimers and can not care for him.  Patient also adamantly refuses SNF.  Objective: Vital signs in last 24 hours: Temp:  [97.4 F (36.3 C)-99.2 F (37.3 C)] 99.2 F (37.3 C) (12/10 0509) Pulse Rate:  [72-107] 107  (12/10 0509) Resp:  [18-19] 19  (12/10 0509) BP: (102-109)/(61-65) 102/65 mmHg (12/10 0509) SpO2:  [89 %-96 %] 95 % (12/10 1030) Weight change:  Last BM Date: 05/02/11  Intake/Output from previous day: 12/09 0701 - 12/10 0700 In: 990 [P.O.:720; I.V.:270] Out: 530  [Urine:480; Blood:50] Intake/Output this shift: Total I/O In: 240 [P.O.:240] Out: -   General appearance: alert, cooperative, appears stated age and no distress.  Making jokes  Head: Normocephalic, without obvious abnormality, atraumatic Resp: clear to auscultation bilaterally Cardio: regular rate and rhythm, S1, S2 normal, no murmur, click, rub or gallop GI: soft, non-tender; bowel sounds normal; no masses,  no organomegaly Extremities: edema erythema of left leg is seen. Nontender. Foot covered in dressing, Left great toe has been removed.   Pulses: poor pulses Skin: erythema noted left leg Neurologic: Grossly normal Incision/Wound:Left foot is covered in dressing.  Lab Results:  Basename 05/03/11 0740 05/02/11 0500  WBC 7.6 5.1  HGB 8.8* 8.7*  HCT 26.0* 25.8*  PLT 259 253   BMET  Basename 05/03/11 0740 05/02/11 0500  NA 137 136  K 4.3 4.4  CL 104 103  CO2 25 24  GLUCOSE 89 87  BUN 21 21  CREATININE 1.85* 1.66*  CALCIUM 8.2* 8.3*    Medications:  Scheduled:    . allopurinol  150 mg Oral Daily  . calcium-vitamin D  1 tablet Oral Daily  . dutasteride  0.5 mg Oral QHS  . enoxaparin (LOVENOX) injection  40 mg Subcutaneous Q24H  . furosemide  20 mg Oral Daily  . lisinopril  5 mg Oral Daily  . piperacillin-tazobactam (ZOSYN)  IV  3.375 g Intravenous Q8H  . vancomycin  1,250 mg Intravenous Q24H   Assessment/Plan:  Principal Problem:  *Osteomyelitis of ankle or foot, left, acute Active Problems:  Cellulitis and abscess of foot  HTN (hypertension)  CKD (chronic kidney disease), stage III  Anemia  VTE (venous thromboembolism)    1. Osteomyelitis of Left 1st toe: On Vanc and Zosyn. S/P amputation. Pain present. Wound care per Ortho. Can switch to PO antibiotics at discharge.  Per Ortho dressing to be changed 12/11.  2. DVT of Left LE: Diagnosed in Feb 2012. No CT reports to suggest PE. Dopplers done this admission revealed chronic DVT in left leg. No need for  bridging heparin as event was more than 6 months ago and no acute DVT is noted. Will benefit from compression stockings at home to prevent post-phlebitis syndrome. On lovenox as an inpatient.Coumadin d/c 12/8.  Restarting Coumadin 05/03/11  3. HTN: Stable. Continue lisinopril.  Borderline hypotensive.  Will monitor.  4. CKD: Creatinine beginning to trend up.  (Vanc? Lovenox?)  Will monitor closely for acute on chronic renal failure.  5. Anemia: Hgb stable today. No overt bleed. Anemia panel pending. Baseline hgb around 10.  6. Full Code  7. Disposition:  PT recommends SNF.  Patient refuses.  Will consult Social Work.    LOS: 3 days   [name] [name]  [facility] Pager [phone] 05/03/2011, 12:54 PM  Patient seen and examined. Agree with above note with changes as made. Patient to discuss with family regarding discharge plans. Will ask SW and CM to facilitate as well. Monitor renal function closely.  Will get CXR to evaluate hypoxia.  [name] [name] 05/03/2011 4:35 PM

## 2011-05-04 LAB — CBC
HCT: 25.1 % — ABNORMAL LOW (ref 39.0–52.0)
Hemoglobin: 8.4 g/dL — ABNORMAL LOW (ref 13.0–17.0)
MCH: 29.4 pg (ref 26.0–34.0)
MCHC: 33.5 g/dL (ref 30.0–36.0)
MCV: 87.8 fL (ref 78.0–100.0)
RBC: 2.86 MIL/uL — ABNORMAL LOW (ref 4.22–5.81)

## 2011-05-04 LAB — BASIC METABOLIC PANEL
BUN: 24 mg/dL — ABNORMAL HIGH (ref 6–23)
CO2: 24 mEq/L (ref 19–32)
Chloride: 101 mEq/L (ref 96–112)
Glucose, Bld: 100 mg/dL — ABNORMAL HIGH (ref 70–99)
Potassium: 4.1 mEq/L (ref 3.5–5.1)
Sodium: 135 mEq/L (ref 135–145)

## 2011-05-04 LAB — VANCOMYCIN, TROUGH: Vancomycin Tr: 19 ug/mL (ref 10.0–20.0)

## 2011-05-04 MED ORDER — DOXYCYCLINE HYCLATE 100 MG PO TABS
100.0000 mg | ORAL_TABLET | Freq: Two times a day (BID) | ORAL | Status: DC
  Administered 2011-05-04 – 2011-05-05 (×3): 100 mg via ORAL
  Filled 2011-05-04 (×4): qty 1

## 2011-05-04 MED ORDER — AMOXICILLIN-POT CLAVULANATE 875-125 MG PO TABS
1.0000 | ORAL_TABLET | Freq: Two times a day (BID) | ORAL | Status: DC
  Administered 2011-05-04 – 2011-05-05 (×3): 1 via ORAL
  Filled 2011-05-04 (×4): qty 1

## 2011-05-04 MED ORDER — GUAIFENESIN ER 600 MG PO TB12
600.0000 mg | ORAL_TABLET | Freq: Two times a day (BID) | ORAL | Status: DC
  Administered 2011-05-04 – 2011-05-05 (×2): 600 mg via ORAL
  Filled 2011-05-04 (×3): qty 1

## 2011-05-04 MED ORDER — SODIUM CHLORIDE 0.9 % IV SOLN
INTRAVENOUS | Status: DC
  Administered 2011-05-04 – 2011-05-05 (×2): via INTRAVENOUS

## 2011-05-04 MED ORDER — ALBUTEROL SULFATE HFA 108 (90 BASE) MCG/ACT IN AERS
2.0000 | INHALATION_SPRAY | Freq: Four times a day (QID) | RESPIRATORY_TRACT | Status: DC
  Administered 2011-05-04 – 2011-05-05 (×5): 2 via RESPIRATORY_TRACT
  Filled 2011-05-04: qty 6.7

## 2011-05-04 MED ORDER — ENOXAPARIN SODIUM 30 MG/0.3ML ~~LOC~~ SOLN
30.0000 mg | SUBCUTANEOUS | Status: DC
  Administered 2011-05-04: 30 mg via SUBCUTANEOUS
  Filled 2011-05-04 (×2): qty 0.3

## 2011-05-04 MED ORDER — WARFARIN SODIUM 6 MG PO TABS
6.0000 mg | ORAL_TABLET | Freq: Once | ORAL | Status: AC
  Administered 2011-05-04: 6 mg via ORAL
  Filled 2011-05-04: qty 1

## 2011-05-04 MED ORDER — FUROSEMIDE 20 MG PO TABS
20.0000 mg | ORAL_TABLET | Freq: Every day | ORAL | Status: DC
  Filled 2011-05-04: qty 1

## 2011-05-04 NOTE — Progress Notes (Signed)
Patient seen and evaluated. Discussed with Ms. [name] [name]. I agree with her assessment and plan. Trial IV fluids. Hold Lasix for now.

## 2011-05-04 NOTE — Progress Notes (Signed)
Patient ID: [name] [name], male   DOB: [birth year], 77 y.o.   MRN: [id number] Patient ID: [name] [name], male   DOB: [birth year], 77 y.o.   MRN: [id number]   PCP: [name], MD  Brief HPI:  77 year old African American male patient with history of chronic left leg wounds for years, peripheral artery disease, hypertension, chronic kidney disease, gout, DVT left leg on Coumadin, rheumatoid arthritis and questionable Pulmonary embolism who was sent to the emergency room for hospital admission from the wound care center. Patient noticed swelling, redness and mild pain of the left forefoot and lower leg a week ago. 4 days ago he noticed superficial wound on the left big toe with purulent drainage. He went to the wound care center. Dr. Jimmey Ralph did incision and drainage of abscess of the first and second toe and advised the patient to come to the emergency room secondary to leg cellulitis and abscess. Orthopedic M.D. was consulted by the ED physician.   Past medical history:   Hypertension    .  Chronic kidney disease    .  PAD (peripheral artery disease)    .  Rheumatoid arthritis    .  CKD (chronic kidney disease), stage III    .  Anemia    .  Gout     Past Surgical History:  Past Surgical History   Procedure  Date   .  Hernia repair    .  Hernia repair      Consultants: Dr. Magnus Ivan (Ortho)  Procedures: Amputation of Left Great Toe 05/02/11  Subjective: No complaints, + bm, eating, denies SOB.  Reports he is not ready to go home because he cant walk and his wife has alzheimers and can not care for him.  Patient also adamantly refuses SNF.  Objective: Vital signs in last 24 hours: Temp:  [99.7 F (37.6 C)-100.2 F (37.9 C)] 99.9 F (37.7 C) (12/11 0627) Pulse Rate:  [88-97] 97  (12/11 0627) Resp:  [18] 18  (12/11 0627) BP: (93-103)/(58-65) 102/60 mmHg (12/11 1004) SpO2:  [95 %-99 %] 97 % (12/11 0627) Weight change:  Last BM Date: 05/03/11  Intake/Output from previous  day: 12/10 0701 - 12/11 0700 In: 640 [P.O.:540; IV Piggyback:100] Out: 725 [Urine:725] Intake/Output this shift:    General appearance: alert, cooperative, appears stated age and no distress.  Making jokes  Head: Normocephalic, without obvious abnormality, atraumatic Resp: min. Bi-lateral expiratory wheeze posteriorly.  No increased wob. Cardio: regular rate and rhythm, S1, S2 normal, no murmur, click, rub or gallop GI: soft, non-tender; bowel sounds normal; no masses,  no organomegaly Extremities: edema erythema of left leg is seen. Nontender. Foot covered in dressing, Left great toe has been removed.   Slightly more erythematous than yesterday. Pulses: poor pulses Skin: erythema noted left leg Neurologic: Grossly normal Incision/Wound:Left foot is covered in dressing.  Lab Results:  [facility] 05/04/11 0615 05/03/11 0740  WBC 7.9 7.6  HGB 8.4* 8.8*  HCT 25.1* 26.0*  PLT 256 259   BMET  Basename 05/04/11 0615 05/03/11 0740  NA 135 137  K 4.1 4.3  CL 101 104  CO2 24 25  GLUCOSE 100* 89  BUN 24* 21  CREATININE 2.05* 1.85*  CALCIUM 8.0* 8.2*    Chest Xray 05/03/11  Findings: Cardiomegaly. Central pulmonary vascular prominence without frank pulmonary edema. No pneumothorax or segmental infiltrate.   Medications:  Scheduled:    . albuterol  2 puff Inhalation Q6H  . allopurinol  150 mg Oral  Daily  . amoxicillin-clavulanate  1 tablet Oral Q12H  . calcium-vitamin D  1 tablet Oral Daily  . doxycycline  100 mg Oral Q12H  . dutasteride  0.5 mg Oral QHS  . enoxaparin (LOVENOX) injection  30 mg Subcutaneous Q24H  . furosemide  20 mg Oral Daily  . warfarin  5 mg Oral ONCE-1800  . warfarin  6 mg Oral ONCE-1800  . DISCONTD: enoxaparin (LOVENOX) injection  40 mg Subcutaneous Q24H  . DISCONTD: furosemide  20 mg Oral Daily  . DISCONTD: lisinopril  5 mg Oral Daily  . DISCONTD: piperacillin-tazobactam (ZOSYN)  IV  3.375 g Intravenous Q8H  . DISCONTD: vancomycin  1,250 mg  Intravenous Q24H  . DISCONTD: warfarin  2 mg Oral q1800   Assessment/Plan:  Principal Problem:  *Osteomyelitis of ankle or foot, left, acute Active Problems:  Cellulitis and abscess of foot  HTN (hypertension)  CKD (chronic kidney disease), stage III  Anemia  VTE (venous thromboembolism)    1. Osteomyelitis of Left 1st toe:  S/P amputation.  Wound care per Ortho. Received 5 days of IV vanc and zosyn.  Will change to oral doxy and augmentin 05/04/11.  2. DVT of Left LE: Diagnosed in Feb 2012. No CT reports to suggest PE. Dopplers done this admission revealed chronic DVT in left leg. No need for bridging heparin as event was more than 6 months ago and no acute DVT is noted. Will benefit from compression stockings at home to prevent post-phlebitis syndrome.  Restarted Coumadin on 12/10.  3. HTN: Stable. Stopped Lisinopril and lasix (secondary to rising creatinine). Monitor BPs.  4. ARF on CKD:  Will d/c lisinopril, vanc and zosyn.  Recheck bmet in am.  5. Anemia: Hgb stable today. No overt bleed. Anemia panel pending. Baseline hgb around 10.  6. Expiratory wheeze - on albuterol at home will start here in hospital.  Continue Lasix 20 mg daily.  6. Full Code  7. Disposition:  PT recommends SNF.  If patient's creatinine is improved and leg is stable on oral antibiotics will plan to d/c on 05/05/11 or 05/06/11.    LOS: 4 days   [facility]  [facility] Pager [phone] 05/04/2011, 10:31 AM

## 2011-05-04 NOTE — Progress Notes (Signed)
05/04/2011 [name] [name] Case Management Note [phone]   HOME HEALTH AGENCIES SERVING [facility]   Agencies that are Medicare-Certified and are affiliated with [facility] Agency  Telephone Number Address  [facility]   The [facility] has ownership interest in this company; however, you are under no obligation to use this agency. [phone] or  [phone] [address] [place], [place] [address]   Agencies that are Medicare-Certified and are not affiliated with [facility] Agency Telephone Number Address  [facility] [phone] Fax [phone] [address] [place], [place]  [address]  [facility] [phone] or [phone] Fax [phone] [address] [place], [place] [address]  [facility] [phone] Fax [phone] [address] [place], [place] [address]  [facility] [phone] Fax [phone] [address] [place], [place]  [address]  [facility] [phone] Fax [phone] [address], Suite [place], [place] [address]  [facility] [phone] [address] [place], [place] [address]  [facility] [phone]  [address] [place], [place] [address]  [facility] [phone] or [phone] Fax [phone] [address] [place], [place]  [address]  [facility] [phone] Fax [phone] [address] [place], [place]  [address]  [facility]  [phone] Fax [phone] [address] [place], [place] [address]               Agencies that are not Medicare-Certified and are not affiliated with [facility] Agency Telephone Number Address  [facility] [place] [phone] or [phone] Fax [phone] [address], [place]  [address]  [facility] [phone] Fax [phone] [address] [place], [place]  [address]  [facility]  [phone] Fax [phone] [address] [place], [place] [address]  [facility] [phone] Fax [phone] [address] [place], [place] [address]  [facility] [phone] or [phone] Fax [phone] [address] [place], [place]  [address]  [facility] [phone] or [phone] Fax [phone] [address], Suite [place], [place]  [address]  [facility] [phone] Fax [phone] [address] [place], [place]  [address]  [facility] [phone] [address] [place], [place]  [address]  [facility] [phone] Fax [phone] [address] [place], [place]  [address]  [facility] [phone] Fax [phone] [address] [place], [place]  [address]  [facility] [phone] Fax [phone] [address] [place], [place] [address]  [facility] [phone] Fax [phone] [address] [place], [place]  [address]   Unsure if patient will be discharging with home health  or short term SNF. Awaiting PT eval. In to offer choice to patient. Will consider and let NCM know later.

## 2011-05-04 NOTE — Progress Notes (Signed)
Utilization review completed.  

## 2011-05-04 NOTE — Progress Notes (Addendum)
PHARMACY - ANTICOAGULATION CONSULT FOLLOW-UP NOTE  Pharmacy Consult for: Coumadin / Lovenox  Indication:  H/o DVT   Patient Data:   Allergies: Allergies  Allergen Reactions  . Iohexol      Code: RASH, Desc: PT STATES HE HAD A REACTION TO IV DYE 06/29/06/RM, Onset Date: [id number]     Patient Measurements: Height: 5\' 9"  (175.3 cm) Weight: 194 lb 2 oz (88.055 kg) IBW/kg (Calculated) : 70.7  Adjusted Body Weight:   Vital Signs: Temp:  [99.7 F (37.6 C)-100.2 F (37.9 C)] 99.9 F (37.7 C) (12/11 0627) Pulse Rate:  [88-97] 97  (12/11 0627) Resp:  [18] 18  (12/11 0627) BP: (93-103)/(58-65) 102/60 mmHg (12/11 1004) SpO2:  [95 %-99 %] 97 % (12/11 0627)  Intake/Output from previous day:  Intake/Output Summary (Last 24 hours) at 05/04/11 1018 Last data filed at 05/04/11 6578  Gross per 24 hour  Intake    400 ml  Output    725 ml  Net   -325 ml    Labs:  Basename 05/04/11 0615 05/03/11 0740 05/02/11 0500  HGB 8.4* 8.8* --  HCT 25.1* 26.0* 25.8*  PLT 256 259 253  APTT -- -- --  LABPROT 18.3* 18.4* 20.4*  INR 1.49 1.50* 1.71*  HEPARINUNFRC -- -- --  CREATININE 2.05* 1.85* 1.66*  CKTOTAL -- -- --  CKMB -- -- --  TROPONINI -- -- --   Estimated Creatinine Clearance: 27.4 ml/min (by C-G formula based on Cr of 2.05).  Scheduled Medications:     . allopurinol  150 mg Oral Daily  . calcium-vitamin D  1 tablet Oral Daily  . dutasteride  0.5 mg Oral QHS  . enoxaparin (LOVENOX) injection  30 mg Subcutaneous Q24H  . furosemide  20 mg Oral Daily  . lisinopril  5 mg Oral Daily  . piperacillin-tazobactam (ZOSYN)  IV  3.375 g Intravenous Q8H  . vancomycin  1,250 mg Intravenous Q24H  . warfarin  5 mg Oral ONCE-1800  . DISCONTD: enoxaparin (LOVENOX) injection  40 mg Subcutaneous Q24H  . DISCONTD: warfarin  2 mg Oral q1800     Assessment:  77 y.o. male was admitted on 04/30/2011, with h/o DVT on Coumadin and Lovenox (DVT prophylaxis dosing) (until INR > 2). INR is  below-goal.  Goal of Therapy:  1. INR 2-3   Plan:  1. Coumadin 6 mg po today. 2. Decrease Lovenox to 30mg  sq daily (until INR > 2) as CrCl 27 ml/min.   Dineen Kid Thad Ranger, PharmD 05/04/2011, 10:18 AM

## 2011-05-04 NOTE — Progress Notes (Signed)
Clinical Social Worker completed psychosocial assessment and assessment can be found in shadow chart. Pt agreeable to SNF search for STR in [facility]. Clinical Social Worker completed FL-2 (MD please sign) and initiated SNF search in [facility], placement note can be found in shadow chart. Clinical Social Worker to follow up with pt in regard to bed offers. Pt stated that he is still considering home with [facility] services because he cares for his wife, but pt children are assisting with caring for pt wife and pt would like to explore SNF route and make final decision. Clinical Social Worker to facilitate pt D/C needs.  Jacklynn Lewis, MSW, LCSWA  Clinical Social Work [phone]

## 2011-05-04 NOTE — Progress Notes (Signed)
Occupational Therapy Evaluation Patient Details Name: [name] [name] MRN: [id number] DOB: 09-23-1920 Today's Date: 05/04/2011  Problem List:  Patient Active Problem List  Diagnoses  . Osteomyelitis of ankle or foot, left, acute  . Cellulitis and abscess of foot  . PAD (peripheral artery disease)  . HTN (hypertension)  . CKD (chronic kidney disease), stage III  . Anemia  . Rheumatoid arthritis  . VTE (venous thromboembolism)    Past Medical History:  Past Medical History  Diagnosis Date  . Hypertension   . Chronic kidney disease   . PAD (peripheral artery disease)   . Rheumatoid arthritis   . CKD (chronic kidney disease), stage III   . Anemia   . Gout    Past Surgical History:  Past Surgical History  Procedure Date  . Hernia repair 1982  . Hernia repair   . Amputation 05/02/2011    Procedure: AMPUTATION DIGIT;  Surgeon: Kathryne Hitch;  Location: [facility] OR;  Service: Orthopedics;  Laterality: Left;  left great toe    OT Assessment/Plan/Recommendation OT Assessment Clinical Impression Statement: Pt demos decline in function with UE strength, balance, activity tolerance and safety for ADLs and ADL mobility tasks. Pt would benefit from skilled OT services to address these impairments to increase independence and maximize level of function to returnh home safely. Pt is primary caregiver at home for his wife who has Alzheimer's dementia  OT Recommendation/Assessment: Patient will need skilled OT in the acute care venue OT Problem List: Decreased strength;Decreased activity tolerance;Decreased safety awareness;Impaired balance (sitting and/or standing);Decreased knowledge of use of DME or AE;Decreased knowledge of precautions;Pain Problem List Comments: Pt prefers to discharge to a SNF for short term rehab after acute d/c, however finances may be an issue. Pt states that his children will be able to care for his wife if he discharges to a SNF Barriers to Discharge: Decreased  caregiver support Barriers to Discharge Comments: Pt is primary caregiver for his wife who has Alzheimer's dementia. Pt prefers not to d/c home with [facility], even if family support/assist adequate and/or good progress on acute care OT Therapy Diagnosis : Generalized weakness OT Plan OT Frequency: Min 1X/week OT Treatment/Interventions: Self-care/ADL training;Therapeutic activities;Therapeutic exercise;Neuromuscular education;DME and/or AE instruction;Balance training;Patient/family education OT Recommendation Follow Up Recommendations: Skilled nursing facility Equipment Recommended: Defer to next venue Individuals Consulted Consulted and Agree with Results and Recommendations: Patient OT Goals Acute Rehab OT Goals OT Goal Formulation: With patient Time For Goal Achievement: 2 weeks ADL Goals Pt Will Perform Grooming: with set-up;Standing at sink Pt Will Perform Lower Body Bathing: with min assist;with adaptive equipment Pt Will Perform Lower Body Dressing: with min assist;with adaptive equipment Pt Will Transfer to Toilet: with supervision;Grab bars;with DME Pt Will Perform Toileting - Clothing Manipulation: with supervision Pt Will Perform Toileting - Hygiene: with modified independence  OT Evaluation Precautions/Restrictions  Precautions Precautions: Fall Required Braces or Orthoses: Yes Other Brace/Splint: L post op shoe Restrictions Weight Bearing Restrictions: No LLE Weight Bearing: Weight bearing as tolerated Prior Functioning Home Living Receives Help From: Family;Other (Comment) (Family helps provide care for pt's spouse) Prior Function Level of Independence: Independent with basic ADLs Driving: Yes Vocation: Retired ADL ADL Eating/Feeding: Not assessed Grooming: Performed;Shaving;Wash/dry hands;Wash/dry face;Brushing hair;Set up (Min guard assist) Where Assessed - Grooming: Standing at sink Upper Body Bathing: Simulated;Set up Where Assessed - Upper Body Bathing:  Sitting, bed Lower Body Bathing: Simulated;Moderate assistance Where Assessed - Lower Body Bathing: Sitting, bed;Sit to stand from bed Upper Body Dressing: Performed;Set up  Where Assessed - Upper Body Dressing: Sitting, chair Lower Body Dressing: Performed;Moderate assistance Where Assessed - Lower Body Dressing: Sitting, chair Toilet Transfer: Performed;Minimal assistance Toilet Transfer Details (indicate cue type and reason): verbal cues for correct hand placement, position of RW and to use grab bar Toilet Transfer Method: Ambulating Toilet Transfer Equipment: Regular height toilet;Grab bars Toileting - Clothing Manipulation: Minimal assistance Where Assessed - Toileting Clothing Manipulation: Standing Toileting - Hygiene: Performed;Supervision/safety Where Assessed - Toileting Hygiene: Sit on 3-in-1 or toilet Tub/Shower Transfer: Not assessed Tub/Shower Transfer Method: Not assessed Equipment Used: Other (comment) (RW, gait belt) Vision/Perception  Vision - History Baseline Vision: Wears glasses only for reading Visual History: Cataracts Patient Visual Report: No change from baseline Perception Perception: Within Functional Limits Cognition Cognition Arousal/Alertness: Awake/alert Overall Cognitive Status: Appears within functional limits for tasks assessed Orientation Level: Oriented X4 Sensation/Coordination Sensation Light Touch: Appears Intact Coordination Gross Motor Movements are Fluid and Coordinated: Yes Fine Motor Movements are Fluid and Coordinated: Yes Extremity Assessment RUE Assessment RUE Assessment: Within Functional Limits (MMT 4-/5) LUE Assessment LUE Assessment: Exceptions to [facility] (MMT 3/5) LUE AROM (degrees) Overall AROM Left Upper Extremity: Within functional limits for tasks assessed;Other (comment) (approximately 80 degrees shoulder flexion) Mobility  Bed Mobility Bed Mobility: Yes Rolling Right: 6: Modified independent (Device/Increase  time) Right Sidelying to Sit: 5: Supervision Transfers Transfers: Yes Sit to Stand: 4: Min assist Sit to Stand Details (indicate cue type and reason): verbal and physical cues for correct hand placement and keeping walker close to body Stand to Sit: 4: Min assist Exercises   End of Session OT - End of Session Equipment Utilized During Treatment: Gait belt;Other (comment) (RW) Activity Tolerance: Patient tolerated treatment well Patient left: in chair;with call bell in reach Nurse Communication: Mobility status for transfers General Behavior During Session: [facility] for tasks performed Cognition: [facility] for tasks performed   Galen Manila 05/04/2011, 1:57 PM

## 2011-05-04 NOTE — Progress Notes (Signed)
Patient ID: [name] [name], male   DOB: May 02, 1921, 77 y.o.   MRN: [id number] Patient trying to work with PT on mobility.  Moderate pain left leg and foot.  Exam: Left foot dressing changed.  Incision looks really good.  No drainage at all. Cellulitis decreased in leg.  Rec: WBAT in post-op she left foot. Dry dressing daily. Wait 5 days before getting incision wet.

## 2011-05-04 NOTE — Plan of Care (Signed)
Problem: Phase III Progression Outcomes Goal: Discharge plan remains appropriate-arrangements made Pt requires further rehab after acute discharge, recommend SNF for short term therapy needs

## 2011-05-05 LAB — BASIC METABOLIC PANEL
Calcium: 7.5 mg/dL — ABNORMAL LOW (ref 8.4–10.5)
GFR calc Af Amer: 39 mL/min — ABNORMAL LOW (ref >90)
GFR calc non Af Amer: 33 mL/min — ABNORMAL LOW (ref >90)
Potassium: 3.7 mEq/L (ref 3.5–5.1)
Sodium: 135 mEq/L (ref 135–145)

## 2011-05-05 LAB — PROTIME-INR
INR: 1.61 — ABNORMAL HIGH (ref 0.00–1.49)
Prothrombin Time: 19.4 seconds — ABNORMAL HIGH (ref 11.6–15.2)

## 2011-05-05 MED ORDER — AMOXICILLIN-POT CLAVULANATE 875-125 MG PO TABS: 1.0000 | ORAL_TABLET | Freq: Two times a day (BID) | ORAL | Status: DC

## 2011-05-05 MED ORDER — ALBUTEROL SULFATE HFA 108 (90 BASE) MCG/ACT IN AERS: 2.0000 | INHALATION_SPRAY | Freq: Four times a day (QID) | RESPIRATORY_TRACT | Status: DC | PRN

## 2011-05-05 MED ORDER — ENOXAPARIN SODIUM 30 MG/0.3ML ~~LOC~~ SOLN: 30.0000 mg | SUBCUTANEOUS | Status: DC

## 2011-05-05 MED ORDER — GUAIFENESIN ER 600 MG PO TB12: 600.0000 mg | ORAL_TABLET | Freq: Two times a day (BID) | ORAL | Status: AC

## 2011-05-05 MED ORDER — MOMETASONE FUROATE 0.1 % EX CREA
TOPICAL_CREAM | Freq: Every day | CUTANEOUS | Status: DC
  Administered 2011-05-05: 13:00:00 via TOPICAL
  Filled 2011-05-05: qty 15

## 2011-05-05 MED ORDER — DOXYCYCLINE HYCLATE 100 MG PO TABS: 100.0000 mg | ORAL_TABLET | Freq: Two times a day (BID) | ORAL | Status: AC

## 2011-05-05 MED ORDER — ACETAMINOPHEN 325 MG PO TABS: 650.0000 mg | ORAL_TABLET | Freq: Four times a day (QID) | ORAL | Status: AC | PRN

## 2011-05-05 MED ORDER — WARFARIN SODIUM 6 MG PO TABS
6.0000 mg | ORAL_TABLET | Freq: Once | ORAL | Status: DC
  Filled 2011-05-05: qty 1

## 2011-05-05 MED ORDER — MOMETASONE FUROATE 0.1 % EX CREA: TOPICAL_CREAM | Freq: Every day | CUTANEOUS | Status: AC

## 2011-05-05 MED ORDER — OXYCODONE HCL 5 MG PO TABS: 5.0000 mg | ORAL_TABLET | ORAL | Status: AC | PRN

## 2011-05-05 NOTE — Progress Notes (Addendum)
Patient ID: [name] [name], male   DOB: 03-22-1921, 76 y.o.   MRN: [id number] Patient ID: [name] [name], male   DOB: 1920-08-31, 77 y.o.   MRN: [id number] Patient ID: [name] [name], male   DOB: 07-06-1920, 77 y.o.   MRN: [id number]   PCP: [name], MD  Brief HPI:  77 year old African American male patient with history of chronic left leg wounds for years, peripheral artery disease, hypertension, chronic kidney disease, gout, DVT left leg on Coumadin, rheumatoid arthritis and questionable Pulmonary embolism who was sent to the emergency room for hospital admission from the wound care center. Patient noticed swelling, redness and mild pain of the left forefoot and lower leg a week ago. 4 days ago he noticed superficial wound on the left big toe with purulent drainage. He went to the wound care center. Dr. Jimmey Ralph did incision and drainage of abscess of the first and second toe and advised the patient to come to the emergency room secondary to leg cellulitis and abscess. Orthopedic M.D. was consulted by the ED physician.   Past medical history:   Hypertension    .  Chronic kidney disease    .  PAD (peripheral artery disease)    .  Rheumatoid arthritis    .  CKD (chronic kidney disease), stage III    .  Anemia    .  Gout     Past Surgical History:  Past Surgical History   Procedure  Date   .  Hernia repair    .  Hernia repair      Consultants: Dr. Magnus Ivan (Ortho)  Procedures: Amputation of Left Great Toe 05/02/11  Subjective: No complaints, + bm, eating, denies SOB.  Reports he is not ready to go home because he cant walk and his wife has alzheimers and can not care for him.  Patient also adamantly refuses SNF.  Objective: Vital signs in last 24 hours: Temp:  [98.2 F (36.8 C)-98.5 F (36.9 C)] 98.5 F (36.9 C) (12/12 0549) Pulse Rate:  [76-83] 77  (12/12 0549) Resp:  [18-20] 20  (12/12 0549) BP: (100-101)/(61-65) 101/65 mmHg (12/12 0549) SpO2:  [92 %-98 %] 92 % (12/12  0812) Weight change:  Last BM Date: 05/04/11  Intake/Output from previous day: 12/11 0701 - 12/12 0700 In: 2084.3 [P.O.:476; I.V.:1608.3] Out: 250 [Urine:250] Intake/Output this shift: Total I/O In: 240 [P.O.:240] Out: -   General appearance: alert, cooperative, appears stated age and no distress.  Making jokes  Head: Normocephalic, without obvious abnormality, atraumatic Resp: min. Bi-lateral expiratory wheeze posteriorly.  No increased wob. Cardio: regular rate and rhythm, S1, S2 normal, no murmur, click, rub or gallop GI: soft, non-tender; bowel sounds normal; no masses,  no organomegaly Extremities: edema erythema of left leg is seen. Nontender. Foot covered in dressing, Left great toe has been removed.   Slightly more erythematous than yesterday. Pulses: poor pulses Skin: erythema noted left leg Neurologic: Grossly normal Incision/Wound:Left foot is covered in dressing.  Lab Results:  [facility] 05/04/11 0615 05/03/11 0740  WBC 7.9 7.6  HGB 8.4* 8.8*  HCT 25.1* 26.0*  PLT 256 259   BMET  Basename 05/05/11 0616 05/04/11 0615  NA 135 135  K 3.7 4.1  CL 103 101  CO2 22 24  GLUCOSE 96 100*  BUN 21 24*  CREATININE 1.73* 2.05*  CALCIUM 7.5* 8.0*    Chest Xray 05/03/11  Findings: Cardiomegaly. Central pulmonary vascular prominence without frank pulmonary edema. No pneumothorax or segmental infiltrate.  Medications:  Scheduled:    . albuterol  2 puff Inhalation Q6H  . allopurinol  150 mg Oral Daily  . amoxicillin-clavulanate  1 tablet Oral Q12H  . calcium-vitamin D  1 tablet Oral Daily  . doxycycline  100 mg Oral Q12H  . dutasteride  0.5 mg Oral QHS  . enoxaparin (LOVENOX) injection  30 mg Subcutaneous Q24H  . guaiFENesin  600 mg Oral BID  . mometasone   Topical Daily  . warfarin  6 mg Oral ONCE-1800  . warfarin  6 mg Oral ONCE-1800  . DISCONTD: furosemide  20 mg Oral Daily  . DISCONTD: piperacillin-tazobactam (ZOSYN)  IV  3.375 g Intravenous Q8H  .  DISCONTD: vancomycin  1,250 mg Intravenous Q24H   Assessment/Plan:  Principal Problem:  *Osteomyelitis of ankle or foot, left, acute Active Problems:  Cellulitis and abscess of foot  HTN (hypertension)  CKD (chronic kidney disease), stage III  Anemia  VTE (venous thromboembolism)    1. Osteomyelitis of Left 1st toe:  S/P amputation.  Wound care per Ortho. Received 5 days of IV vanc and zosyn.  Changed to oral doxy and augmentin 05/04/11.  Patient without fever, but leg is painful to try to stand.  Appears stable to go to SNF.  2. DVT of Left LE: Diagnosed in Feb 2012. No CT reports to suggest PE. Dopplers done this admission revealed chronic DVT in left leg. No need for bridging heparin as event was more than 6 months ago and no acute DVT is noted. Will benefit from compression stockings at home to prevent post-phlebitis syndrome.  Restarted Coumadin on 12/10 not yet therapeutic, pharmacy dosing.  3. HTN: Borderline hypotensive.  Stopped Lisinopril 12/11 (secondary to rising creatinine). Monitor BPs.  4. ARF on CKD:  Improving.  Almost back to baseline after D/C of Vanc, Zosyn and Lisinopril.  5. Anemia: Hgb stable today. No overt bleed. Anemia panel pending. Baseline hgb around 10.  6. Expiratory wheeze - resolved with  albuterol inhaler.   Continue Lasix 20 mg daily.  6. Full Code  7. Disposition:  Patient stable for D/C when bed available.  Ortho follow up scheduled for 12/17.  Need to determine specific wound care instructions.   LOS: 5 days   Stephani Police [facility] Pager [phone] 05/05/2011, 10:30 AM

## 2011-05-05 NOTE — Discharge Summary (Signed)
Patient seen and examined today, discussed with Ms. Elyn Peers. I agree with her discharge summary as outlined. Patient does not require bridging heparin or Lovenox. His DVT is chronic in nature. He has had no symptoms to suggest pulmonary embolism.  Of note he does have chronic venous stasis and edema the left lower extremity. He noted significant cellulitis at this point.  He is stable for discharge today. Continue antibiotics and followup with orthopedics as an outpatient as well as with his primary care physician.

## 2011-05-05 NOTE — Progress Notes (Signed)
Clinical Social Worker provided pt with bed offers. Pt chooses bed at [facility]. Clinical Social Worker contacted facility who confirmed bed availability for today. MD notified. Clinical Social Worker to facilitate pt D/C needs.  Jacklynn Lewis, MSW, LCSWA  Clinical Social Work [phone]

## 2011-05-05 NOTE — Progress Notes (Signed)
Send to primary team/admitting team

## 2011-05-05 NOTE — Progress Notes (Signed)
77 year old man found to have an abscess of his left foot. He is referred to the emergency room for further evaluation and treatment. Patient was seen in consultation with orthopedics. He was placed on empiric IV antibiotics and underwent left great toe amputation to the metatarsal phalangeal joint secondary to osteomyelitis. He was seen by physical therapy and occupational therapy and skilled nursing facility rehabilitation was recommended. Unfortunately no culture was sent from the operative specimen. Blood cultures show no growth to date. Of note the patient has a history of lower extremity edema on the left and chronic venous stasis changes.  Past medical history: Hypertension, rheumatoid arthritis, chronic kidney disease stage III, BPH, DVT  Investigation: Was notable for evidence of chronic DVT in the left lower extremity, no change since February 2012  Kidney function appears to be near baseline at this point. Chronic normocytic anemia is slightly below baseline but stable. Blood pressure stable off lisinopril.  Patient seen and examined. Agree with Ms. Priscella Mann note with the following correction: Patient is not refusing skilled nursing facility placement. Can resume Lasix. Patient does not need bridging with Lovenox. Additionally by history appears to his chronic leg edema and skin changes there are chronic. I do not see evidence of significant cellulitis at this point. For complete 2 more days of oral antibiotics but as he is undergone source control infection should clear nicely.

## 2011-05-05 NOTE — Progress Notes (Signed)
PHARMACY - ANTICOAGULATION CONSULT FOLLOW-UP NOTE  Pharmacy Consult for: Coumadin / Lovenox  Indication:  H/o DVT   Patient Data:   Allergies: Allergies  Allergen Reactions  . Iohexol      Code: RASH, Desc: PT STATES HE HAD A REACTION TO IV DYE 06/29/06/RM, Onset Date: [id number]     Patient Measurements: Height: 5\' 9"  (175.3 cm) Weight: 194 lb 2 oz (88.055 kg) IBW/kg (Calculated) : 70.7  Adjusted Body Weight:   Vital Signs: Temp:  [98.2 F (36.8 C)-98.5 F (36.9 C)] 98.5 F (36.9 C) (12/12 0549) Pulse Rate:  [76-83] 77  (12/12 0549) Resp:  [18-20] 20  (12/12 0549) BP: (100-101)/(61-65) 101/65 mmHg (12/12 0549) SpO2:  [92 %-98 %] 92 % (12/12 0812)  Intake/Output from previous day:  Intake/Output Summary (Last 24 hours) at 05/05/11 1011 Last data filed at 05/05/11 0600  Gross per 24 hour  Intake 1848.33 ml  Output    250 ml  Net 1598.33 ml    Labs:  Basename 05/05/11 0616 05/04/11 0615 05/03/11 0740  HGB -- 8.4* 8.8*  HCT -- 25.1* 26.0*  PLT -- 256 259  APTT -- -- --  LABPROT 19.4* 18.3* 18.4*  INR 1.61* 1.49 1.50*  HEPARINUNFRC -- -- --  CREATININE 1.73* 2.05* 1.85*  CKTOTAL -- -- --  CKMB -- -- --  TROPONINI -- -- --   Estimated Creatinine Clearance: 32.4 ml/min (by C-G formula based on Cr of 1.73).  Scheduled Medications:     . albuterol  2 puff Inhalation Q6H  . allopurinol  150 mg Oral Daily  . amoxicillin-clavulanate  1 tablet Oral Q12H  . calcium-vitamin D  1 tablet Oral Daily  . doxycycline  100 mg Oral Q12H  . dutasteride  0.5 mg Oral QHS  . enoxaparin (LOVENOX) injection  30 mg Subcutaneous Q24H  . guaiFENesin  600 mg Oral BID  . warfarin  6 mg Oral ONCE-1800  . DISCONTD: furosemide  20 mg Oral Daily  . DISCONTD: furosemide  20 mg Oral Daily  . DISCONTD: lisinopril  5 mg Oral Daily  . DISCONTD: piperacillin-tazobactam (ZOSYN)  IV  3.375 g Intravenous Q8H  . DISCONTD: vancomycin  1,250 mg Intravenous Q24H     Assessment:  77 y.o.  male was admitted on 04/30/2011, with h/o DVT on Coumadin and Lovenox (DVT prophylaxis dosing) (until INR > 2). INR is below-goal.  Goal of Therapy:  1. INR 2-3   Plan:  1. Coumadin 6 mg po today. 2. Continue Lovenox 30mg  sq daily (until INR > 2).  3. Follow-up AM labs  Orlena Garmon C. Thad Ranger, PharmD 05/05/2011, 10:11 AM

## 2011-05-05 NOTE — Discharge Summary (Addendum)
Patient ID: [name] [name] MRN: [id number] DOB/AGE: 10-26-1920 77 y.o.  Admit date: 04/30/2011 Discharge date: 05/05/2011  Primary Care Physician:  [name], MD Orthopedic Surgeon:  Dr. Doneen Poisson  Discharge Diagnoses:   Present on Admission:  .Osteomyelitis of ankle or foot, left, acute .Cellulitis and abscess of foot *VTE (Venous Thromboembolism) *Acute on Chronic Kidney Disease III *HTN *Anemia   Current Discharge Medication List    START taking these medications   Details  acetaminophen (TYLENOL) 325 MG tablet Take 2 tablets (650 mg total) by mouth every 6 (six) hours as needed (or Fever >/= 101). Qty: 30 tablet    albuterol (PROVENTIL HFA;VENTOLIN HFA) 108 (90 BASE) MCG/ACT inhaler Inhale 2 puffs into the lungs every 6 (six) hours as needed for wheezing. Qty: 1 Inhaler, Refills: 0    doxycycline (VIBRA-TABS) 100 MG tablet Take 1 tablet (100 mg total) by mouth every 12 (twelve) hours. Qty: 28 tablet, Refills: 0         guaiFENesin (MUCINEX) 600 MG 12 hr tablet Take 1 tablet (600 mg total) by mouth 2 (two) times daily.    mometasone (ELOCON) 0.1 % cream Apply topically daily. Qty: 45 g, Refills: 0    oxyCODONE (OXY IR/ROXICODONE) 5 MG immediate release tablet Take 1 tablet (5 mg total) by mouth every 4 (four) hours as needed. Qty: 20 tablet, Refills: 0      CONTINUE these medications which have CHANGED   Details  amoxicillin-clavulanate (AUGMENTIN) 875-125 MG per tablet Take 1 tablet by mouth 2 (two) times daily. Qty: 28 tablet, Refills: 0      CONTINUE these medications which have NOT CHANGED   Details  allopurinol (ZYLOPRIM) 300 MG tablet Take 150 mg by mouth daily.      calcium-vitamin D (OSCAL WITH D) 500-200 MG-UNIT per tablet Take 1 tablet by mouth daily.      dutasteride (AVODART) 0.5 MG capsule Take 0.5 mg by mouth at bedtime.     furosemide (LASIX) 20 MG tablet Take 20 mg by mouth daily.      Glucosamine 500 MG CAPS Take 1  capsule by mouth daily.      methotrexate (RHEUMATREX) 2.5 MG tablet Take 7.5 mg by mouth 2 (two) times a week. Caution:Chemotherapy. Protect from light.     warfarin (COUMADIN) 5 MG tablet Take 5 mg by mouth daily. Takes 2.5 mg alternating with 5 mg tablets.       STOP taking these medications     lisinopril (PRINIVIL,ZESTRIL) 10 MG tablet         Consults:  Dr. Doneen Poisson of [facility].  Brief H and P: 77 year old [place] American male patient with history of chronic left leg wounds for years, peripheral artery disease, hypertension, chronic kidney disease, gout, DVT left leg on Coumadin, rheumatoid arthritis and questionable Pulmonary embolism who was sent to the emergency room for hospital admission from the wound care center. Patient noticed swelling, redness and mild pain of the left forefoot and lower leg a week ago. 4 days ago he noticed superficial wound on the left big toe with purulent drainage. He went to the wound care center. Dr. Jimmey Ralph did incision and drainage of abscess of the first and second toe and advised the patient to come to the emergency room secondary to leg cellulitis and abscess. Orthopedic M.D. was consulted by the ED physician.   Plan at the time of admission:  Abscess of the left great toe, possible acute osteomyelitis with associated cellulitis of  the left leg foot. Patient is status post incision drainage by the wound care M.D. at the wound care center. We'll admit patient to medical floor. Treat empirically with IV Zosyn and vancomycin. Orthopedic M.D. has been consulted and await their recommendations regarding further evaluation and management such as MRI and need for surgery. Pain management.  Hypertension: Continue home dose of lisinopril and monitor  Chronic kidney disease stage III: Monitor daily BMP Anemia: Possibly secondary to chronic kidney disease. Monitor daily CBCs History of venous thromboembolism: INR is subtherapeutic.  Treat with prophylactic Lovenox and full dose Coumadin per pharmacy  History of rheumatoid arthritis: Will hold methotrexate for now secondary to ongoing acute infection. History of gout: Stable  Hospital Course:  The patient underwent amputation of his left great toe without complication.  Dr. Magnus Ivan continued to follow the patient in the hospital and was very pleased with the healing progress of the wound.  He is to have dry dressing changes daily and will see Dr. Magnus Ivan in followup.  With regard to his cellulitis - on 12/10 the patient was changed from IV Vanc and Zosyn to oral antibiotics and has continued to do well. He no longer has any fever and his WBC is not elevated.  Blood cultures are still not finalized but show no growth to date.   He does have significant pain in the leg and foot when he tries to stand.    Hypertension:  The patient's blood pressure appeared low as an inpatient.  Systolic was frequently in the low 100s.  His lisinopril was discontinued (due to renal function) however his blood pressure has not risen without it.  VTE:  Lower extremity venous doppler Ultrasound was done to evaluate a previous VTE diagnosed in 06-2010.   Results:   Findings consistent with chronic deep vein thrombosis involving the left lower extremity with recanalization. - Findings indicate no evidence of superficial thrombosis of the left lower extremity. - No evidence of Baker's cyst on the left. - No significant change from study of 06/2010. Mr. Age coumadin was held for surgery but restarted 12/10.  INR to be checked at SNF on 05/06/11. Lovenox bridging was considered and felt to be unnecessary is this chronic thromboembolism.  Acute on chronic kidney disease:  Mr. Harbeson creatinine rose after several days of antibiotic treatment.  As a result his lisinopril was discontinued and his Vancomycin and Zosyn were changed to Augmentin and Doxycycline.  His creatinine is now declining. Baseline  is 1.5.  The rest of the patient's discharge diagnoses were quiet and stable during this admission.  PHYSICAL Exam:   General: Alert, awake, oriented x3, in fields use a BUN and is in no acute distress. HEENT: No bruits, no goiter. Heart: Regular rate and rhythm, without murmurs, rubs, gallops. Lungs: Clear to auscultation bilaterally. Abdomen: Soft, nontender, nondistended, positive bowel sounds. Extremities: Left lower extremity is wrapped.  Left great toe is missing.  Erythema travels up the leg to approximately 6 inches below the knee.   Neuro: Grossly intact, nonfocal.  Filed Vitals:   05/04/11 2048 05/04/11 2139 05/05/11 0549 05/05/11 0812  BP:  100/62 101/65   Pulse:  83 77   Temp:  98.4 F (36.9 C) 98.5 F (36.9 C)   TempSrc:  Oral Oral   Resp:  19 20   Height:      Weight:      SpO2: 92% 94% 94% 92%     Intake/Output Summary (Last 24 hours) at 05/05/11 1159  Last data filed at 05/05/11 0900  Gross per 24 hour  Intake 2088.33 ml  Output    250 ml  Net 1838.33 ml    Basic Metabolic Panel:  Lab 05/05/11 1610 05/04/11 0615  NA 135 135  K 3.7 4.1  CL 103 101  CO2 22 24  GLUCOSE 96 100*  BUN 21 24*  CREATININE 1.73* 2.05*  CALCIUM 7.5* 8.0*  MG -- --  PHOS -- --   Liver Function Tests:  Lab 05/01/11 0635 04/30/11 1348  AST 23 22  ALT 16 18  ALKPHOS 51 66  BILITOT 0.5 0.7  PROT 6.2 7.5  ALBUMIN 2.3* 2.8*   CBC:  Lab 05/04/11 0615 05/03/11 0740 04/30/11 1348  WBC 7.9 7.6 --  NEUTROABS -- -- 4.7  HGB 8.4* 8.8* --  HCT 25.1* 26.0* --  MCV 87.8 86.4 --  PLT 256 259 --   CBG:  Lab 04/30/11 1707  GLUCAP 123*   Coagulation:  Lab 05/05/11 0616 05/04/11 0615 05/03/11 0740 05/02/11 0500  LABPROT 19.4* 18.3* 18.4* 20.4*  INR 1.61* 1.49 1.50* 1.71*   Micro Results: Blood cultures show no growth but are still in preliminary status.   Significant Diagnostic Studies:   Pathology Report:  Toe(s), amputation, Left great - SKIN AND SUBCUTANEOUS  TISSUE WITH ASSOCIATED ACUTE GANGRENOUS INFLAMMATION AND SURFACE ULCERATION.  - ACUTE OSTEOMYELITIS. Gwendolyn Grant LI MD  Dg Foot Complete Left  04/30/2011  *RADIOLOGY REPORT*  Clinical Data: Question osteomyelitis of the great toe.  LEFT FOOT - COMPLETE 3+ VIEW  Comparison: Plain films 10/05/2010.  Findings: Soft tissue defect is seen in the distal aspect of the great toe.  There is new bony destructive change in the tuft of the great toe consistent with osteomyelitis.  No soft tissue gas collection is identified.  Old fifth metatarsal fracture is noted. No acute fracture.  IMPRESSION: Findings consistent with osteomyelitis of the tuft of the great toe.  Original Report Authenticated By: Bernadene Bell. Maricela Curet, M.D.   Lower Extremity Doppler Ultrasound:  - Findings consistent with chronic deep vein thrombosis involving the left lower extremity with recanalization. - Findings indicate no evidence of superficial thrombosis of the left lower extremity. - No evidence of Baker's cyst on the left. - No significant change from study of 06/2010.   Disposition and Follow-up:  See Dr. Doneen Poisson as scheduled on December 17 See Dr. Edwin Cap within this next 7-10 days to evaluate treatment of cellulitis, acute renal failure, chronic anemia  INR Check on 05/06/11.   Time spent on Discharge: 60 min.  SignedStephani Police 05/05/2011, 11:59 AM [phone]

## 2011-05-05 NOTE — Progress Notes (Signed)
Clinical Social Worker facilitated pt D/C needs including contacting facility. Pt arranged for pt grandson to transport him by car to facility and pt stated pt grandson is on the way to the hospital. No further social work needs at this time.  Jacklynn Lewis, MSW, LCSWA  Clinical Social Work [phone]

## 2011-05-06 LAB — CULTURE, BLOOD (ROUTINE X 2)
Culture  Setup Time: 201212072252
Culture: NO GROWTH

## 2012-06-08 ENCOUNTER — Other Ambulatory Visit: Payer: Self-pay | Admitting: Gastroenterology

## 2012-10-18 ENCOUNTER — Telehealth: Payer: Self-pay | Admitting: Cardiovascular Disease

## 2012-10-18 NOTE — Telephone Encounter (Signed)
[name] [name] stated that he cant make any appointments at this time due to financial issues

## 2013-06-04 ENCOUNTER — Ambulatory Visit
Admission: RE | Admit: 2013-06-04 | Discharge: 2013-06-04 | Disposition: A | Discharge status: Home / Self Care | Payer: Medicare HMO | Source: Ambulatory Visit | Attending: Rheumatology | Admitting: Rheumatology

## 2013-06-04 ENCOUNTER — Other Ambulatory Visit: Payer: Self-pay | Admitting: Rheumatology

## 2013-06-04 DIAGNOSIS — M542 Cervicalgia: Secondary | ICD-10-CM

## 2013-06-13 ENCOUNTER — Other Ambulatory Visit (HOSPITAL_COMMUNITY): Payer: Self-pay | Admitting: Orthopaedic Surgery

## 2013-06-17 ENCOUNTER — Emergency Department (HOSPITAL_COMMUNITY): Payer: Medicare HMO

## 2013-06-17 ENCOUNTER — Inpatient Hospital Stay (HOSPITAL_COMMUNITY)
Admission: EM | Admit: 2013-06-17 | Discharge: 2013-06-21 | DRG: 153 | Disposition: A | Discharge status: Home Health | Payer: Medicare HMO | Attending: Internal Medicine | Admitting: Internal Medicine

## 2013-06-17 ENCOUNTER — Other Ambulatory Visit: Payer: Self-pay

## 2013-06-17 ENCOUNTER — Encounter (HOSPITAL_COMMUNITY): Payer: Self-pay | Admitting: Emergency Medicine

## 2013-06-17 DIAGNOSIS — Z87891 Personal history of nicotine dependence: Secondary | ICD-10-CM

## 2013-06-17 DIAGNOSIS — S98119A Complete traumatic amputation of unspecified great toe, initial encounter: Secondary | ICD-10-CM

## 2013-06-17 DIAGNOSIS — N183 Chronic kidney disease, stage 3 unspecified: Secondary | ICD-10-CM | POA: Diagnosis present

## 2013-06-17 DIAGNOSIS — Z7901 Long term (current) use of anticoagulants: Secondary | ICD-10-CM

## 2013-06-17 DIAGNOSIS — M109 Gout, unspecified: Secondary | ICD-10-CM | POA: Diagnosis present

## 2013-06-17 DIAGNOSIS — I129 Hypertensive chronic kidney disease with stage 1 through stage 4 chronic kidney disease, or unspecified chronic kidney disease: Secondary | ICD-10-CM | POA: Diagnosis present

## 2013-06-17 DIAGNOSIS — J111 Influenza due to unidentified influenza virus with other respiratory manifestations: Principal | ICD-10-CM | POA: Diagnosis present

## 2013-06-17 DIAGNOSIS — I10 Essential (primary) hypertension: Secondary | ICD-10-CM

## 2013-06-17 DIAGNOSIS — Z79899 Other long term (current) drug therapy: Secondary | ICD-10-CM

## 2013-06-17 DIAGNOSIS — J101 Influenza due to other identified influenza virus with other respiratory manifestations: Secondary | ICD-10-CM | POA: Diagnosis present

## 2013-06-17 DIAGNOSIS — M86179 Other acute osteomyelitis, unspecified ankle and foot: Secondary | ICD-10-CM | POA: Diagnosis present

## 2013-06-17 DIAGNOSIS — Z86718 Personal history of other venous thrombosis and embolism: Secondary | ICD-10-CM

## 2013-06-17 DIAGNOSIS — K225 Diverticulum of esophagus, acquired: Secondary | ICD-10-CM | POA: Diagnosis present

## 2013-06-17 DIAGNOSIS — R5381 Other malaise: Secondary | ICD-10-CM | POA: Diagnosis present

## 2013-06-17 DIAGNOSIS — R69 Illness, unspecified: Secondary | ICD-10-CM

## 2013-06-17 DIAGNOSIS — Z5309 Procedure and treatment not carried out because of other contraindication: Secondary | ICD-10-CM

## 2013-06-17 DIAGNOSIS — M86172 Other acute osteomyelitis, left ankle and foot: Secondary | ICD-10-CM

## 2013-06-17 DIAGNOSIS — I8290 Acute embolism and thrombosis of unspecified vein: Secondary | ICD-10-CM

## 2013-06-17 DIAGNOSIS — IMO0002 Reserved for concepts with insufficient information to code with codable children: Secondary | ICD-10-CM

## 2013-06-17 DIAGNOSIS — M069 Rheumatoid arthritis, unspecified: Secondary | ICD-10-CM | POA: Diagnosis present

## 2013-06-17 LAB — CBC WITH DIFFERENTIAL/PLATELET
BASOS ABS: 0 10*3/uL (ref 0.0–0.1)
Basophils Relative: 0 % (ref 0–1)
EOS ABS: 0 10*3/uL (ref 0.0–0.7)
Eosinophils Relative: 0 % (ref 0–5)
HEMATOCRIT: 35.2 % — AB (ref 39.0–52.0)
Hemoglobin: 12.2 g/dL — ABNORMAL LOW (ref 13.0–17.0)
LYMPHS ABS: 0.8 10*3/uL (ref 0.7–4.0)
Lymphocytes Relative: 7 % — ABNORMAL LOW (ref 12–46)
MCH: 30 pg (ref 26.0–34.0)
MCHC: 34.7 g/dL (ref 30.0–36.0)
MCV: 86.7 fL (ref 78.0–100.0)
MONOS PCT: 6 % (ref 3–12)
Monocytes Absolute: 0.7 10*3/uL (ref 0.1–1.0)
NEUTROS ABS: 10.6 10*3/uL — AB (ref 1.7–7.7)
Neutrophils Relative %: 87 % — ABNORMAL HIGH (ref 43–77)
Platelets: 124 10*3/uL — ABNORMAL LOW (ref 150–400)
RBC: 4.06 MIL/uL — ABNORMAL LOW (ref 4.22–5.81)
RDW: 16.8 % — AB (ref 11.5–15.5)
WBC: 12.1 10*3/uL — ABNORMAL HIGH (ref 4.0–10.5)

## 2013-06-17 LAB — URINALYSIS, ROUTINE W REFLEX MICROSCOPIC
BILIRUBIN URINE: NEGATIVE
Glucose, UA: NEGATIVE mg/dL
Ketones, ur: NEGATIVE mg/dL
LEUKOCYTES UA: NEGATIVE
NITRITE: NEGATIVE
PH: 7.5 (ref 5.0–8.0)
Protein, ur: 30 mg/dL — AB
SPECIFIC GRAVITY, URINE: 1.018 (ref 1.005–1.030)
Urobilinogen, UA: 0.2 mg/dL (ref 0.0–1.0)

## 2013-06-17 LAB — URINE MICROSCOPIC-ADD ON

## 2013-06-17 LAB — BASIC METABOLIC PANEL
BUN: 33 mg/dL — AB (ref 6–23)
CHLORIDE: 97 meq/L (ref 96–112)
CO2: 25 mEq/L (ref 19–32)
Calcium: 8.4 mg/dL (ref 8.4–10.5)
Creatinine, Ser: 1.8 mg/dL — ABNORMAL HIGH (ref 0.50–1.35)
GFR calc non Af Amer: 31 mL/min — ABNORMAL LOW (ref >90)
GFR, EST AFRICAN AMERICAN: 36 mL/min — AB (ref >90)
Glucose, Bld: 92 mg/dL (ref 70–99)
POTASSIUM: 4.4 meq/L (ref 3.7–5.3)
Sodium: 137 mEq/L (ref 137–147)

## 2013-06-17 LAB — PROTIME-INR
INR: 1.79 — ABNORMAL HIGH (ref 0.00–1.49)
PROTHROMBIN TIME: 20.3 s — AB (ref 11.6–15.2)

## 2013-06-17 MED ORDER — ACETAMINOPHEN 325 MG PO TABS
650.0000 mg | ORAL_TABLET | Freq: Once | ORAL | Status: AC
  Administered 2013-06-17: 650 mg via ORAL
  Filled 2013-06-17: qty 2

## 2013-06-17 NOTE — ED Notes (Signed)
Per EMS, pt coming from home, pt had an unwitnessed fall in the bathroom, pt denies LOC, pt said he lost his balance and slid down the wall. Pt is on coumadin. Pt was on the floor for 3 hours until son got home. Pts son reported to EMS that pt has infection in left foot and is supposed to be admitted this Tuesday for amputation.

## 2013-06-17 NOTE — ED Provider Notes (Signed)
CSN: [id number]     Arrival date & time 06/17/13  2025 History   First MD Initiated Contact with Patient 06/17/13 2035     Chief Complaint  Patient presents with  . Fall   (Consider location/radiation/quality/duration/timing/severity/associated sxs/prior Treatment) HPI Comments: Pt states he wilted down in the bathroom today.  He slid against the wall.  Did not hit head, did not land hard on the ground.  He was unable to get up and crawled throughout the house for several hours until family arrived.  He states he has had mild cough as well as several days of fatigue and cold-like symptoms.   Patient is a 80 y.o. male presenting with fall. The history is provided by the patient. No language interpreter was used.  Fall This is a new problem. The current episode started 3 to 5 hours ago. The problem occurs constantly. The problem has not changed since onset.Pertinent negatives include no chest pain, no abdominal pain, no headaches and no shortness of breath. The symptoms are aggravated by standing. The symptoms are relieved by rest. He has tried nothing for the symptoms. The treatment provided no relief.    Past Medical History  Diagnosis Date  . Hypertension   . Chronic kidney disease   . PAD (peripheral artery disease)   . Rheumatoid arthritis(714.0)   . CKD (chronic kidney disease), stage III   . Anemia   . Gout    Past Surgical History  Procedure Laterality Date  . Hernia repair  1982  . Hernia repair    . Amputation  05/02/2011    Procedure: AMPUTATION DIGIT;  Surgeon: Kathryne Hitch;  Location: [facility] OR;  Service: Orthopedics;  Laterality: Left;  left great toe   No family history on file. History  Substance Use Topics  . Smoking status: Former Games developer  . Smokeless tobacco: Not on file  . Alcohol Use: No    Review of Systems  Constitutional: Negative for fever, activity change, appetite change and fatigue.  HENT: Negative for congestion, facial swelling, rhinorrhea and  trouble swallowing.   Eyes: Negative for photophobia and pain.  Respiratory: Negative for cough, chest tightness and shortness of breath.   Cardiovascular: Negative for chest pain and leg swelling.  Gastrointestinal: Negative for nausea, vomiting, abdominal pain, diarrhea and constipation.  Endocrine: Negative for polydipsia and polyuria.  Genitourinary: Negative for dysuria, urgency, decreased urine volume and difficulty urinating.  Musculoskeletal: Negative for back pain and gait problem.  Skin: Negative for color change, rash and wound.  Allergic/Immunologic: Negative for immunocompromised state.  Neurological: Negative for dizziness, facial asymmetry, speech difficulty, weakness, numbness and headaches.  Psychiatric/Behavioral: Negative for confusion, decreased concentration and agitation.    Allergies  Iohexol  Home Medications   No current outpatient prescriptions on file. BP 113/65  Pulse 95  Temp(Src) 99.6 F (37.6 C) (Oral)  Resp 20  Ht 5' 8.9" (1.75 m)  Wt 201 lb (91.173 kg)  BMI 29.77 kg/m2  SpO2 93% Physical Exam  Constitutional: He is oriented to person, place, and time. He appears well-developed and well-nourished. No distress.  HENT:  Head: Normocephalic and atraumatic.  Mouth/Throat: No oropharyngeal exudate.  Eyes: Pupils are equal, round, and reactive to light.  Neck: Normal range of motion. Neck supple.  Cardiovascular: Normal rate, regular rhythm and normal heart sounds.  Exam reveals no gallop and no friction rub.   No murmur heard. Pulmonary/Chest: Effort normal and breath sounds normal. No respiratory distress. He has no wheezes. He has  no rales.  Abdominal: Soft. Bowel sounds are normal. He exhibits no distension and no mass. There is no tenderness. There is no rebound and no guarding.  Musculoskeletal: Normal range of motion. He exhibits no edema and no tenderness.  Neurological: He is alert and oriented to person, place, and time.  Skin: Skin is  warm and dry.  Psychiatric: He has a normal mood and affect.    ED Course  Procedures (including critical care time) Labs Review Labs Reviewed  RESPIRATORY VIRUS PANEL - Abnormal; Notable for the following:    Influenza A DETECTED (*)    Influenza A H3 DETECTED (*)    All other components within normal limits  CBC WITH DIFFERENTIAL - Abnormal; Notable for the following:    WBC 12.1 (*)    RBC 4.06 (*)    Hemoglobin 12.2 (*)    HCT 35.2 (*)    RDW 16.8 (*)    Platelets 124 (*)    Neutrophils Relative % 87 (*)    Lymphocytes Relative 7 (*)    Neutro Abs 10.6 (*)    All other components within normal limits  PROTIME-INR - Abnormal; Notable for the following:    Prothrombin Time 20.3 (*)    INR 1.79 (*)    All other components within normal limits  BASIC METABOLIC PANEL - Abnormal; Notable for the following:    BUN 33 (*)    Creatinine, Ser 1.80 (*)    GFR calc non Af Amer 31 (*)    GFR calc Af Amer 36 (*)    All other components within normal limits  URINALYSIS, ROUTINE W REFLEX MICROSCOPIC - Abnormal; Notable for the following:    Hgb urine dipstick SMALL (*)    Protein, ur 30 (*)    All other components within normal limits  CBC - Abnormal; Notable for the following:    RBC 3.90 (*)    Hemoglobin 11.7 (*)    HCT 33.7 (*)    RDW 16.7 (*)    Platelets 99 (*)    All other components within normal limits  BASIC METABOLIC PANEL - Abnormal; Notable for the following:    BUN 33 (*)    Creatinine, Ser 1.87 (*)    Calcium 8.0 (*)    GFR calc non Af Amer 30 (*)    GFR calc Af Amer 35 (*)    All other components within normal limits  INFLUENZA PANEL BY PCR (TYPE A & B, H1N1) - Abnormal; Notable for the following:    Influenza A By PCR POSITIVE (*)    All other components within normal limits  HEPARIN LEVEL (UNFRACTIONATED) - Abnormal; Notable for the following:    Heparin Unfractionated 1.02 (*)    All other components within normal limits  PROTIME-INR - Abnormal; Notable  for the following:    Prothrombin Time 21.2 (*)    INR 1.90 (*)    All other components within normal limits  CBC - Abnormal; Notable for the following:    RBC 3.89 (*)    Hemoglobin 11.7 (*)    HCT 33.6 (*)    RDW 16.4 (*)    Platelets 92 (*)    All other components within normal limits  PROTIME-INR - Abnormal; Notable for the following:    Prothrombin Time 16.1 (*)    All other components within normal limits  CULTURE, BLOOD (ROUTINE X 2)  CULTURE, BLOOD (ROUTINE X 2)  URINE MICROSCOPIC-ADD ON  CBC  PROTIME-INR  Imaging Review Dg Chest Port 1 View  06/18/2013   CLINICAL DATA:  Possible aspiration  EXAM: PORTABLE CHEST - 1 VIEW  COMPARISON:  06/17/2013  FINDINGS: Perihilar and bibasilar vascular congestion and interstitial prominence suspicious for early edema pattern. Heart is enlarged. No effusion or pneumothorax. Trachea midline. Atherosclerosis of the aorta.  IMPRESSION: Perihilar and basilar developing edema.   Electronically Signed   By: Ruel Favors M.D.   On: 06/18/2013 23:22    EKG Interpretation    Date/Time:    Ventricular Rate:    PR Interval:    QRS Duration:   QT Interval:    QTC Calculation:   R Axis:     Text Interpretation:             Date: 06/20/2013  Rate: 109  Rhythm: sinus tachycardia  QRS Axis: normal  Intervals: normal  ST/T Wave abnormalities: nonspecific ST/T changes  Conduction Disutrbances:none  Narrative Interpretation:   Old EKG Reviewed: unchanged    MDM   1. Influenza-like illness   2. Osteomyelitis of ankle or foot, left, acute   3. Debility   4. Influenza A   5. VTE (venous thromboembolism)   6. Zenker diverticulum    Pt is a 80 y.o. male with Pmhx as above who presents with fall at home. He states he was in bathroom and "wilted down" in the bathroom (slid down wall).  Denies head injury, LOC, but was unable to get back up for several hours and was on the floor until family came.  He has no acute complaints, Febrile,  but VS otherwise stable, pt in NAD GCS 15 w/o focal neuro findings. He states he has had several days of fatigue, mild cough.  11:24 PM Pt's daughters have arrived, state he has been around someone with flu-like symptoms recently.  He also states he has known osteo in his L second toe. XR shows mild osseous irregularity of distal tip of digit.  WBC elevated, CXR unremarkable. Likely osteo at second toe L foot on XR.  R foot XR nml.  I have clinical concern for influenza-like illness, but cannot r/o osteomyelitis as cause of symptoms. Pt will be admitted to [facility] as pt displaying weakness at home with difficulty ambulating.       Shanna Cisco, MD 06/20/13 [phone]

## 2013-06-17 NOTE — ED Notes (Signed)
Pt tolerated po intake.  

## 2013-06-17 NOTE — ED Notes (Signed)
Pt returned from xray

## 2013-06-17 NOTE — ED Notes (Signed)
MD Docherty notified pt has temperature of 102.51F.

## 2013-06-18 ENCOUNTER — Inpatient Hospital Stay (HOSPITAL_COMMUNITY): Payer: Medicare HMO

## 2013-06-18 DIAGNOSIS — J111 Influenza due to unidentified influenza virus with other respiratory manifestations: Principal | ICD-10-CM

## 2013-06-18 DIAGNOSIS — R509 Fever, unspecified: Secondary | ICD-10-CM | POA: Insufficient documentation

## 2013-06-18 DIAGNOSIS — M86179 Other acute osteomyelitis, unspecified ankle and foot: Secondary | ICD-10-CM

## 2013-06-18 DIAGNOSIS — J101 Influenza due to other identified influenza virus with other respiratory manifestations: Secondary | ICD-10-CM | POA: Diagnosis present

## 2013-06-18 LAB — CBC
HCT: 33.7 % — ABNORMAL LOW (ref 39.0–52.0)
HEMOGLOBIN: 11.7 g/dL — AB (ref 13.0–17.0)
MCH: 30 pg (ref 26.0–34.0)
MCHC: 34.7 g/dL (ref 30.0–36.0)
MCV: 86.4 fL (ref 78.0–100.0)
PLATELETS: 99 10*3/uL — AB (ref 150–400)
RBC: 3.9 MIL/uL — AB (ref 4.22–5.81)
RDW: 16.7 % — ABNORMAL HIGH (ref 11.5–15.5)
WBC: 7.7 10*3/uL (ref 4.0–10.5)

## 2013-06-18 LAB — RESPIRATORY VIRUS PANEL
Adenovirus: NOT DETECTED
INFLUENZA A H1: NOT DETECTED
INFLUENZA A: DETECTED — AB
INFLUENZA B 1: NOT DETECTED
Influenza A H3: DETECTED — AB
Metapneumovirus: NOT DETECTED
Parainfluenza 1: NOT DETECTED
Parainfluenza 2: NOT DETECTED
Parainfluenza 3: NOT DETECTED
RESPIRATORY SYNCYTIAL VIRUS A: NOT DETECTED
Respiratory Syncytial Virus B: NOT DETECTED
Rhinovirus: NOT DETECTED

## 2013-06-18 LAB — BASIC METABOLIC PANEL
BUN: 33 mg/dL — ABNORMAL HIGH (ref 6–23)
CO2: 25 meq/L (ref 19–32)
Calcium: 8 mg/dL — ABNORMAL LOW (ref 8.4–10.5)
Chloride: 99 mEq/L (ref 96–112)
Creatinine, Ser: 1.87 mg/dL — ABNORMAL HIGH (ref 0.50–1.35)
GFR calc Af Amer: 35 mL/min — ABNORMAL LOW (ref >90)
GFR calc non Af Amer: 30 mL/min — ABNORMAL LOW (ref >90)
GLUCOSE: 97 mg/dL (ref 70–99)
Potassium: 4.3 mEq/L (ref 3.7–5.3)
Sodium: 140 mEq/L (ref 137–147)

## 2013-06-18 LAB — PROTIME-INR
INR: 1.9 — ABNORMAL HIGH (ref 0.00–1.49)
PROTHROMBIN TIME: 21.2 s — AB (ref 11.6–15.2)

## 2013-06-18 LAB — INFLUENZA PANEL BY PCR (TYPE A & B)
H1N1 flu by pcr: NOT DETECTED
INFLAPCR: POSITIVE — AB
Influenza B By PCR: NEGATIVE

## 2013-06-18 LAB — HEPARIN LEVEL (UNFRACTIONATED): Heparin Unfractionated: 1.02 IU/mL — ABNORMAL HIGH (ref 0.30–0.70)

## 2013-06-18 MED ORDER — ONDANSETRON HCL 4 MG/2ML IJ SOLN
4.0000 mg | Freq: Four times a day (QID) | INTRAMUSCULAR | Status: DC | PRN
  Administered 2013-06-18: 4 mg via INTRAVENOUS
  Filled 2013-06-18: qty 2

## 2013-06-18 MED ORDER — WARFARIN SODIUM 5 MG PO TABS
5.0000 mg | ORAL_TABLET | Freq: Once | ORAL | Status: AC
  Administered 2013-06-18: 5 mg via ORAL
  Filled 2013-06-18: qty 1

## 2013-06-18 MED ORDER — TAMSULOSIN HCL 0.4 MG PO CAPS
0.4000 mg | ORAL_CAPSULE | Freq: Every day | ORAL | Status: DC
  Administered 2013-06-18 – 2013-06-20 (×2): 0.4 mg via ORAL
  Filled 2013-06-18 (×5): qty 1

## 2013-06-18 MED ORDER — WARFARIN - PHARMACIST DOSING INPATIENT: Freq: Every day | Status: DC

## 2013-06-18 MED ORDER — PREDNISONE 10 MG PO TABS
10.0000 mg | ORAL_TABLET | Freq: Every day | ORAL | Status: DC
  Administered 2013-06-18 – 2013-06-21 (×3): 10 mg via ORAL
  Filled 2013-06-18 (×5): qty 1

## 2013-06-18 MED ORDER — METHOTREXATE 2.5 MG PO TABS: 7.5000 mg | ORAL_TABLET | ORAL | Status: DC

## 2013-06-18 MED ORDER — ALLOPURINOL 150 MG HALF TABLET
150.0000 mg | ORAL_TABLET | Freq: Every day | ORAL | Status: DC
  Administered 2013-06-18 – 2013-06-21 (×3): 150 mg via ORAL
  Filled 2013-06-18 (×4): qty 1

## 2013-06-18 MED ORDER — ACETAMINOPHEN 325 MG PO TABS
650.0000 mg | ORAL_TABLET | Freq: Four times a day (QID) | ORAL | Status: DC | PRN
  Administered 2013-06-18: 650 mg via ORAL
  Filled 2013-06-18: qty 2

## 2013-06-18 MED ORDER — OSELTAMIVIR PHOSPHATE 30 MG PO CAPS
30.0000 mg | ORAL_CAPSULE | Freq: Two times a day (BID) | ORAL | Status: DC
  Administered 2013-06-18 – 2013-06-21 (×7): 30 mg via ORAL
  Filled 2013-06-18 (×9): qty 1

## 2013-06-18 MED ORDER — HEPARIN (PORCINE) IN NACL 100-0.45 UNIT/ML-% IJ SOLN
1000.0000 [IU]/h | INTRAMUSCULAR | Status: DC
  Administered 2013-06-18: 1250 [IU]/h via INTRAVENOUS
  Filled 2013-06-18 (×2): qty 250

## 2013-06-18 MED ORDER — METHOTREXATE 2.5 MG PO TABS
7.5000 mg | ORAL_TABLET | ORAL | Status: DC
  Administered 2013-06-18: 7.5 mg via ORAL
  Filled 2013-06-18: qty 3

## 2013-06-18 MED ORDER — ALBUTEROL SULFATE (2.5 MG/3ML) 0.083% IN NEBU
6.0000 mL | INHALATION_SOLUTION | Freq: Four times a day (QID) | RESPIRATORY_TRACT | Status: DC | PRN
  Administered 2013-06-18: 6 mL via RESPIRATORY_TRACT
  Filled 2013-06-18 (×2): qty 3

## 2013-06-18 MED ORDER — CALCIUM CARBONATE-VITAMIN D 500-200 MG-UNIT PO TABS
1.0000 | ORAL_TABLET | Freq: Every day | ORAL | Status: DC
  Administered 2013-06-18: 1 via ORAL
  Filled 2013-06-18 (×2): qty 1

## 2013-06-18 MED ORDER — OSELTAMIVIR PHOSPHATE 75 MG PO CAPS: 75.0000 mg | ORAL_CAPSULE | Freq: Two times a day (BID) | ORAL | Status: DC

## 2013-06-18 MED ORDER — ACETAMINOPHEN 650 MG RE SUPP
650.0000 mg | RECTAL | Status: DC | PRN
  Administered 2013-06-18: 650 mg via RECTAL
  Filled 2013-06-18: qty 1

## 2013-06-18 MED ORDER — FUROSEMIDE 20 MG PO TABS
20.0000 mg | ORAL_TABLET | Freq: Every day | ORAL | Status: DC
  Administered 2013-06-18: 20 mg via ORAL
  Filled 2013-06-18: qty 1

## 2013-06-18 MED ORDER — OSELTAMIVIR PHOSPHATE 75 MG PO CAPS
75.0000 mg | ORAL_CAPSULE | Freq: Once | ORAL | Status: AC
  Administered 2013-06-18: 75 mg via ORAL
  Filled 2013-06-18: qty 1

## 2013-06-18 NOTE — Progress Notes (Signed)
Patient admitted after midnight.  Chart reviewed. Patient examined. Respiratory virus panel pending. Continue empiric tamiflu. Discussed with Dr. Doneen Poisson.  Cancel left second toe amputation scheduled for tomorrow. Needs to recover from infection first.  Resume coumadin for DVT. PT consult.  Crista Curb, M.D. [facility] [phone]

## 2013-06-18 NOTE — Progress Notes (Signed)
ANTICOAGULATION CONSULT NOTE - Initial Consult  Pharmacy Consult for Heparin Indication: atrial fibrillation  Allergies  Allergen Reactions  . Iohexol      Code: RASH, Desc: PT STATES HE HAD A REACTION TO IV DYE 06/29/06/RM, Onset Date: [id number]     Patient Measurements: Height: 5' 8.9" (175 cm) Weight: 201 lb (91.173 kg) IBW/kg (Calculated) : 70.47 Heparin Dosing Weight: 85 kg  Vital Signs: Temp: 99.5 F (37.5 C) (01/26 0030) Temp src: Oral (01/26 0030) BP: 121/76 mmHg (01/26 0100) Pulse Rate: 90 (01/26 0100)  Labs:  Recent Labs  06/17/13 2115 06/17/13 2116  HGB 12.2*  --   HCT 35.2*  --   PLT 124*  --   LABPROT 20.3*  --   INR 1.79*  --   CREATININE  --  1.80*    Estimated Creatinine Clearance: 30.4 ml/min (by C-G formula based on Cr of 1.8).   Medical History: Past Medical History  Diagnosis Date  . Hypertension   . Chronic kidney disease   . PAD (peripheral artery disease)   . Rheumatoid arthritis(714.0)   . CKD (chronic kidney disease), stage III   . Anemia   . Gout     Medications:  Albuterol  Allopurinol  Oscal-D  Lasix  Methotrexate  Prednisone  Flomax   Coumadin 5 mg alt with 2.5 mg daily (lasy dose 2.5 mg 1/24)  Assessment: 80 yo male with h/o Aifb, subtherapeutic INR, for heparin  Goal of Therapy:  Heparin level 0.3-0.7 Monitor platelets by anticoagulation protocol: Yes   Plan:  Start heparin 1250 units/hr Check heparin level in 8 hours.  Deloris Mittag, Gary Fleet 06/18/2013,1:22 AM

## 2013-06-18 NOTE — Progress Notes (Signed)
ANTICOAGULATION CONSULT NOTE - Initial Consult  Pharmacy Consult for Coumadin  Indication: atrial fibrillation  Allergies  Allergen Reactions  . Iohexol      Code: RASH, Desc: PT STATES HE HAD A REACTION TO IV DYE 06/29/06/RM, Onset Date: [id number]     Patient Measurements: Height: 5' 8.9" (175 cm) Weight: 201 lb (91.173 kg) IBW/kg (Calculated) : 70.47 Heparin Dosing Weight: 85 kg  Vital Signs: Temp: 99.7 F (37.6 C) (01/26 0611) Temp src: Oral (01/26 0611) BP: 129/70 mmHg (01/26 0611) Pulse Rate: 88 (01/26 0611)  Labs:  Recent Labs  06/17/13 2115 06/17/13 2116 06/18/13 0545 06/18/13 1100  HGB 12.2*  --  11.7*  --   HCT 35.2*  --  33.7*  --   PLT 124*  --  99*  --   LABPROT 20.3*  --   --  21.2*  INR 1.79*  --   --  1.90*  HEPARINUNFRC  --   --   --  1.02*  CREATININE  --  1.80* 1.87*  --     Estimated Creatinine Clearance: 29.3 ml/min (by C-G formula based on Cr of 1.87).   Medical History: Past Medical History  Diagnosis Date  . Hypertension   . Chronic kidney disease   . PAD (peripheral artery disease)   . Rheumatoid arthritis(714.0)   . CKD (chronic kidney disease), stage III   . Anemia   . Gout     Medications:  Albuterol  Allopurinol  Oscal-D  Lasix  Methotrexate  Prednisone  Flomax   Coumadin 5 mg alt with 2.5 mg daily (lasy dose 2.5 mg 1/24)  Assessment: 80 yo male with h/o Aifb, subtherapeutic INR at 1.9. Was on heparin drip in preparation for a toe amputation which has now been rescheduled. Patient is to re-start warfarin. All other labs as above. No bleeding noted. Patient reports no signs of bleeding.   Goal of Therapy:  INR 2-3   Monitor platelets by anticoagulation protocol: Yes   Plan:  1) Stop heparin 2) Coumadin 5 mg x 1 dose tonight  3) F/u daily INR  4) Monitor CBC and s/s of bleeding   Vinnie Level, PharmD.  Clinical Pharmacist Pager [phone]

## 2013-06-18 NOTE — H&P (Signed)
[facility] History and Physical  [name] [name] BXU:[id number] DOB: [birth year] DOA: 06/17/2013  Referring physician: EDP PCP: [name], MD   Chief Complaint: Cough, Fever   HPI: [name] [name] is a 80 y.o. male who presents to the ED after an un witnessed "fall" in the bathroom.  Patient basically states he had weak legs and slid down to the floor, uninjured in fall, no LOC.  Because he was not feeling well, he was on the floor for 3 hours until his son got back.  He has positive sick contact with his son who has influenza.  He also has known osteomyelitis of his toe and is expected to have amputation on Tuesday.  Review of Systems: Systems reviewed.  As above, otherwise negative  Past Medical History  Diagnosis Date  . Hypertension   . Chronic kidney disease   . PAD (peripheral artery disease)   . Rheumatoid arthritis(714.0)   . CKD (chronic kidney disease), stage III   . Anemia   . Gout    Past Surgical History  Procedure Laterality Date  . Hernia repair  1982  . Hernia repair    . Amputation  05/02/2011    Procedure: AMPUTATION DIGIT;  Surgeon: Kathryne Hitch;  Location: [facility] OR;  Service: Orthopedics;  Laterality: Left;  left great toe   Social History:  reports that he has quit smoking. He does not have any smokeless tobacco history on file. He reports that he does not drink alcohol or use illicit drugs.  Allergies  Allergen Reactions  . Iohexol      Code: RASH, Desc: PT STATES HE HAD A REACTION TO IV DYE 06/29/06/RM, Onset Date: [id number]     No family history on file.   Prior to Admission medications   Medication Sig Start Date End Date Taking? Authorizing Provider  albuterol (PROVENTIL HFA;VENTOLIN HFA) 108 (90 BASE) MCG/ACT inhaler Inhale 2 puffs into the lungs every 6 (six) hours as needed for wheezing. 05/05/11 06/17/13 Yes Marianne L York, PA-C  allopurinol (ZYLOPRIM) 300 MG tablet Take 150 mg by mouth daily.     Yes Historical Provider, MD   calcium-vitamin D (OSCAL WITH D) 500-200 MG-UNIT per tablet Take 1 tablet by mouth daily.    Yes Historical Provider, MD  furosemide (LASIX) 20 MG tablet Take 20 mg by mouth daily.     Yes Historical Provider, MD  Glucosamine 500 MG CAPS Take 1 capsule by mouth daily.     Yes Historical Provider, MD  methotrexate (RHEUMATREX) 2.5 MG tablet Take 7.5 mg by mouth once a week. Caution:Chemotherapy. Protect from light.   Yes Historical Provider, MD  predniSONE (DELTASONE) 10 MG tablet Take 10 mg by mouth daily with breakfast.   Yes Historical Provider, MD  Pseudoeph-CPM-DM-APAP (TYLENOL COLD PO) Take 2 capsules by mouth daily as needed (congestion).   Yes Historical Provider, MD  tamsulosin (FLOMAX) 0.4 MG CAPS capsule Take 0.4 mg by mouth at bedtime.   Yes Historical Provider, MD  warfarin (COUMADIN) 5 MG tablet Take 5 mg by mouth daily. Takes 2.5 mg alternating with 5 mg tablets.   Yes Historical Provider, MD   Physical Exam: Filed Vitals:   06/18/13 0100  BP: 121/76  Pulse: 90  Temp:   Resp: 17    BP 121/76  Pulse 90  Temp(Src) 99.5 F (37.5 C) (Oral)  Resp 17  Ht 5' 8.9" (1.75 m)  SpO2 99%  General Appearance:    Alert, oriented, no distress, appears  stated age  Head:    Normocephalic, atraumatic  Eyes:    PERRL, EOMI, sclera non-icteric        Nose:   Nares without drainage or epistaxis. Mucosa, turbinates normal  Throat:   Moist mucous membranes. Oropharynx without erythema or exudate.  Neck:   Supple. No carotid bruits.  No thyromegaly.  No lymphadenopathy.   Back:     No CVA tenderness, no spinal tenderness  Lungs:     Clear to auscultation bilaterally, without wheezes, rhonchi or rales  Chest wall:    No tenderness to palpitation  Heart:    Regular rate and rhythm without murmurs, gallops, rubs  Abdomen:     Soft, non-tender, nondistended, normal bowel sounds, no organomegaly  Genitalia:    deferred  Rectal:    deferred  Extremities:   No clubbing, cyanosis or edema.   Pulses:   2+ and symmetric all extremities  Skin:   Ulceration of 2nd toe on L foot tip, no surrounding erythema, edema, no other evidence of surrounding cellulitis, no drainage.  Lymph nodes:   Cervical, supraclavicular, and axillary nodes normal  Neurologic:   CNII-XII intact. Normal strength, sensation and reflexes      throughout    Labs on Admission:  Basic Metabolic Panel:  Recent Labs Lab 06/17/13 2116  NA 137  K 4.4  CL 97  CO2 25  GLUCOSE 92  BUN 33*  CREATININE 1.80*  CALCIUM 8.4   Liver Function Tests: No results found for this basename: AST, ALT, ALKPHOS, BILITOT, PROT, ALBUMIN,  in the last 168 hours No results found for this basename: LIPASE, AMYLASE,  in the last 168 hours No results found for this basename: AMMONIA,  in the last 168 hours CBC:  Recent Labs Lab 06/17/13 2115  WBC 12.1*  NEUTROABS 10.6*  HGB 12.2*  HCT 35.2*  MCV 86.7  PLT 124*   Cardiac Enzymes: No results found for this basename: CKTOTAL, CKMB, CKMBINDEX, TROPONINI,  in the last 168 hours  BNP (last 3 results) No results found for this basename: PROBNP,  in the last 8760 hours CBG: No results found for this basename: GLUCAP,  in the last 168 hours  Radiological Exams on Admission: Dg Chest 2 View  06/17/2013   CLINICAL DATA:  Hypertension, peripheral artery disease, fall.  EXAM: CHEST  2 VIEW  COMPARISON:  05/03/2011  FINDINGS: Cardiomediastinal contours are similar to prior, upper normal to mildly prominent. Mild aortic tortuosity. No confluent airspace opacity, pleural effusion, or pneumothorax. Limited osseous evaluation due to technique. Chronic changes of the left femoral neck. Degenerative changes of the spine.  IMPRESSION: Mildly prominent cardiomediastinal contours, similar to prior. No acute process identified.   Electronically Signed   By: Jearld Lesch M.D.   On: 06/17/2013 23:03   Dg Foot 2 Views Left  06/17/2013   CLINICAL DATA:  Diabetic ulcers, open wound distal  second digit.  EXAM: LEFT FOOT - 2 VIEW  COMPARISON:  04/30/2011  FINDINGS: Interval resection at the first metatarsal-phalangeal joint. Presumed resection at the tip of the second digit distal phalanx. Mild irregularity at the right second digit distal phalanx margin and mild overlying soft tissue irregularity. No displaced acute fracture or dislocation. Atherosclerotic vascular calcifications and posterior soft tissue calcifications. Plantar calcaneal enthesopathy.  IMPRESSION: Resection of the first digit at the metatarsophalangeal joint and the distal aspect of the distal phalanx second digit. There is mild osseous irregularity at the tip of the distal second digit. Consider  MRI follow-up to evaluate for early/mild osteomyelitis.   Electronically Signed   By: Jearld Lesch M.D.   On: 06/17/2013 23:01   Dg Foot 2 Views Right  06/17/2013   CLINICAL DATA:  Diabetic, distal toe soreness  EXAM: RIGHT FOOT - 2 VIEW  COMPARISON:  Contralateral foot of this same date and on 04/30/2011  FINDINGS: Hallux valgus angulation. Lisfranc joint intact. No displaced fracture or dislocation. No overt destructive/ erosive change. Atherosclerotic vascular and soft tissue calcifications posteriorly. Mild flattening of the plantar arch. Calcaneal along the plantar surface.  IMPRESSION: No acute osseous finding identified by radiograph. MRI recommended if concern for osteomyelitis persists.   Electronically Signed   By: Jearld Lesch M.D.   On: 06/17/2013 22:53    EKG: Independently reviewed.  Assessment/Plan Principal Problem:   Influenza-like illness Active Problems:   Osteomyelitis of ankle or foot, left, acute   Fever   1. ILI - clinical history and presentation suspicious for viral URI / flu.  His exam findings are not at all impressive for cellulitis or suggestive of spread from his toe site of osteomyelitis.  Will monitor him closely and if he begins to worsen will add ABx but at this time will just put  patient on tamiflu pending PCR. 2. Osteomyelitis of left 2nd toe - patient scheduled for surgery on Tuesday, holding coumadin, putting him on heparin gtt instead.    Code Status: Full Code  Family Communication: Family at bedside Disposition Plan:    Time spent: 50 min  Brunilda Eble M. [facility] Pager [phone]  If 7AM-7PM, please contact the day team taking care of the patient [email or web address] Password [email or web address] 06/18/2013, 1:19 AM

## 2013-06-18 NOTE — Progress Notes (Signed)
Patient having a hard time swallowing food/liquid and pills today, vomiting a large amount of food after dinner tonight.  Vitals taken, temp 102.7.  Dr. Lendell Caprice aware, new orders for aspirations precautions and a SLP eval for tomorrow, and to crush meds when possible.  Tylenol given and tolerated swallowing well, suction at bedside set up.  Patient suctioned for minimal secretions. Nasal cannula O2 in place.  Head of bed placed in upright position.  Nursing will continue to monitor.

## 2013-06-18 NOTE — Progress Notes (Signed)
UR completed. Ernst Cumpston RN CCM Case Mgmt 

## 2013-06-18 NOTE — Evaluation (Addendum)
Physical Therapy Evaluation Patient Details Name: [name] [name] MRN: [id number] DOB: December 22, 1920 Today's Date: 06/18/2013 Time: 1350-1407 PT Time Calculation (min): 17 min  PT Assessment / Plan / Recommendation History of Present Illness  [name] [name] is a 80 y.o. male who presents to the ED after an un witnessed "fall" in the bathroom.  Patient basically states he had weak legs and slid down to the floor, uninjured in fall, no LOC.  Because he was not feeling well, he was on the floor for 3 hours until his son got back. Respiratory virus panel is pending; possible influenza. Planned Lt second toe amputation cancelled at this time per last MD note.   Clinical Impression  Pt adm due to the above. Presents with limitations in functional mobility secondary to decreased safety awareness, balance deficits and pain in Lt foot from 2nd toe osteomyelitis. Pt to benefit from skilled acute PT to address deficits indicated below (see PT problem list). Pt very motivated to return home. 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 Pt will require 24/ (A) for safety; pt is a fall risk. Will recommend HHPT upon acute D/C.     PT Assessment  Patient needs continued PT services    Follow Up Recommendations  Home health PT;Supervision/Assistance - 24 hour    Does the patient have the potential to tolerate intense rehabilitation      Barriers to Discharge Decreased caregiver support pt is primary caregiver for his wife    Equipment Recommendations  None recommended by PT    Recommendations for Other Services OT consult   Frequency Min 4X/week    Precautions / Restrictions Precautions Precautions: Fall Precaution Comments: recent fall  Restrictions Weight Bearing Restrictions: No Other Position/Activity Restrictions: encouraged pt to ambulate through Lt heel to minimize WB through Rt second metatarsal    Pertinent Vitals/Pain 4/10 in Lt foot; patient repositioned for comfort       Mobility  Bed  Mobility Overal bed mobility: Modified Independent General bed mobility comments: pt required increased time to return to supine position with HOB flat; no physcial (A) required Transfers Overall transfer level: Needs assistance Equipment used: Ambulation equipment used;Rolling walker (2 wheeled) Transfers: Sit to/from Stand Sit to Stand: Min guard General transfer comment: min guard to steady with transfers and cues for hand placement/sequencing with RW; pt unsteady due to pain in Rt foot  Ambulation/Gait Ambulation/Gait assistance: Min guard Ambulation Distance (Feet): 20 Feet Assistive device: Rolling walker (2 wheeled) Gait Pattern/deviations: Decreased stance time - right;Decreased step length - left;Trunk flexed;Wide base of support Gait velocity: decreased due to pain  Gait velocity interpretation: Below normal speed for age/gender General Gait Details: pt ambulates with antalgic like gt due to pain in Lt foot; cues for upright posture and management of RW: pt unsteady with ambulation; is a fall risk          PT Diagnosis: Abnormality of gait;Acute pain  PT Problem List: Decreased strength;Decreased activity tolerance;Decreased balance;Decreased mobility;Decreased knowledge of use of DME;Decreased safety awareness;Pain;Impaired sensation PT Treatment Interventions: DME instruction;Gait training;Stair training;Functional mobility training;Therapeutic activities;Therapeutic exercise;Balance training;Neuromuscular re-education;Patient/family education     PT Goals(Current goals can be found in the care plan section) Acute Rehab PT Goals Patient Stated Goal: to not have surgery and go home tomorrow  PT Goal Formulation: With patient Time For Goal Achievement: 07/02/13 Potential to Achieve Goals: Good  Visit Information  Last PT Received On: 06/18/13 Assistance Needed: +1 History of Present Illness: [name] [name] is a  80 y.o. male who presents to the ED after an un witnessed  "fall" in the bathroom.  Patient basically states he had weak legs and slid down to the floor, uninjured in fall, no LOC.  Because he was not feeling well, he was on the floor for 3 hours until his son got back.       Prior Functioning  Home Living Family/patient expects to be discharged to:: Private residence Living Arrangements: Spouse/significant other;Children Available Help at Discharge: Family;Available PRN/intermittently Type of Home: House Home Access: Stairs to enter [employer] of Steps: 3 Entrance Stairs-Rails: None Home Layout: One level Home Equipment: Walker - 2 wheels;Cane - single point;Shower seat Additional Comments: pt has daughter who lives with him and his wife but she works during the day; pt was primary caregiver for wife  Prior Function Level of Independence: Independent with assistive device(s) Comments: pt ambulates with cane and is caregiver for his wife  Communication Communication: No difficulties    Cognition  Cognition Arousal/Alertness: Awake/alert Behavior During Therapy: WFL for tasks assessed/performed Overall Cognitive Status: Within Functional Limits for tasks assessed    Extremity/Trunk Assessment Upper Extremity Assessment Upper Extremity Assessment: Defer to OT evaluation Lower Extremity Assessment Lower Extremity Assessment: RLE deficits/detail (c/o numbness in bil LEs ) RLE Deficits / Details: foot limited by pain  RLE Sensation: history of peripheral neuropathy Cervical / Trunk Assessment Cervical / Trunk Assessment: Kyphotic   Balance Balance Overall balance assessment: History of Falls;Needs assistance Sitting-balance support: Feet supported;No upper extremity supported Sitting balance-Leahy Scale: Good Standing balance support: During functional activity;Bilateral upper extremity supported Standing balance-Leahy Scale: Fair  End of Session PT - End of Session Equipment Utilized During Treatment: Gait belt Activity  Tolerance: Patient tolerated treatment well Patient left: in bed;with bed alarm set;with call bell/phone within reach Nurse Communication: Mobility status;Precautions  GP     Donell Sievert, [place] [phone] 06/18/2013, 2:42 PM

## 2013-06-19 ENCOUNTER — Encounter (HOSPITAL_COMMUNITY): Admission: RE | Payer: Self-pay | Source: Ambulatory Visit

## 2013-06-19 ENCOUNTER — Inpatient Hospital Stay (HOSPITAL_COMMUNITY): Payer: Medicare HMO

## 2013-06-19 ENCOUNTER — Ambulatory Visit (HOSPITAL_COMMUNITY): Admission: RE | Admit: 2013-06-19 | Payer: Medicare HMO | Source: Ambulatory Visit | Admitting: Orthopaedic Surgery

## 2013-06-19 DIAGNOSIS — I749 Embolism and thrombosis of unspecified artery: Secondary | ICD-10-CM

## 2013-06-19 DIAGNOSIS — R5381 Other malaise: Secondary | ICD-10-CM | POA: Diagnosis present

## 2013-06-19 DIAGNOSIS — K225 Diverticulum of esophagus, acquired: Secondary | ICD-10-CM

## 2013-06-19 LAB — CBC
HCT: 33.6 % — ABNORMAL LOW (ref 39.0–52.0)
HEMOGLOBIN: 11.7 g/dL — AB (ref 13.0–17.0)
MCH: 30.1 pg (ref 26.0–34.0)
MCHC: 34.8 g/dL (ref 30.0–36.0)
MCV: 86.4 fL (ref 78.0–100.0)
Platelets: 92 10*3/uL — ABNORMAL LOW (ref 150–400)
RBC: 3.89 MIL/uL — AB (ref 4.22–5.81)
RDW: 16.4 % — ABNORMAL HIGH (ref 11.5–15.5)
WBC: 7.4 10*3/uL (ref 4.0–10.5)

## 2013-06-19 LAB — PROTIME-INR
INR: 1.32 (ref 0.00–1.49)
Prothrombin Time: 16.1 seconds — ABNORMAL HIGH (ref 11.6–15.2)

## 2013-06-19 SURGERY — AMPUTATION DIGIT
Anesthesia: Choice | Laterality: Left

## 2013-06-19 MED ORDER — WARFARIN SODIUM 7.5 MG PO TABS
7.5000 mg | ORAL_TABLET | Freq: Once | ORAL | Status: AC
Start: 2013-06-19 — End: 2013-06-19
  Administered 2013-06-19: 7.5 mg via ORAL
  Filled 2013-06-19: qty 1

## 2013-06-19 MED ORDER — DEXTROSE-NACL 5-0.45 % IV SOLN
INTRAVENOUS | Status: DC
  Administered 2013-06-19: 09:00:00 via INTRAVENOUS

## 2013-06-19 NOTE — Progress Notes (Signed)
[facility] PROGRESS NOTE  [name] [name] FTD:[id number] DOB: [birth year] DOA: 06/17/2013 PCP: Stephens Shire, MD  Assessment/Plan:  Principal Problem:   Influenza A continue tamiflu and supportive care Active Problems:   Osteomyelitis of ankle or foot, left, acute: surgery postponed until flu resolved. Dr. Melvenia Beam office to call pt when he is discharged   Zenker diverticulum: see ST note   Debility: home PT   Code Status:  full Family Communication:   Disposition Plan:  home  Consultants:    Procedures:     Antibiotics:    HPI/Subjective: Feels a little better. Per grandson, vomited all night  Objective: Filed Vitals:   06/19/13 0627  BP: 110/65  Pulse: 102  Temp: 99 F (37.2 C)  Resp: 18    Intake/Output Summary (Last 24 hours) at 06/19/13 1641 Last data filed at 06/19/13 0606  Gross per 24 hour  Intake    590 ml  Output      0 ml  Net    590 ml   Filed Weights   06/18/13 0120  Weight: 91.173 kg (201 lb)    Exam:   General:  Nontoxic in chair  Cardiovascular: RRR without MGR  Respiratory: CTA without WRR  Abdomen: S, nt, nd  Ext: no CCE  Basic Metabolic Panel:  Recent Labs Lab 06/17/13 2116 06/18/13 0545  NA 137 140  K 4.4 4.3  CL 97 99  CO2 25 25  GLUCOSE 92 97  BUN 33* 33*  CREATININE 1.80* 1.87*  CALCIUM 8.4 8.0*   Liver Function Tests: No results found for this basename: AST, ALT, ALKPHOS, BILITOT, PROT, ALBUMIN,  in the last 168 hours No results found for this basename: LIPASE, AMYLASE,  in the last 168 hours No results found for this basename: AMMONIA,  in the last 168 hours CBC:  Recent Labs Lab 06/17/13 2115 06/18/13 0545 06/19/13 0550  WBC 12.1* 7.7 7.4  NEUTROABS 10.6*  --   --   HGB 12.2* 11.7* 11.7*  HCT 35.2* 33.7* 33.6*  MCV 86.7 86.4 86.4  PLT 124* 99* 92*   Cardiac Enzymes: No results found for this basename: CKTOTAL, CKMB, CKMBINDEX, TROPONINI,  in the last 168 hours BNP (last 3  results) No results found for this basename: PROBNP,  in the last 8760 hours CBG: No results found for this basename: GLUCAP,  in the last 168 hours  Recent Results (from the past 240 hour(s))  CULTURE, BLOOD (ROUTINE X 2)     Status: None   Collection Time    06/17/13 10:45 PM      Result Value Range Status   Specimen Description BLOOD RIGHT ARM   Final   Special Requests BOTTLES DRAWN AEROBIC AND ANAEROBIC 10CC EACH   Final   Culture  Setup Time     Final   Value: 06/18/2013 03:41     Performed at [facility]   Culture     Final   Value:        BLOOD CULTURE RECEIVED NO GROWTH TO DATE CULTURE WILL BE HELD FOR 5 DAYS BEFORE ISSUING A FINAL NEGATIVE REPORT     Performed at [facility]   Report Status PENDING   Incomplete  CULTURE, BLOOD (ROUTINE X 2)     Status: None   Collection Time    06/17/13 10:50 PM      Result Value Range Status   Specimen Description BLOOD RIGHT HAND   Final   Special Requests BOTTLES DRAWN  AEROBIC ONLY 10CC   Final   Culture  Setup Time     Final   Value: 06/18/2013 03:41     Performed at [facility]   Culture     Final   Value:        BLOOD CULTURE RECEIVED NO GROWTH TO DATE CULTURE WILL BE HELD FOR 5 DAYS BEFORE ISSUING A FINAL NEGATIVE REPORT     Performed at [facility]   Report Status PENDING   Incomplete  RESPIRATORY VIRUS PANEL     Status: Abnormal   Collection Time    06/18/13  1:54 AM      Result Value Range Status   Source - [facility] NASAL SWAB   Corrected   Comment: CORRECTED ON 01/26 AT 1951: PREVIOUSLY REPORTED AS NASAL SWAB   Respiratory Syncytial Virus A NOT DETECTED   Final   Respiratory Syncytial Virus B NOT DETECTED   Final   Influenza A DETECTED (*)  Final   Influenza B NOT DETECTED   Final   Parainfluenza 1 NOT DETECTED   Final   Parainfluenza 2 NOT DETECTED   Final   Parainfluenza 3 NOT DETECTED   Final   Metapneumovirus NOT DETECTED   Final   Rhinovirus NOT DETECTED   Final   Adenovirus NOT  DETECTED   Final   Influenza A H1 NOT DETECTED   Final   Influenza A H3 DETECTED (*)  Final   Comment: (NOTE)           Normal Reference Range for each Analyte: NOT DETECTED     Testing performed using the Luminex xTAG Respiratory Viral Panel test     kit.     This test was developed and its performance characteristics determined     by [facility]. It has not been cleared or approved by the [place]     Food and Drug Administration. This test is used for clinical purposes.     It should not be regarded as investigational or for research. This     laboratory is certified under the [place] (CLIA) as qualified to perform high complexity     clinical laboratory testing.     Performed at [facility]     Studies: Dg Chest 2 View  06/17/2013   CLINICAL DATA:  Hypertension, peripheral artery disease, fall.  EXAM: CHEST  2 VIEW  COMPARISON:  05/03/2011  FINDINGS: Cardiomediastinal contours are similar to prior, upper normal to mildly prominent. Mild aortic tortuosity. No confluent airspace opacity, pleural effusion, or pneumothorax. Limited osseous evaluation due to technique. Chronic changes of the left femoral neck. Degenerative changes of the spine.  IMPRESSION: Mildly prominent cardiomediastinal contours, similar to prior. No acute process identified.   Electronically Signed   By: Carlos Levering M.D.   On: 06/17/2013 23:03   Dg Chest Port 1 View  06/18/2013   CLINICAL DATA:  Possible aspiration  EXAM: PORTABLE CHEST - 1 VIEW  COMPARISON:  06/17/2013  FINDINGS: Perihilar and bibasilar vascular congestion and interstitial prominence suspicious for early edema pattern. Heart is enlarged. No effusion or pneumothorax. Trachea midline. Atherosclerosis of the aorta.  IMPRESSION: Perihilar and basilar developing edema.   Electronically Signed   By: Daryll Brod M.D.   On: 06/18/2013 23:22   Dg Foot 2 Views Left  06/17/2013   CLINICAL DATA:   Diabetic ulcers, open wound distal second digit.  EXAM: LEFT FOOT -  2 VIEW  COMPARISON:  04/30/2011  FINDINGS: Interval resection at the first metatarsal-phalangeal joint. Presumed resection at the tip of the second digit distal phalanx. Mild irregularity at the right second digit distal phalanx margin and mild overlying soft tissue irregularity. No displaced acute fracture or dislocation. Atherosclerotic vascular calcifications and posterior soft tissue calcifications. Plantar calcaneal enthesopathy.  IMPRESSION: Resection of the first digit at the metatarsophalangeal joint and the distal aspect of the distal phalanx second digit. There is mild osseous irregularity at the tip of the distal second digit. Consider MRI follow-up to evaluate for early/mild osteomyelitis.   Electronically Signed   By: Carlos Levering M.D.   On: 06/17/2013 23:01   Dg Foot 2 Views Right  06/17/2013   CLINICAL DATA:  Diabetic, distal toe soreness  EXAM: RIGHT FOOT - 2 VIEW  COMPARISON:  Contralateral foot of this same date and on 04/30/2011  FINDINGS: Hallux valgus angulation. Lisfranc joint intact. No displaced fracture or dislocation. No overt destructive/ erosive change. Atherosclerotic vascular and soft tissue calcifications posteriorly. Mild flattening of the plantar arch. Calcaneal along the plantar surface.  IMPRESSION: No acute osseous finding identified by radiograph. MRI recommended if concern for osteomyelitis persists.   Electronically Signed   By: Carlos Levering M.D.   On: 06/17/2013 22:53    Scheduled Meds: . allopurinol  150 mg Oral Daily  . oseltamivir  30 mg Oral BID  . predniSONE  10 mg Oral Q breakfast  . tamsulosin  0.4 mg Oral QHS  . warfarin  7.5 mg Oral ONCE-1800  . Warfarin - Pharmacist Dosing Inpatient   Does not apply q1800   Continuous Infusions: . dextrose 5 % and 0.45% NaCl 50 mL/hr at 06/19/13 0919    Time spent: 25 minutes  [facility] Pager [phone]. If  7PM-7AM, please contact night-coverage at [email or web address], password [facility] 06/19/2013, 4:41 PM  LOS: 2 days

## 2013-06-19 NOTE — Progress Notes (Signed)
ANTICOAGULATION CONSULT NOTE - Follow up Consult  Pharmacy Consult for Coumadin  Indication: atrial fibrillation  Allergies  Allergen Reactions  . Iohexol      Code: RASH, Desc: PT STATES HE HAD A REACTION TO IV DYE 06/29/06/RM, Onset Date: [id number]     Patient Measurements: Height: 5' 8.9" (175 cm) Weight: 201 lb (91.173 kg) IBW/kg (Calculated) : 70.47 Heparin Dosing Weight: 85 kg  Vital Signs: Temp: 99 F (37.2 C) (01/27 0627) BP: 110/65 mmHg (01/27 0627) Pulse Rate: 102 (01/27 0627)  Labs:  Recent Labs  06/17/13 2115 06/17/13 2116 06/18/13 0545 06/18/13 1100 06/19/13 0550  HGB 12.2*  --  11.7*  --  11.7*  HCT 35.2*  --  33.7*  --  33.6*  PLT 124*  --  99*  --  92*  LABPROT 20.3*  --   --  21.2* 16.1*  INR 1.79*  --   --  1.90* 1.32  HEPARINUNFRC  --   --   --  1.02*  --   CREATININE  --  1.80* 1.87*  --   --     Estimated Creatinine Clearance: 29.3 ml/min (by C-G formula based on Cr of 1.87).   Medical History: Past Medical History  Diagnosis Date  . Hypertension   . Chronic kidney disease   . PAD (peripheral artery disease)   . Rheumatoid arthritis(714.0)   . CKD (chronic kidney disease), stage III   . Anemia   . Gout     Medications:  Albuterol  Allopurinol  Oscal-D  Lasix  Methotrexate  Prednisone  Flomax   Coumadin 5 mg alt with 2.5 mg daily (lasy dose 2.5 mg 1/24)  Assessment: 80 yo male with h/o Aifb, INR subtherapeutic and trended down to 1.32. He is having trouble swallowing pills and vomited large amout of food after dinner last night. Now NPO except meds. Was on heparin drip in preparation for a toe amputation which has now been rescheduled. H/H low but stable. Platelets have trended down to 92. No bleeding noted.   Goal of Therapy:  INR 2-3   Monitor platelets by anticoagulation protocol: Yes   Plan:  1) Coumadin 7.5 mg x 1 dose tonight  2) F/u daily INR  3) Monitor CBC and s/s of bleeding  4) F/u if patient is tolerating meds today.    Vinnie Level, PharmD.  Clinical Pharmacist Pager [phone]

## 2013-06-19 NOTE — Progress Notes (Signed)
Physical Therapy Treatment Patient Details Name: MCKINLEY OLHEISER MRN: [id number] DOB: 01/18/1921 Today's Date: 06/19/2013 Time: 0300-9233 PT Time Calculation (min): 28 min  PT Assessment / Plan / Recommendation  History of Present Illness TRADARIUS REINWALD is a 80 y.o. male who presents to the ED after an un witnessed "fall" in the bathroom.  Patient basically states he had weak legs and slid down to the floor, uninjured in fall, no LOC.  Because he was not feeling well, he was on the floor for 3 hours until his son got back.   PT Comments   Very motivated to get up and take steps toward dc'ing home; Moves quite inefficiently, slowly; Patient needs to practice stairs next session.     Follow Up Recommendations  Home health PT;Supervision/Assistance - 24 hour     Does the patient have the potential to tolerate intense rehabilitation     Barriers to Discharge        Equipment Recommendations  None recommended by PT    Recommendations for Other Services OT consult  Frequency Min 4X/week   Progress towards PT Goals Progress towards PT goals: Progressing toward goals  Plan Current plan remains appropriate    Precautions / Restrictions Precautions Precautions: Fall Precaution Comments: recent fall  Restrictions Weight Bearing Restrictions: No Other Position/Activity Restrictions: encouraged pt to ambulate through Rt heel to minimize WB through Rt second metatarsal    Pertinent Vitals/Pain no apparent distress     Mobility  Bed Mobility Overal bed mobility: Modified Independent General bed mobility comments: pt required increased time to reciprocally scoot to EOB; very inefficient movement Transfers Overall transfer level: Needs assistance Equipment used: Ambulation equipment used;Rolling walker (2 wheeled) Transfers: Sit to/from Stand Sit to Stand: Min guard General transfer comment: min guard to steady with transfers and cues for hand placement/sequencing with RW; pt unsteady  due to pain in Rt foot  Ambulation/Gait Ambulation/Gait assistance: Min guard Ambulation Distance (Feet): 25 Feet Assistive device: Rolling walker (2 wheeled) Gait Pattern/deviations: Decreased stride length;Trunk flexed Gait velocity: decreased due to pain  General Gait Details: pt ambulates with antalgic like gt due to pain in Lt foot; cues for upright posture and management of RW: pt unsteady with ambulation; is a fall risk     Exercises     PT Diagnosis:    PT Problem List:   PT Treatment Interventions:     PT Goals (current goals can now be found in the care plan section) Acute Rehab PT Goals Patient Stated Goal: to not have surgery and go home tomorrow  PT Goal Formulation: With patient Time For Goal Achievement: 07/02/13 Potential to Achieve Goals: Good  Visit Information  Last PT Received On: 06/19/13 Assistance Needed: +1 History of Present Illness: GLENNON KOPKO is a 80 y.o. male who presents to the ED after an un witnessed "fall" in the bathroom.  Patient basically states he had weak legs and slid down to the floor, uninjured in fall, no LOC.  Because he was not feeling well, he was on the floor for 3 hours until his son got back.    Subjective Data  Subjective: Wanting to get up; has not been OOB all day Patient Stated Goal: to not have surgery and go home tomorrow    Cognition  Cognition Arousal/Alertness: Awake/alert Behavior During Therapy: WFL for tasks assessed/performed Overall Cognitive Status: Within Functional Limits for tasks assessed    Balance  Balance Overall balance assessment: History of Falls;Needs assistance Sitting-balance support:  Feet supported Sitting balance-Leahy Scale: Good Standing balance support: Bilateral upper extremity supported Standing balance-Leahy Scale: Fair  End of Session PT - End of Session Equipment Utilized During Treatment: Gait belt Activity Tolerance: Patient tolerated treatment well Patient left: in bed;with bed  alarm set;with call bell/phone within reach Nurse Communication: Mobility status;Precautions   GP     Van Clines [facility] [place], [place] [phone]  06/19/2013, 7:26 PM

## 2013-06-19 NOTE — Procedures (Signed)
Objective Swallowing Evaluation: Modified Barium Swallowing Study  Patient Details  Name: [name] [name] MRN: [id number] Date of Birth: Jan 05, 1921  Today's Date: 06/19/2013 Time: 2353-6144 SLP Time Calculation (min): 40 min  Past Medical History:  Past Medical History  Diagnosis Date  . Hypertension   . Chronic kidney disease   . PAD (peripheral artery disease)   . Rheumatoid arthritis(714.0)   . CKD (chronic kidney disease), stage III   . Anemia   . Gout    Past Surgical History:  Past Surgical History  Procedure Laterality Date  . Hernia repair  1982  . Hernia repair    . Amputation  05/02/2011    Procedure: AMPUTATION DIGIT;  Surgeon: Kathryne Hitch;  Location: [facility];  Service: Orthopedics;  Laterality: Left;  left great toe   HPI:  [name] [name] is a 80 y.o. male who presents to the ED after an un witnessed "fall" in the bathroom.  Patient basically states he had weak legs and slid down to the floor, uninjured in fall, no LOC.  Because he was not feeling well, he was on the floor for 3 hours until his son got back.Nsg noted difficulty swallowing with vomiting after his meal.  Pt reports h/o esophageal stretchng by GI doctor.     Assessment / Plan / Recommendation Clinical Impression  Dysphagia Diagnosis: Mild oral phase dysphagia;Moderate cervical esophageal phase dysphagia;Mild pharyngeal phase dysphagia Clinical impression: Pt with Zenker's Diverticulum with moderate residue at cricopharyngeus. This diverticulum filled with successive sips, however cued dry swallows reduces residue. Pt also with mild to moderate residue at valleculae due to spill over of oral residue into the pharynx. Again, dry swallows reduced this. Pt with reduced mastication as lower dentures were not present during study. Pt is at moderate risk for aspiration due to Zenker's diverticulum and moderate esophageal residue in distal  esophagus.     Treatment Recommendation       Diet  Recommendation Dysphagia 3 (Mechanical Soft);Thin liquid   Liquid Administration via: Cup;Straw Medication Administration: Crushed with puree Supervision: Patient able to self feed Compensations: Slow rate;Small sips/bites;Multiple dry swallows after each bite/sip Postural Changes and/or Swallow Maneuvers: Out of bed for meals;Upright 30-60 min after meal    Other  Recommendations Recommended Consults: Consider GI evaluation   Follow Up Recommendations  None    Frequency and Duration min 2x/week  2 weeks   Pertinent Vitals/Pain No pain reported    SLP Swallow Goals  see care plan   General Date of Onset: 06/19/13 HPI: [name] [name] is a 80 y.o. male who presents to the ED after an un witnessed "fall" in the bathroom.  Patient basically states he had weak legs and slid down to the floor, uninjured in fall, no LOC.  Because he was not feeling well, he was on the floor for 3 hours until his son got back.Nsg noted difficulty swallowing with vomiting after his meal.  Pt reports h/o esophageal stretchng by GI doctor. Type of Study: Modified Barium Swallowing Study Reason for Referral: Objectively evaluate swallowing function Diet Prior to this Study: Regular;Thin liquids Temperature Spikes Noted: Yes Respiratory Status: Nasal cannula History of Recent Intubation: No Behavior/Cognition: Alert;Cooperative Oral Cavity - Dentition: Dentures, top;Edentulous Oral Motor / Sensory Function: Within functional limits Self-Feeding Abilities: Able to feed self Patient Positioning: Upright in chair Baseline Vocal Quality: Clear Volitional Cough: Strong Volitional Swallow: Able to elicit Anatomy: Within functional limits    Reason for Referral Objectively evaluate swallowing function  Oral Phase Oral Preparation/Oral Phase Oral Phase: WFL Oral - Thin Oral - Thin Teaspoon: Not tested Oral - Thin Cup: Within functional limits Oral - Thin Straw: Within functional limits Oral - Solids Oral  - Puree: Within functional limits Oral - Mechanical Soft: Impaired mastication;Lingual/palatal residue   Pharyngeal Phase Pharyngeal Phase Pharyngeal Phase: Impaired Pharyngeal - Thin Pharyngeal - Thin Cup: Premature spillage to valleculae;Pharyngeal residue - cp segment;Pharyngeal residue - posterior pharnyx;Pharyngeal residue - valleculae Pharyngeal - Thin Straw: Premature spillage to valleculae;Pharyngeal residue - cp segment;Pharyngeal residue - valleculae Pharyngeal - Solids Pharyngeal - Puree: Pharyngeal residue - valleculae;Pharyngeal residue - posterior pharnyx;Pharyngeal residue - cp segment Pharyngeal - Mechanical Soft: Pharyngeal residue - valleculae;Pharyngeal residue - posterior pharnyx;Pharyngeal residue - cp segment Pharyngeal Phase - Comment Pharyngeal Comment: Zenker's Diverticulum present, cued dry swallows help clear pharyngeal residue. Zenker's filled with successive thin liquid sips.  Cervical Esophageal Phase    GO    Cervical Esophageal Phase Cervical Esophageal Phase: Impaired Cervical Esophageal Phase - Thin Thin Teaspoon: Not tested Thin Cup: Reduced cricopharyngeal relaxation;Prominent cricopharyngeal segment Thin Straw: Reduced cricopharyngeal relaxation;Prominent cricopharyngeal segment Thin Syringe: Not tested Cervical Esophageal Phase - Solids Puree: Reduced cricopharyngeal relaxation;Prominent cricopharyngeal segment Mechanical Soft: Reduced cricopharyngeal relaxation;Prominent cricopharyngeal segment Cervical Esophageal Phase - Comment Cervical Esophageal Comment: Zenker's Diverticulum         Renly Roots, Radene Journey 06/19/2013, 2:32 PM

## 2013-06-19 NOTE — Evaluation (Signed)
Clinical/Bedside Swallow Evaluation Patient Details  Name: [name] [name] MRN: [id number] Date of Birth: Feb 23, 1921  Today's Date: 06/19/2013 Time: 0920-0944 SLP Time Calculation (min): 24 min  Past Medical History:  Past Medical History  Diagnosis Date  . Hypertension   . Chronic kidney disease   . PAD (peripheral artery disease)   . Rheumatoid arthritis(714.0)   . CKD (chronic kidney disease), stage III   . Anemia   . Gout    Past Surgical History:  Past Surgical History  Procedure Laterality Date  . Hernia repair  1982  . Hernia repair    . Amputation  05/02/2011    Procedure: AMPUTATION DIGIT;  Surgeon: Kathryne Hitch;  Location: [facility];  Service: Orthopedics;  Laterality: Left;  left great toe   HPI:   [name] [name] is a 80 y.o. male who presents to the ED after an un witnessed "fall" in the bathroom.  Patient basically states he had weak legs and slid down to the floor, uninjured in fall, no LOC.  Because he was not feeling well, he was on the floor for 3 hours until his son got back Pt reports h/o esophagus "not working" and "food doesn't go into my stomach." He reports he's had this for 15 years. Nsg observed difficulty swallowing last night, with vomiting after meal. CXR:.Perihilar and bibasilar vascular congestion and interstitial   Assessment / Plan / Recommendation Clinical Impression  Overt s/s of aspiration greatest with thin liquids. Thin liquids resulted in inconsistent coughing, audible wet cervical respirations, indicating reduced airway protection. Pt also with h/o of esophageal dysfunction.  Recommend MBSS to r/o primary aspiration, due to esophageal dysfunction, pt may experience secondary aspiration.     Aspiration Risk  Mild    Diet Recommendation NPO except meds   Medication Administration: Crushed with puree    Other  Recommendations Recommended Consults: MBS   Follow Up Recommendations  Home health SLP    Frequency and Duration min 2x/week   1 week   Pertinent Vitals/Pain Pt reports ache in right abdomen, 3/10, nsg notified    SLP Swallow Goals  Defer until after MBSS this afternon   Swallow Study Prior Functional Status       General Date of Onset: 06/18/13 HPI:  [name] [name] is a 80 y.o. male who presents to the ED after an un witnessed "fall" in the bathroom.  Patient basically states he had weak legs and slid down to the floor, uninjured in fall, no LOC.  Because he was not feeling well, he was on the floor for 3 hours until his son got back Pt reports h/o esophagus "not working" and "food doesn't go into my stomach." He reports he's had this for 15 years. Nsg observed difficulty swallowing last night, with vomiting after meal. CXR:.Perihilar and bibasilar vascular congestion and interstitial Type of Study: Bedside swallow evaluation Previous Swallow Assessment: none in chart Diet Prior to this Study: Regular;Thin liquids Temperature Spikes Noted: Yes Respiratory Status: Nasal cannula History of Recent Intubation: No Behavior/Cognition: Alert;Cooperative;Pleasant mood Oral Cavity - Dentition: Dentures, top;Edentulous Self-Feeding Abilities: Needs assist Patient Positioning: Upright in bed Baseline Vocal Quality: Clear Volitional Cough: Strong Volitional Swallow: Able to elicit    Oral/Motor/Sensory Function Overall Oral Motor/Sensory Function: Appears within functional limits for tasks assessed   Ice Chips Ice chips: Not tested   Thin Liquid Thin Liquid: Impaired Presentation: Cup;Straw;Spoon Pharyngeal  Phase Impairments: Multiple swallows;Wet Vocal Quality;Cough - Delayed Other Comments: audible wet cervical respirations after  thin liquids    Nectar Thick Nectar Thick Liquid: Within functional limits Presentation: Spoon;Cup Other Comments: small boluses given   Honey Thick Honey Thick Liquid: Not tested   Puree Puree: Within functional limits   Solid   GO    Solid: Impaired Oral Phase Impairments:  Impaired mastication Pharyngeal Phase Impairments: Cough - Delayed       [name], [name] Journey 06/19/2013,9:59 AM

## 2013-06-20 DIAGNOSIS — I10 Essential (primary) hypertension: Secondary | ICD-10-CM

## 2013-06-20 DIAGNOSIS — N183 Chronic kidney disease, stage 3 unspecified: Secondary | ICD-10-CM

## 2013-06-20 LAB — BASIC METABOLIC PANEL
BUN: 34 mg/dL — ABNORMAL HIGH (ref 6–23)
CALCIUM: 7.2 mg/dL — AB (ref 8.4–10.5)
CHLORIDE: 100 meq/L (ref 96–112)
CO2: 23 mEq/L (ref 19–32)
Creatinine, Ser: 1.77 mg/dL — ABNORMAL HIGH (ref 0.50–1.35)
GFR calc Af Amer: 37 mL/min — ABNORMAL LOW (ref >90)
GFR calc non Af Amer: 32 mL/min — ABNORMAL LOW (ref >90)
Glucose, Bld: 94 mg/dL (ref 70–99)
POTASSIUM: 4.2 meq/L (ref 3.7–5.3)
SODIUM: 137 meq/L (ref 137–147)

## 2013-06-20 LAB — PROTIME-INR
INR: 1.59 — AB (ref 0.00–1.49)
Prothrombin Time: 18.5 seconds — ABNORMAL HIGH (ref 11.6–15.2)

## 2013-06-20 LAB — CBC
HCT: 30.7 % — ABNORMAL LOW (ref 39.0–52.0)
HEMOGLOBIN: 10.8 g/dL — AB (ref 13.0–17.0)
MCH: 29.9 pg (ref 26.0–34.0)
MCHC: 35.2 g/dL (ref 30.0–36.0)
MCV: 85 fL (ref 78.0–100.0)
Platelets: 73 10*3/uL — ABNORMAL LOW (ref 150–400)
RBC: 3.61 MIL/uL — AB (ref 4.22–5.81)
RDW: 16 % — AB (ref 11.5–15.5)
WBC: 5.7 10*3/uL (ref 4.0–10.5)

## 2013-06-20 MED ORDER — WARFARIN SODIUM 7.5 MG PO TABS
7.5000 mg | ORAL_TABLET | Freq: Once | ORAL | Status: AC
  Administered 2013-06-20: 7.5 mg via ORAL
  Filled 2013-06-20: qty 1

## 2013-06-20 NOTE — Progress Notes (Signed)
Physical Therapy Treatment Patient Details Name: [name] [name] MRN: [id number] DOB: 03-27-1921 Today's Date: 06/20/2013 Time: 1500-1549 (15 mins on commode, so 2 units) PT Time Calculation (min): 49 min  PT Assessment / Plan / Recommendation  History of Present Illness [name] [name] is a 80 y.o. male who presents to the ED after an un witnessed "fall" in the bathroom.  Patient basically states he had weak legs and slid down to the floor, uninjured in fall, no LOC.  Because he was not feeling well, he was on the floor for 3 hours until his son got back.   PT Comments   Making good progress, especially with activity tolerance and progressive amb; On track fo rdc home; continue to rec HHtherapy f/u  Follow Up Recommendations  Home health PT;Supervision/Assistance - 24 hour     Does the patient have the potential to tolerate intense rehabilitation     Barriers to Discharge        Equipment Recommendations  None recommended by PT    Recommendations for Other Services OT consult  Frequency Min 4X/week   Progress towards PT Goals Progress towards PT goals: Progressing toward goals  Plan Current plan remains appropriate    Precautions / Restrictions Precautions Precautions: Fall Precaution Comments: recent fall  Restrictions Weight Bearing Restrictions: No Other Position/Activity Restrictions: encouraged pt to ambulate through Rt heel to minimize WB through Rt second metatarsal    Pertinent Vitals/Pain no apparent distress     Mobility  Transfers Overall transfer level: Needs assistance Equipment used: Ambulation equipment used;Rolling walker (2 wheeled) Transfers: Sit to/from Stand Sit to Stand: Min guard General transfer comment: min guard to steady with transfers and cues for hand placement/sequencing with RW; pt unsteady due to pain in Rt foot  Ambulation/Gait Ambulation/Gait assistance: Min guard;Supervision Ambulation Distance (Feet): 250 Feet Assistive device:  Rolling walker (2 wheeled) Gait Pattern/deviations: Decreased stride length Gait velocity: decreased due to pain  General Gait Details: Minguard assist progressing to supervision; pt ambulates with antalgic like gt due to pain in Lt foot; cues for upright posture and management of RW: pt unsteady with ambulation; is a fall risk  Stairs: Yes Stairs assistance: Min guard Stair Management: Two rails;Step to pattern;Forwards Number of Stairs: 3 General stair comments: slow, but steady    Exercises     PT Diagnosis:    PT Problem List:   PT Treatment Interventions:     PT Goals (current goals can now be found in the care plan section) Acute Rehab PT Goals Patient Stated Goal: hopeful for home tomorrow PT Goal Formulation: With patient Time For Goal Achievement: 07/02/13 Potential to Achieve Goals: Good  Visit Information  Last PT Received On: 06/20/13 Assistance Needed: +1 History of Present Illness: [name] [name] is a 80 y.o. male who presents to the ED after an un witnessed "fall" in the bathroom.  Patient basically states he had weak legs and slid down to the floor, uninjured in fall, no LOC.  Because he was not feeling well, he was on the floor for 3 hours until his son got back.    Subjective Data  Subjective: wanting to get home Patient Stated Goal: hopeful for home tomorrow   Cognition  Cognition Arousal/Alertness: Awake/alert Behavior During Therapy: WFL for tasks assessed/performed Overall Cognitive Status: Within Functional Limits for tasks assessed    Balance     End of Session PT - End of Session Activity Tolerance: Patient tolerated treatment well Patient left: in bed;with  bed alarm set;with call bell/phone within reach Nurse Communication: Mobility status;Precautions   GP     Van Clines [facility] [place] [phone]  06/20/2013, 5:14 PM

## 2013-06-20 NOTE — Care Management Note (Signed)
CARE MANAGEMENT NOTE 06/20/2013  Patient:  [name] [name], [name] [name]   Account Number:  [id number]  Date Initiated:  06/18/2013  Documentation initiated by:  [facility]  Subjective/Objective Assessment:   positive for flu, right toe amputation surgery scheduled Tues was cancelled     Action/Plan:   Case Manager spoke with patient concerning need for Home Health. Choice offered. Patient states he will also have family support. CM contacted [facility] liasion with [facility].   Anticipated DC Date:  06/21/2013   Anticipated DC Plan:  HOME W HOME HEALTH SERVICES      DC Planning Services  CM consult      [facility] Choice  HOME HEALTH   Choice offered to / List presented to:  C-1 Patient   DME arranged  NA        HH arranged  HH-1 RN  HH-2 PT  HH-3 OT  HH-4 NURSE'S AIDE  HH-6 SOCIAL WORKER      Status of service:  Completed, signed off Medicare Important Message given?   (If response is "NO", the following Medicare IM given date fields will be blank) Date Medicare IM given:   Date Additional Medicare IM given:    Discharge Disposition:  HOME W HOME HEALTH SERVICES

## 2013-06-20 NOTE — Progress Notes (Signed)
Speech Language Pathology Treatment: Dysphagia  Patient Details Name: DEREL MCGLASSON MRN: [id number] DOB: April 20, 1921 Today's Date: 06/20/2013 Time: 8377-9396 SLP Time Calculation (min): 31 min  Assessment / Plan / Recommendation Clinical Impression  Patient appears to be tolerating Dys 3 diet with thin liquids without difficulty. Pt. States "I realized the importance of sitting upright when eating."  Pt. Required min. Verbal cues to use double swallow strategy to clear laryngeal residue (and to clear the Zinker's diverticulum).  Patient reports he had esophageal dilitation several years ago via Dr. Watt Climes.   No further ST f/u needed at this time for his primary esophageal dysphagia.    HPI HPI: FRANDY BASNETT is a 80 y.o. male who presents to the ED after an un witnessed "fall" in the bathroom.  Patient basically states he had weak legs and slid down to the floor, uninjured in fall, no LOC.  Because he was not feeling well, he was on the floor for 3 hours until his son got back.Nsg noted difficulty swallowing with vomiting after his meal.  Pt reports h/o esophageal stretchng by GI doctor.   Pertinent Vitals afebrile  SLP Plan  Discharge SLP treatment due to (comment);All goals met    Recommendations Diet recommendations: Dysphagia 3 (mechanical soft);Thin liquid Liquids provided via: Straw Medication Administration: Crushed with puree Supervision: Patient able to self feed Compensations: Slow rate;Small sips/bites;Multiple dry swallows after each bite/sip Postural Changes and/or Swallow Maneuvers: Out of bed for meals;Upright 30-60 min after meal              Oral Care Recommendations: Oral care BID Follow up Recommendations: None Plan: Discharge SLP treatment due to (comment);All goals met    GO     Quinn Axe T 06/20/2013, 4:31 PM

## 2013-06-20 NOTE — Progress Notes (Signed)
[facility] PROGRESS NOTE  [name] [name] FFM:[id number] DOB: [birth year] DOA: 06/17/2013 PCP: Stephens Shire, MD  Assessment/Plan: #1 influenza A Clinical improvement. Continue supportive care. Continue Tamiflu.  #2 osteomyelitis of the ankle of foot left Surgery has been postponed until patient's acute illness has resolved. Patient will need to followup with his orthopedic after discharge.  #3 history of DVT Coumadin has been resumed.  #4 chronic kidney disease stage III Stable.  #5 hypertension Stable. On Flomax.  Code Status: Full Family Communication: Updated patient no family at bedside. Disposition Plan: Home with home health when medically stable   Consultants:  None  Procedures:  Modified barium swallow 06/19/2013  Chest x-ray 06/18/2013  Antibiotics:  None  HPI/Subjective: No complaints.  Objective: Filed Vitals:   06/20/13 0548  BP: 113/65  Pulse: 95  Temp: 99.6 F (37.6 C)  Resp: 20    Intake/Output Summary (Last 24 hours) at 06/20/13 1029 Last data filed at 06/20/13 0100  Gross per 24 hour  Intake 794.17 ml  Output      0 ml  Net 794.17 ml   Filed Weights   06/18/13 0120  Weight: 91.173 kg (201 lb)    Exam:   General:  NAD  Cardiovascular: RRR  Respiratory: CTAB  Abdomen: Soft, nontender, nondistended, positive bowel sounds  Musculoskeletal: No C/C/E  Data Reviewed: Basic Metabolic Panel:  Recent Labs Lab 06/17/13 2116 06/18/13 0545 06/20/13 0801  NA 137 140 137  K 4.4 4.3 4.2  CL 97 99 100  CO2 $Re'25 25 23  'frD$ GLUCOSE 92 97 94  BUN 33* 33* 34*  CREATININE 1.80* 1.87* 1.77*  CALCIUM 8.4 8.0* 7.2*   Liver Function Tests: No results found for this basename: AST, ALT, ALKPHOS, BILITOT, PROT, ALBUMIN,  in the last 168 hours No results found for this basename: LIPASE, AMYLASE,  in the last 168 hours No results found for this basename: AMMONIA,  in the last 168 hours CBC:  Recent Labs Lab 06/17/13 2115  06/18/13 0545 06/19/13 0550 06/20/13 0645  WBC 12.1* 7.7 7.4 5.7  NEUTROABS 10.6*  --   --   --   HGB 12.2* 11.7* 11.7* 10.8*  HCT 35.2* 33.7* 33.6* 30.7*  MCV 86.7 86.4 86.4 85.0  PLT 124* 99* 92* 73*   Cardiac Enzymes: No results found for this basename: CKTOTAL, CKMB, CKMBINDEX, TROPONINI,  in the last 168 hours BNP (last 3 results) No results found for this basename: PROBNP,  in the last 8760 hours CBG: No results found for this basename: GLUCAP,  in the last 168 hours  Recent Results (from the past 240 hour(s))  CULTURE, BLOOD (ROUTINE X 2)     Status: None   Collection Time    06/17/13 10:45 PM      Result Value Range Status   Specimen Description BLOOD RIGHT ARM   Final   Special Requests BOTTLES DRAWN AEROBIC AND ANAEROBIC 10CC EACH   Final   Culture  Setup Time     Final   Value: 06/18/2013 03:41     Performed at [facility]   Culture     Final   Value:        BLOOD CULTURE RECEIVED NO GROWTH TO DATE CULTURE WILL BE HELD FOR 5 DAYS BEFORE ISSUING A FINAL NEGATIVE REPORT     Performed at [facility]   Report Status PENDING   Incomplete  CULTURE, BLOOD (ROUTINE X 2)     Status: None   Collection  Time    06/17/13 10:50 PM      Result Value Range Status   Specimen Description BLOOD RIGHT HAND   Final   Special Requests BOTTLES DRAWN AEROBIC ONLY 10CC   Final   Culture  Setup Time     Final   Value: 06/18/2013 03:41     Performed at [facility]   Culture     Final   Value:        BLOOD CULTURE RECEIVED NO GROWTH TO DATE CULTURE WILL BE HELD FOR 5 DAYS BEFORE ISSUING A FINAL NEGATIVE REPORT     Performed at [facility]   Report Status PENDING   Incomplete  RESPIRATORY VIRUS PANEL     Status: Abnormal   Collection Time    06/18/13  1:54 AM      Result Value Range Status   Source - [facility] NASAL SWAB   Corrected   Comment: CORRECTED ON 01/26 AT 1951: PREVIOUSLY REPORTED AS NASAL SWAB   Respiratory Syncytial Virus A NOT DETECTED    Final   Respiratory Syncytial Virus B NOT DETECTED   Final   Influenza A DETECTED (*)  Final   Influenza B NOT DETECTED   Final   Parainfluenza 1 NOT DETECTED   Final   Parainfluenza 2 NOT DETECTED   Final   Parainfluenza 3 NOT DETECTED   Final   Metapneumovirus NOT DETECTED   Final   Rhinovirus NOT DETECTED   Final   Adenovirus NOT DETECTED   Final   Influenza A H1 NOT DETECTED   Final   Influenza A H3 DETECTED (*)  Final   Comment: (NOTE)           Normal Reference Range for each Analyte: NOT DETECTED     Testing performed using the [employer] xTAG Respiratory Viral Panel test     kit.     This test was developed and its performance characteristics determined     by [facility]. It has not been cleared or approved by the [place]     Food and Drug Administration. This test is used for clinical purposes.     It should not be regarded as investigational or for research. This     laboratory is certified under the [place] (CLIA) as qualified to perform high complexity     clinical laboratory testing.     Performed at [facility]     Studies: Dg Chest Port 1 View  06/18/2013   CLINICAL DATA:  Possible aspiration  EXAM: PORTABLE CHEST - 1 VIEW  COMPARISON:  06/17/2013  FINDINGS: Perihilar and bibasilar vascular congestion and interstitial prominence suspicious for early edema pattern. Heart is enlarged. No effusion or pneumothorax. Trachea midline. Atherosclerosis of the aorta.  IMPRESSION: Perihilar and basilar developing edema.   Electronically Signed   By: Daryll Brod M.D.   On: 06/18/2013 23:22    Scheduled Meds: . allopurinol  150 mg Oral Daily  . oseltamivir  30 mg Oral BID  . predniSONE  10 mg Oral Q breakfast  . tamsulosin  0.4 mg Oral QHS  . Warfarin - Pharmacist Dosing Inpatient   Does not apply q1800   Continuous Infusions: . dextrose 5 % and 0.45% NaCl 50 mL/hr at 06/19/13 0919    Principal Problem:    Influenza A Active Problems:   Osteomyelitis of ankle or foot, left, acute   CKD (chronic kidney disease), stage  III   Zenker diverticulum   Debility    Time spent: 35 minutes    [facility] Pager [phone]. If 7PM-7AM, please contact night-coverage at [email or web address], password [facility] 06/20/2013, 10:29 AM  LOS: 3 days

## 2013-06-20 NOTE — Progress Notes (Signed)
ANTICOAGULATION CONSULT NOTE - Follow up Consult  Pharmacy Consult for Coumadin  Indication: atrial fibrillation/Chronic DVT   Allergies  Allergen Reactions  . Iohexol      Code: RASH, Desc: PT STATES HE HAD A REACTION TO IV DYE 06/29/06/RM, Onset Date: [id number]     Patient Measurements: Height: 5' 8.9" (175 cm) Weight: 201 lb (91.173 kg) IBW/kg (Calculated) : 70.47 Heparin Dosing Weight: 85 kg  Vital Signs: Temp: 99.6 F (37.6 C) (01/28 0548) BP: 113/65 mmHg (01/28 0548) Pulse Rate: 95 (01/28 0548)  Labs:  Recent Labs  06/17/13 2115 06/17/13 2116 06/18/13 0545 06/18/13 1100 06/19/13 0550 06/20/13 0645 06/20/13 0801  HGB 12.2*  --  11.7*  --  11.7* 10.8*  --   HCT 35.2*  --  33.7*  --  33.6* 30.7*  --   PLT 124*  --  99*  --  92* 73*  --   LABPROT 20.3*  --   --  21.2* 16.1* 18.5*  --   INR 1.79*  --   --  1.90* 1.32 1.59*  --   HEPARINUNFRC  --   --   --  1.02*  --   --   --   CREATININE  --  1.80* 1.87*  --   --   --  1.77*    Estimated Creatinine Clearance: 30.9 ml/min (by C-G formula based on Cr of 1.77).   Medical History: Past Medical History  Diagnosis Date  . Hypertension   . Chronic kidney disease   . PAD (peripheral artery disease)   . Rheumatoid arthritis(714.0)   . CKD (chronic kidney disease), stage III   . Anemia   . Gout     Medications:  Albuterol  Allopurinol  Oscal-D  Lasix  Methotrexate  Prednisone  Flomax   Coumadin 5 mg alt with 2.5 mg daily (lasy dose 2.5 mg 1/24)  Assessment: 80 yo male with h/o Aifb and chronic DVT on duplex (04/30/11), INR subtherapeutic but has trended up 1.32>>1.59. Patient now tolerating PO meds. H/H continues to trend down 10.8/30.7. Platelets have trended down from 124 at admission to 73. Discussed with MD who suggest monitoring CBC closely. Cannot r/o HIT since patient was on heparin till 26th but plt count has not dropped > 50% from BL. No s/s of bleeding noted.   Goal of Therapy:  INR 2-3   Monitor  platelets by anticoagulation protocol: Yes   Plan:  1) Coumadin 7.5 mg x 1 dose tonight  2) F/u daily INR  3) Monitor CBC and s/s of bleeding closely   Vinnie Level, PharmD.  Clinical Pharmacist Pager [phone]

## 2013-06-21 LAB — CBC
HCT: 29.3 % — ABNORMAL LOW (ref 39.0–52.0)
HEMOGLOBIN: 10.2 g/dL — AB (ref 13.0–17.0)
MCH: 29.8 pg (ref 26.0–34.0)
MCHC: 34.8 g/dL (ref 30.0–36.0)
MCV: 85.7 fL (ref 78.0–100.0)
PLATELETS: 76 10*3/uL — AB (ref 150–400)
RBC: 3.42 MIL/uL — ABNORMAL LOW (ref 4.22–5.81)
RDW: 16.4 % — ABNORMAL HIGH (ref 11.5–15.5)
WBC: 4.7 10*3/uL (ref 4.0–10.5)

## 2013-06-21 LAB — PROTIME-INR
INR: 1.59 — ABNORMAL HIGH (ref 0.00–1.49)
PROTHROMBIN TIME: 18.5 s — AB (ref 11.6–15.2)

## 2013-06-21 MED ORDER — OSELTAMIVIR PHOSPHATE 30 MG PO CAPS: 30.0000 mg | ORAL_CAPSULE | Freq: Two times a day (BID) | ORAL | Status: AC

## 2013-06-21 MED ORDER — WARFARIN SODIUM 7.5 MG PO TABS
7.5000 mg | ORAL_TABLET | Freq: Once | ORAL | Status: AC
  Administered 2013-06-21: 7.5 mg via ORAL
  Filled 2013-06-21: qty 1

## 2013-06-21 MED ORDER — WARFARIN SODIUM 5 MG PO TABS: 5.0000 mg | ORAL_TABLET | Freq: Every day | ORAL | Status: DC

## 2013-06-21 NOTE — Progress Notes (Signed)
ANTICOAGULATION CONSULT NOTE - Follow up Consult  Pharmacy Consult for Coumadin  Indication: atrial fibrillation/Chronic DVT   Allergies  Allergen Reactions  . Iohexol      Code: RASH, Desc: PT STATES HE HAD A REACTION TO IV DYE 06/29/06/RM, Onset Date: [id number]     Patient Measurements: Height: 5' 8.9" (175 cm) Weight: 201 lb (91.173 kg) IBW/kg (Calculated) : 70.47 Heparin Dosing Weight: 85 kg  Vital Signs: Temp: 98.9 F (37.2 C) (01/29 0626) BP: 112/70 mmHg (01/29 0626) Pulse Rate: 95 (01/29 0626)  Labs:  Recent Labs  06/18/13 1100  06/19/13 0550 06/20/13 0645 06/20/13 0801 06/21/13 0439  HGB  --   < > 11.7* 10.8*  --  10.2*  HCT  --   --  33.6* 30.7*  --  29.3*  PLT  --   --  92* 73*  --  76*  LABPROT 21.2*  --  16.1* 18.5*  --  18.5*  INR 1.90*  --  1.32 1.59*  --  1.59*  HEPARINUNFRC 1.02*  --   --   --   --   --   CREATININE  --   --   --   --  1.77*  --   < > = values in this interval not displayed.  Estimated Creatinine Clearance: 30.9 ml/min (by C-G formula based on Cr of 1.77).   Medical History: Past Medical History  Diagnosis Date  . Hypertension   . Chronic kidney disease   . PAD (peripheral artery disease)   . Rheumatoid arthritis(714.0)   . CKD (chronic kidney disease), stage III   . Anemia   . Gout     Medications:  Albuterol  Allopurinol  Oscal-D  Lasix  Methotrexate  Prednisone  Flomax   Coumadin 5 mg alt with 2.5 mg daily (lasy dose 2.5 mg 1/24)  Assessment: 80 yo male with h/o Aifb and chronic DVT on duplex (04/30/11), INR subtherapeutic but has trended up 1.32>>1.59. Patient now tolerating PO meds. H/H stable at 10.2/29.3. Platelet count has stabilized and seems to be trending back up. Plans for discharge today. Pt says his INR has been supratherapeutic ~ 4 on home dose. With that in mind along with age, I will repeat yesterday's dose and recommended to MD to send him home on home dose and follow up INR on Monday.   Goal of Therapy:   INR 2-3   Monitor platelets by anticoagulation protocol: Yes   Plan:  1) Coumadin 7.5 mg x 1 dose now.  2) F/u daily INR  3) Monitor CBC and s/s of bleeding closely   Vinnie Level, PharmD.  Clinical Pharmacist Pager [phone]

## 2013-06-21 NOTE — Progress Notes (Signed)
Patient d/c to home, IV removed.  Prescriptions given and instructions reviewed and given.

## 2013-06-21 NOTE — Discharge Summary (Signed)
Physician Discharge Summary  [name] [name] IEP:[id number] DOB: 23-Nov-1920 DOA: 06/17/2013  PCP: [name] Shire, MD  Admit date: 06/17/2013 Discharge date: 06/21/2013  Time spent: 65 minutes  Recommendations for Outpatient Follow-up:  1. Followup with [name] A, MD in 1 week. On followup [name] basic metabolic profile he to be done to followup on patient's electrolytes and renal function. Patient's influenza will need to be reassessed. 2. Patient is to followup at PCPs office on Monday, 06/25/2013 for PT/INR check. On day of discharge patient's INR was 1.59. 3. Patient was on [name] to followup with Dr. Ninfa [name] of orthopedics one week post discharge to reschedule his surgery.  Discharge Diagnoses:  Principal Problem:   Influenza [name] Active Problems:   Osteomyelitis of ankle or foot, left, acute   CKD (chronic kidney disease), stage III   Zenker diverticulum   Debility   Discharge Condition: Stable and improved.  Diet recommendation: Dysphagia 3 diet.  Filed Weights   06/18/13 0120  Weight: 91.173 kg (201 lb)    History of present illness:  [name] [name] is [name] 80 y.o. male who presents to the ED after an un witnessed "fall" in the bathroom. Patient basically states he had weak legs and slid down to the floor, uninjured in fall, no LOC. Because he was not feeling well, he was on the floor for 3 hours until his son got back.  He has positive sick contact with his son who has influenza. He also has known osteomyelitis of his toe and is expected to have amputation on Tuesday.  Review of Systems: Systems reviewed. As above, otherwise negative   Hospital Course:  #1 influenza [name]  Patient was admitted with generalized weakness. Chest x-ray which was done was negative for infiltrate. Patient was placed on IV fluids space on Tamiflu empirically and I influenza PCR panel was obtained. Influenza PCR panel came back positive for influenza [name]. Patient was placed on supportive care and continued on  Tamiflu. Patient improved clinically and will be discharged home on 2 more days of Tamiflu to complete [name] five-day course of therapy. Patient be discharged in stable and improved condition.  #2 osteomyelitis of the ankle of foot left  Prior to admission patient was scheduled for surgery for osteomyelitis of the left foot. Patient's case was discussed with the orthopedic surgeon Dr. Ninfa [name] who recommended that surgery be postponed until patient's acute illness has resolved. Patient will need to followup with his orthopedic surgeon after discharge.  #3 history of DVT  Initially on admission patient's Coumadin was held in anticipation for surgery however her surgery was postponed patient's Coumadin was resumed. Patient will be discharged on Coumadin and will need [name] PT INR checked on Monday, 06/25/2013 at his PCPs office. On day of discharge patient's INR was 1.59 and patient will followup with his orthopedic surgeon at which point in time will be decided as to when Coumadin needs to be re held. #4 chronic kidney disease stage III  Stable.  #5 hypertension  Stable. On Flomax.   Procedures: Modified barium swallow 06/19/2013  Chest x-ray 06/18/2013   Consultations:  None  Discharge Exam: Filed Vitals:   06/21/13 0626  BP: 112/70  Pulse: 95  Temp: 98.9 F (37.2 C)  Resp: 18    General: NAD Cardiovascular: RRR Respiratory: CTAB  Discharge Instructions      Discharge Orders   Future Appointments Provider Department Dept Phone   09/25/2013 2:15 PM Sinclair Grooms, MD [facility] [phone]  Future Orders Complete By Expires   Diet general  As directed    Scheduling Instructions:     Dysphagia 3 diet   Discharge instructions  As directed    Comments:     Follow up with Dr [name] [name] in 1-2 weeks. Follow up with [name] Shire, MD office on Monday 06/25/13 for coumadin check PT/INR Follow up with [name] A, MD in 1 week.   Increase activity slowly  As  directed        Medication List         albuterol 108 (90 BASE) MCG/ACT inhaler  Commonly known as:  PROVENTIL HFA;VENTOLIN HFA  Inhale 2 puffs into the lungs every 6 (six) hours as needed for wheezing.     allopurinol 300 MG tablet  Commonly known as:  ZYLOPRIM  Take 150 mg by mouth daily.     calcium-vitamin D 500-200 MG-UNIT per tablet  Commonly known as:  OSCAL WITH D  Take 1 tablet by mouth daily.     furosemide 20 MG tablet  Commonly known as:  LASIX  Take 20 mg by mouth daily.     Glucosamine 500 MG Caps  Take 1 capsule by mouth daily.     methotrexate 2.5 MG tablet  Commonly known as:  RHEUMATREX  Take 7.5 mg by mouth once [name] week. Caution:Chemotherapy. Protect from light.     oseltamivir 30 MG capsule  Commonly known as:  TAMIFLU  Take 1 capsule (30 mg total) by mouth 2 (two) times daily. Take for 2 days then stop.     predniSONE 10 MG tablet  Commonly known as:  DELTASONE  Take 10 mg by mouth daily with breakfast.     tamsulosin 0.4 MG Caps capsule  Commonly known as:  FLOMAX  Take 0.4 mg by mouth at bedtime.     TYLENOL COLD PO  Take 2 capsules by mouth daily as needed (congestion).     warfarin 5 MG tablet  Commonly known as:  COUMADIN  Take 1 tablet (5 mg total) by mouth daily. Takes 2.5 mg alternating with 5 mg tablets.  Start taking on:  06/22/2013       Allergies  Allergen Reactions  . Iohexol      Code: RASH, Desc: PT STATES HE HAD [name] REACTION TO IV DYE 06/29/06/RM, Onset Date: [id number]    Follow-up Information   Follow up with [name] A, MD. Schedule an appointment as soon as possible for [name] visit in 1 week.   Specialty:  Family Medicine   Contact information:   [place] [address] [phone]       Follow up with [name] Shire, MD On 06/25/2013. (f/u on Monday for coumadin check/PT/INR)    Specialty:  Family Medicine   Contact information:   [address] [place]  [address] [phone]       Follow up with Mcarthur Rossetti, MD. Schedule an appointment as soon as possible for [name] visit in 1 week. (to reschedule surgery)    Specialty:  Orthopedic Surgery   Contact information:   [place] [address] [phone]        The results of significant diagnostics from this hospitalization (including imaging, microbiology, ancillary and laboratory) are listed below for reference.    Significant Diagnostic Studies: Dg Chest 2 View  06/17/2013   CLINICAL DATA:  Hypertension, peripheral artery disease, fall.  EXAM: CHEST  2 VIEW  COMPARISON:  05/03/2011  FINDINGS: Cardiomediastinal contours are similar to prior, upper normal to mildly prominent. Mild aortic tortuosity. No confluent airspace opacity, pleural effusion, or pneumothorax. Limited osseous evaluation due to technique. Chronic changes of the left femoral neck. Degenerative changes of the spine.  IMPRESSION: Mildly prominent cardiomediastinal contours, similar to prior. No acute process identified.   Electronically Signed   By: Carlos Levering M.D.   On: 06/17/2013 23:03   Dg Cervical Spine Complete  06/04/2013   CLINICAL DATA:  Neck pain  EXAM: CERVICAL SPINE  4+ VIEWS  COMPARISON:  None.  FINDINGS: There is no evidence of acute fracture nor dislocation. Multilevel areas of severe disc space narrowing appreciated from the C3 through the C7 levels. There is also endplate hypertrophic spurring, subchondral sclerosis and areas of facet arthropathy. Areas of neuroforaminal narrowing identified on the left at the C3-4 C4-5 levels and on the right at the C6-7 C7-T1 levels. There is mild dextroscoliosis of the cervical spine.  IMPRESSION: Multilevel, multifactorial areas of spondylolysis. Further evaluation with cervical MRI is recommended. There is no evidence of acute osseous abnormalities.   Electronically Signed   By: Margaree Mackintosh M.D.   On: 06/04/2013 11:57   Dg Chest Port 1  View  06/18/2013   CLINICAL DATA:  Possible aspiration  EXAM: PORTABLE CHEST - 1 VIEW  COMPARISON:  06/17/2013  FINDINGS: Perihilar and bibasilar vascular congestion and interstitial prominence suspicious for early edema pattern. Heart is enlarged. No effusion or pneumothorax. Trachea midline. Atherosclerosis of the aorta.  IMPRESSION: Perihilar and basilar developing edema.   Electronically Signed   By: Daryll Brod M.D.   On: 06/18/2013 23:22   Dg Foot 2 Views Left  06/17/2013   CLINICAL DATA:  Diabetic ulcers, open wound distal second digit.  EXAM: LEFT FOOT - 2 VIEW  COMPARISON:  04/30/2011  FINDINGS: Interval resection at the first metatarsal-phalangeal joint. Presumed resection at the tip of the second digit distal phalanx. Mild irregularity at the right second digit distal phalanx margin and mild overlying soft tissue irregularity. No displaced acute fracture or dislocation. Atherosclerotic vascular calcifications and posterior soft tissue calcifications. Plantar calcaneal enthesopathy.  IMPRESSION: Resection of the first digit at the metatarsophalangeal joint and the distal aspect of the distal phalanx second digit. There is mild osseous irregularity at the tip of the distal second digit. Consider MRI follow-up to evaluate for early/mild osteomyelitis.   Electronically Signed   By: Carlos Levering M.D.   On: 06/17/2013 23:01   Dg Foot 2 Views Right  06/17/2013   CLINICAL DATA:  Diabetic, distal toe soreness  EXAM: RIGHT FOOT - 2 VIEW  COMPARISON:  Contralateral foot of this same date and on 04/30/2011  FINDINGS: Hallux valgus angulation. Lisfranc joint intact. No displaced fracture or dislocation. No overt destructive/ erosive change. Atherosclerotic vascular and soft tissue calcifications posteriorly. Mild flattening of the plantar arch. Calcaneal along the plantar surface.  IMPRESSION: No acute osseous finding identified by radiograph. MRI recommended if concern for osteomyelitis persists.    Electronically Signed   By: Carlos Levering M.D.   On: 06/17/2013 22:53    Microbiology: Recent Results (from the past 240 hour(s))  CULTURE, BLOOD (ROUTINE X 2)     Status: None   Collection Time    06/17/13 10:45 PM      Result Value Range Status   Specimen Description BLOOD RIGHT ARM   Final   Special Requests BOTTLES DRAWN AEROBIC AND ANAEROBIC [place]  Final   Culture  Setup Time     Final   Value: 06/18/2013 03:41     Performed at [facility]   Culture     Final   Value:        BLOOD CULTURE RECEIVED NO GROWTH TO DATE CULTURE WILL BE HELD FOR 5 DAYS BEFORE ISSUING [name] FINAL NEGATIVE REPORT     Performed at [facility]   Report Status PENDING   Incomplete  CULTURE, BLOOD (ROUTINE X 2)     Status: None   Collection Time    06/17/13 10:50 PM      Result Value Range Status   Specimen Description BLOOD RIGHT HAND   Final   Special Requests BOTTLES DRAWN AEROBIC ONLY 10CC   Final   Culture  Setup Time     Final   Value: 06/18/2013 03:41     Performed at [facility]   Culture     Final   Value:        BLOOD CULTURE RECEIVED NO GROWTH TO DATE CULTURE WILL BE HELD FOR 5 DAYS BEFORE ISSUING [name] FINAL NEGATIVE REPORT     Performed at [facility]   Report Status PENDING   Incomplete  RESPIRATORY VIRUS PANEL     Status: Abnormal   Collection Time    06/18/13  1:54 AM      Result Value Range Status   Source - [facility]AN NASAL SWAB   Corrected   Comment: CORRECTED ON 01/26 AT 1951: PREVIOUSLY REPORTED AS NASAL SWAB   Respiratory Syncytial Virus [name] NOT DETECTED   Final   Respiratory Syncytial Virus B NOT DETECTED   Final   Influenza [name] DETECTED (*)  Final   Influenza B NOT DETECTED   Final   Parainfluenza 1 NOT DETECTED   Final   Parainfluenza 2 NOT DETECTED   Final   Parainfluenza 3 NOT DETECTED   Final   Metapneumovirus NOT DETECTED   Final   Rhinovirus NOT DETECTED   Final   Adenovirus NOT DETECTED   Final   Influenza [name] H1 NOT DETECTED   Final    Influenza [name] H3 DETECTED (*)  Final   Comment: (NOTE)           Normal Reference Range for each Analyte: NOT DETECTED     Testing performed using the Luminex xTAG Respiratory Viral Panel test     kit.     This test was developed and its performance characteristics determined     by [facility]. It has not been cleared or approved by the [place]     Food and Drug Administration. This test is used for clinical purposes.     It should not be regarded as investigational or for research. This     laboratory is certified under the [place] (CLIA) as qualified to perform high complexity     clinical laboratory testing.     Performed at [facility]: Basic Metabolic Panel:  Recent Labs Lab 06/17/13 2116 06/18/13 0545 06/20/13 0801  NA 137 140 137  K 4.4 4.3 4.2  CL 97 99 100  CO2 $Re'25 25 23  'Kti$ GLUCOSE 92 97 94  BUN 33* 33* 34*  CREATININE 1.80* 1.87* 1.77*  CALCIUM 8.4 8.0* 7.2*   Liver Function Tests: No results found for this basename: AST, ALT, ALKPHOS, BILITOT, PROT, ALBUMIN,  in the last  168 hours No results found for this basename: LIPASE, AMYLASE,  in the last 168 hours No results found for this basename: AMMONIA,  in the last 168 hours CBC:  Recent Labs Lab 06/17/13 2115 06/18/13 0545 06/19/13 0550 06/20/13 0645 06/21/13 0439  WBC 12.1* 7.7 7.4 5.7 4.7  NEUTROABS 10.6*  --   --   --   --   HGB 12.2* 11.7* 11.7* 10.8* 10.2*  HCT 35.2* 33.7* 33.6* 30.7* 29.3*  MCV 86.7 86.4 86.4 85.0 85.7  PLT 124* 99* 92* 73* 76*   Cardiac Enzymes: No results found for this basename: CKTOTAL, CKMB, CKMBINDEX, TROPONINI,  in the last 168 hours BNP: BNP (last 3 results) No results found for this basename: PROBNP,  in the last 8760 hours CBG: No results found for this basename: GLUCAP,  in the last 168 hours     Signed:  [facility] MD [facility] 06/21/2013, 10:12 AM

## 2013-06-24 LAB — CULTURE, BLOOD (ROUTINE X 2)
Culture: NO GROWTH
Culture: NO GROWTH

## 2013-06-29 ENCOUNTER — Encounter (HOSPITAL_COMMUNITY): Payer: Self-pay | Admitting: Pharmacy Technician

## 2013-07-02 ENCOUNTER — Encounter (HOSPITAL_COMMUNITY): Payer: Self-pay | Admitting: *Deleted

## 2013-07-02 MED ORDER — CEFAZOLIN SODIUM-DEXTROSE 2-3 GM-% IV SOLR
2.0000 g | INTRAVENOUS | Status: AC
  Administered 2013-07-03: 2 g via INTRAVENOUS
  Filled 2013-07-02: qty 50

## 2013-07-02 NOTE — Progress Notes (Signed)
Pt stated that he hasn't taken Coumadin since he was D/C from [facility] "at the end of January 2015." Pt advised to stop taking vitamins, herbal medications (Glucosamine,  Probiotic,) and NSAID's, ie: Ibuprofen, Advil, Naproxen or any medication containing Aspirin.

## 2013-07-03 ENCOUNTER — Ambulatory Visit (HOSPITAL_COMMUNITY): Payer: Medicare HMO | Admitting: Anesthesiology

## 2013-07-03 ENCOUNTER — Inpatient Hospital Stay (HOSPITAL_COMMUNITY)
Admission: RE | Admit: 2013-07-03 | Discharge: 2013-07-04 | DRG: 505 | Disposition: A | Discharge status: Home / Self Care | Payer: Medicare HMO | Source: Ambulatory Visit | Attending: Orthopaedic Surgery | Admitting: Orthopaedic Surgery

## 2013-07-03 ENCOUNTER — Encounter (HOSPITAL_COMMUNITY)
Admission: RE | Disposition: A | Discharge status: Home / Self Care | Payer: Self-pay | Source: Ambulatory Visit | Attending: Orthopaedic Surgery

## 2013-07-03 ENCOUNTER — Encounter (HOSPITAL_COMMUNITY): Payer: Self-pay | Admitting: *Deleted

## 2013-07-03 ENCOUNTER — Encounter (HOSPITAL_COMMUNITY): Payer: Medicare HMO | Admitting: Anesthesiology

## 2013-07-03 DIAGNOSIS — M109 Gout, unspecified: Secondary | ICD-10-CM | POA: Diagnosis present

## 2013-07-03 DIAGNOSIS — G4730 Sleep apnea, unspecified: Secondary | ICD-10-CM | POA: Diagnosis present

## 2013-07-03 DIAGNOSIS — N183 Chronic kidney disease, stage 3 unspecified: Secondary | ICD-10-CM | POA: Diagnosis present

## 2013-07-03 DIAGNOSIS — I129 Hypertensive chronic kidney disease with stage 1 through stage 4 chronic kidney disease, or unspecified chronic kidney disease: Secondary | ICD-10-CM | POA: Diagnosis present

## 2013-07-03 DIAGNOSIS — Z87891 Personal history of nicotine dependence: Secondary | ICD-10-CM

## 2013-07-03 DIAGNOSIS — I2510 Atherosclerotic heart disease of native coronary artery without angina pectoris: Secondary | ICD-10-CM | POA: Diagnosis present

## 2013-07-03 DIAGNOSIS — I509 Heart failure, unspecified: Secondary | ICD-10-CM | POA: Diagnosis present

## 2013-07-03 DIAGNOSIS — L97509 Non-pressure chronic ulcer of other part of unspecified foot with unspecified severity: Secondary | ICD-10-CM | POA: Diagnosis present

## 2013-07-03 DIAGNOSIS — M069 Rheumatoid arthritis, unspecified: Secondary | ICD-10-CM | POA: Diagnosis present

## 2013-07-03 DIAGNOSIS — M9080 Osteopathy in diseases classified elsewhere, unspecified site: Secondary | ICD-10-CM | POA: Diagnosis present

## 2013-07-03 DIAGNOSIS — M869 Osteomyelitis, unspecified: Principal | ICD-10-CM

## 2013-07-03 DIAGNOSIS — Z79899 Other long term (current) drug therapy: Secondary | ICD-10-CM

## 2013-07-03 DIAGNOSIS — Z7901 Long term (current) use of anticoagulants: Secondary | ICD-10-CM

## 2013-07-03 DIAGNOSIS — I739 Peripheral vascular disease, unspecified: Secondary | ICD-10-CM | POA: Diagnosis present

## 2013-07-03 DIAGNOSIS — S98132A Complete traumatic amputation of one left lesser toe, initial encounter: Secondary | ICD-10-CM

## 2013-07-03 HISTORY — PX: AMPUTATION: SHX166

## 2013-07-03 LAB — PROTIME-INR
INR: 1.08 (ref 0.00–1.49)
PROTHROMBIN TIME: 13.8 s (ref 11.6–15.2)

## 2013-07-03 SURGERY — AMPUTATION DIGIT
Anesthesia: Monitor Anesthesia Care | Site: Toe | Laterality: Left

## 2013-07-03 MED ORDER — ALBUTEROL SULFATE HFA 108 (90 BASE) MCG/ACT IN AERS: 2.0000 | INHALATION_SPRAY | Freq: Four times a day (QID) | RESPIRATORY_TRACT | Status: DC | PRN

## 2013-07-03 MED ORDER — ALLOPURINOL 150 MG HALF TABLET
150.0000 mg | ORAL_TABLET | Freq: Every day | ORAL | Status: DC
  Administered 2013-07-03 – 2013-07-04 (×2): 150 mg via ORAL
  Filled 2013-07-03 (×2): qty 1

## 2013-07-03 MED ORDER — WARFARIN SODIUM 2.5 MG PO TABS: 2.5000 mg | ORAL_TABLET | Freq: Every day | ORAL | Status: DC

## 2013-07-03 MED ORDER — ACETAMINOPHEN 160 MG/5ML PO SOLN
325.0000 mg | ORAL | Status: DC | PRN
  Filled 2013-07-03: qty 20.3

## 2013-07-03 MED ORDER — CALCIUM CARBONATE-VITAMIN D 500-200 MG-UNIT PO TABS
1.0000 | ORAL_TABLET | Freq: Every day | ORAL | Status: DC
  Administered 2013-07-03 – 2013-07-04 (×2): 1 via ORAL
  Filled 2013-07-03 (×2): qty 1

## 2013-07-03 MED ORDER — SODIUM CHLORIDE 0.9 % IV SOLN: INTRAVENOUS | Status: DC

## 2013-07-03 MED ORDER — FENTANYL CITRATE 0.05 MG/ML IJ SOLN: 25.0000 ug | INTRAMUSCULAR | Status: DC | PRN

## 2013-07-03 MED ORDER — FENTANYL CITRATE 0.05 MG/ML IJ SOLN
INTRAMUSCULAR | Status: DC | PRN
  Administered 2013-07-03: 50 ug via INTRAVENOUS

## 2013-07-03 MED ORDER — WARFARIN SODIUM 5 MG PO TABS: 5.0000 mg | ORAL_TABLET | Freq: Every day | ORAL | Status: DC

## 2013-07-03 MED ORDER — ONDANSETRON HCL 4 MG/2ML IJ SOLN: 4.0000 mg | Freq: Four times a day (QID) | INTRAMUSCULAR | Status: DC | PRN

## 2013-07-03 MED ORDER — MORPHINE SULFATE 2 MG/ML IJ SOLN: 1.0000 mg | INTRAMUSCULAR | Status: DC | PRN

## 2013-07-03 MED ORDER — FENTANYL CITRATE 0.05 MG/ML IJ SOLN
INTRAMUSCULAR | Status: AC
  Filled 2013-07-03: qty 5

## 2013-07-03 MED ORDER — DM-GUAIFENESIN ER 30-600 MG PO TB12
2.0000 | ORAL_TABLET | Freq: Every day | ORAL | Status: DC
  Administered 2013-07-03 – 2013-07-04 (×2): 2 via ORAL
  Filled 2013-07-03 (×2): qty 2

## 2013-07-03 MED ORDER — CEFAZOLIN SODIUM 1-5 GM-% IV SOLN
1.0000 g | Freq: Three times a day (TID) | INTRAVENOUS | Status: DC
  Administered 2013-07-03 – 2013-07-04 (×2): 1 g via INTRAVENOUS
  Filled 2013-07-03 (×5): qty 50

## 2013-07-03 MED ORDER — 0.9 % SODIUM CHLORIDE (POUR BTL) OPTIME
TOPICAL | Status: DC | PRN
  Administered 2013-07-03: 1000 mL

## 2013-07-03 MED ORDER — METOCLOPRAMIDE HCL 5 MG/ML IJ SOLN: 5.0000 mg | Freq: Three times a day (TID) | INTRAMUSCULAR | Status: DC | PRN

## 2013-07-03 MED ORDER — SODIUM CHLORIDE 0.9 % IV SOLN
INTRAVENOUS | Status: DC
  Administered 2013-07-03: 14:00:00 via INTRAVENOUS
  Administered 2013-07-03: 10 mL/h via INTRAVENOUS

## 2013-07-03 MED ORDER — ONDANSETRON HCL 4 MG/2ML IJ SOLN: 4.0000 mg | Freq: Once | INTRAMUSCULAR | Status: DC | PRN

## 2013-07-03 MED ORDER — DIPHENHYDRAMINE HCL 12.5 MG/5ML PO ELIX: 12.5000 mg | ORAL_SOLUTION | ORAL | Status: DC | PRN

## 2013-07-03 MED ORDER — OXYCODONE HCL 5 MG PO TABS: 5.0000 mg | ORAL_TABLET | Freq: Once | ORAL | Status: DC | PRN

## 2013-07-03 MED ORDER — TAMSULOSIN HCL 0.4 MG PO CAPS
0.4000 mg | ORAL_CAPSULE | Freq: Every day | ORAL | Status: DC
  Administered 2013-07-03: 0.4 mg via ORAL
  Filled 2013-07-03 (×2): qty 1

## 2013-07-03 MED ORDER — OXYCODONE HCL 5 MG PO TABS: 5.0000 mg | ORAL_TABLET | ORAL | Status: DC | PRN

## 2013-07-03 MED ORDER — WARFARIN - PHYSICIAN DOSING INPATIENT: Freq: Every day | Status: DC

## 2013-07-03 MED ORDER — ACAI 25 MG PO CAPS: 1.0000 | ORAL_CAPSULE | Freq: Every day | ORAL | Status: DC

## 2013-07-03 MED ORDER — ADULT MULTIVITAMIN W/MINERALS CH
1.0000 | ORAL_TABLET | Freq: Every day | ORAL | Status: DC
  Administered 2013-07-03 – 2013-07-04 (×2): 1 via ORAL
  Filled 2013-07-03 (×2): qty 1

## 2013-07-03 MED ORDER — METHOCARBAMOL 500 MG PO TABS: 500.0000 mg | ORAL_TABLET | Freq: Four times a day (QID) | ORAL | Status: DC | PRN

## 2013-07-03 MED ORDER — FUROSEMIDE 20 MG PO TABS
20.0000 mg | ORAL_TABLET | Freq: Every day | ORAL | Status: DC
  Administered 2013-07-03 – 2013-07-04 (×2): 20 mg via ORAL
  Filled 2013-07-03 (×2): qty 1

## 2013-07-03 MED ORDER — ACETAMINOPHEN 325 MG PO TABS: 325.0000 mg | ORAL_TABLET | ORAL | Status: DC | PRN

## 2013-07-03 MED ORDER — ONDANSETRON HCL 4 MG PO TABS: 4.0000 mg | ORAL_TABLET | Freq: Four times a day (QID) | ORAL | Status: DC | PRN

## 2013-07-03 MED ORDER — HYDROCODONE-ACETAMINOPHEN 5-325 MG PO TABS: 1.0000 | ORAL_TABLET | ORAL | Status: DC | PRN

## 2013-07-03 MED ORDER — WARFARIN SODIUM 5 MG PO TABS
5.0000 mg | ORAL_TABLET | Freq: Once | ORAL | Status: AC
  Administered 2013-07-03: 5 mg via ORAL
  Filled 2013-07-03: qty 1

## 2013-07-03 MED ORDER — BUPIVACAINE HCL (PF) 0.25 % IJ SOLN
INTRAMUSCULAR | Status: DC | PRN
  Administered 2013-07-03: 8 mL

## 2013-07-03 MED ORDER — BUPIVACAINE HCL (PF) 0.25 % IJ SOLN
INTRAMUSCULAR | Status: AC
  Filled 2013-07-03: qty 30

## 2013-07-03 MED ORDER — METOCLOPRAMIDE HCL 10 MG PO TABS
5.0000 mg | ORAL_TABLET | Freq: Three times a day (TID) | ORAL | Status: DC | PRN
Start: 2013-07-03 — End: 2013-07-04

## 2013-07-03 MED ORDER — METHOCARBAMOL 100 MG/ML IJ SOLN
500.0000 mg | Freq: Four times a day (QID) | INTRAMUSCULAR | Status: DC | PRN
  Filled 2013-07-03: qty 5

## 2013-07-03 MED ORDER — LIDOCAINE HCL (CARDIAC) 20 MG/ML IV SOLN
INTRAVENOUS | Status: AC
  Filled 2013-07-03: qty 5

## 2013-07-03 MED ORDER — WARFARIN - PHARMACIST DOSING INPATIENT: Freq: Every day | Status: DC

## 2013-07-03 MED ORDER — PROPOFOL 10 MG/ML IV BOLUS
INTRAVENOUS | Status: DC | PRN
  Administered 2013-07-03: 20 mg via INTRAVENOUS

## 2013-07-03 MED ORDER — ONE-DAILY MULTI VITAMINS PO TABS: 1.0000 | ORAL_TABLET | Freq: Every day | ORAL | Status: DC

## 2013-07-03 MED ORDER — OXYCODONE HCL 5 MG/5ML PO SOLN
5.0000 mg | Freq: Once | ORAL | Status: DC | PRN
Start: 2013-07-03 — End: 2013-07-03

## 2013-07-03 MED ORDER — ALBUTEROL SULFATE (2.5 MG/3ML) 0.083% IN NEBU: 2.5000 mg | INHALATION_SOLUTION | Freq: Four times a day (QID) | RESPIRATORY_TRACT | Status: DC | PRN

## 2013-07-03 MED ORDER — PREDNISONE 10 MG PO TABS
10.0000 mg | ORAL_TABLET | Freq: Every day | ORAL | Status: DC
  Administered 2013-07-04: 10 mg via ORAL
  Filled 2013-07-03 (×2): qty 1

## 2013-07-03 MED ORDER — PROPOFOL 10 MG/ML IV BOLUS
INTRAVENOUS | Status: AC
  Filled 2013-07-03: qty 20

## 2013-07-03 SURGICAL SUPPLY — 54 items
BANDAGE GAUZE 4  KLING STR (GAUZE/BANDAGES/DRESSINGS) IMPLANT
BANDAGE GAUZE ELAST BULKY 4 IN (GAUZE/BANDAGES/DRESSINGS) ×3 IMPLANT
BLADE AVERAGE 25MMX9MM (BLADE)
BLADE AVERAGE 25X9 (BLADE) IMPLANT
BLADE MINI RND TIP GREEN BEAV (BLADE) IMPLANT
BNDG CMPR 9X4 STRL LF SNTH (GAUZE/BANDAGES/DRESSINGS) ×1
BNDG COHESIVE 1X5 TAN STRL LF (GAUZE/BANDAGES/DRESSINGS) IMPLANT
BNDG COHESIVE 4X5 TAN STRL (GAUZE/BANDAGES/DRESSINGS) ×3 IMPLANT
BNDG COHESIVE 6X5 TAN STRL LF (GAUZE/BANDAGES/DRESSINGS) IMPLANT
BNDG ESMARK 4X9 LF (GAUZE/BANDAGES/DRESSINGS) ×3 IMPLANT
BNDG GAUZE STRTCH 6 (GAUZE/BANDAGES/DRESSINGS) IMPLANT
CLOTH BEACON ORANGE TIMEOUT ST (SAFETY) ×3 IMPLANT
CORDS BIPOLAR (ELECTRODE) ×3 IMPLANT
COVER SURGICAL LIGHT HANDLE (MISCELLANEOUS) ×3 IMPLANT
CUFF TOURNIQUET SINGLE 18IN (TOURNIQUET CUFF) IMPLANT
CUFF TOURNIQUET SINGLE 24IN (TOURNIQUET CUFF) IMPLANT
CUFF TOURNIQUET SINGLE 34IN LL (TOURNIQUET CUFF) IMPLANT
DRAPE U-SHAPE 47X51 STRL (DRAPES) ×3 IMPLANT
DURAPREP 26ML APPLICATOR (WOUND CARE) ×3 IMPLANT
ELECT REM PT RETURN 9FT ADLT (ELECTROSURGICAL) ×3
ELECTRODE REM PT RTRN 9FT ADLT (ELECTROSURGICAL) ×1 IMPLANT
GAUZE SPONGE 2X2 8PLY STRL LF (GAUZE/BANDAGES/DRESSINGS) IMPLANT
GAUZE XEROFORM 1X8 LF (GAUZE/BANDAGES/DRESSINGS) ×3 IMPLANT
GLOVE BIO SURGEON STRL SZ8 (GLOVE) ×3 IMPLANT
GLOVE BIOGEL PI IND STRL 8 (GLOVE) ×1 IMPLANT
GLOVE BIOGEL PI INDICATOR 8 (GLOVE) ×2
GLOVE ORTHO TXT STRL SZ7.5 (GLOVE) ×3 IMPLANT
GOWN PREVENTION PLUS LG XLONG (DISPOSABLE) IMPLANT
GOWN STRL REUS W/ TWL LRG LVL3 (GOWN DISPOSABLE) ×1 IMPLANT
GOWN STRL REUS W/ TWL XL LVL3 (GOWN DISPOSABLE) ×4 IMPLANT
GOWN STRL REUS W/TWL LRG LVL3 (GOWN DISPOSABLE) ×2
GOWN STRL REUS W/TWL XL LVL3 (GOWN DISPOSABLE) ×12
KIT BASIN OR (CUSTOM PROCEDURE TRAY) ×3 IMPLANT
KIT ROOM TURNOVER OR (KITS) ×3 IMPLANT
MANIFOLD NEPTUNE II (INSTRUMENTS) ×3 IMPLANT
NEEDLE HYPO 25GX1X1/2 BEV (NEEDLE) IMPLANT
NS IRRIG 1000ML POUR BTL (IV SOLUTION) ×3 IMPLANT
PACK ORTHO EXTREMITY (CUSTOM PROCEDURE TRAY) ×3 IMPLANT
PAD ABD 8X10 STRL (GAUZE/BANDAGES/DRESSINGS) ×3 IMPLANT
PAD ARMBOARD 7.5X6 YLW CONV (MISCELLANEOUS) ×3 IMPLANT
PAD CAST 4YDX4 CTTN HI CHSV (CAST SUPPLIES) IMPLANT
PADDING CAST COTTON 4X4 STRL (CAST SUPPLIES)
SPECIMEN JAR SMALL (MISCELLANEOUS) ×3 IMPLANT
SPONGE GAUZE 2X2 STER 10/PKG (GAUZE/BANDAGES/DRESSINGS)
SPONGE GAUZE 4X4 12PLY (GAUZE/BANDAGES/DRESSINGS) ×3 IMPLANT
SUCTION FRAZIER TIP 10 FR DISP (SUCTIONS) IMPLANT
SUT ETHILON 2 0 FS 18 (SUTURE) IMPLANT
SUT VIC AB 2-0 FS1 27 (SUTURE) IMPLANT
SYR CONTROL 10ML LL (SYRINGE) IMPLANT
TOWEL OR 17X24 6PK STRL BLUE (TOWEL DISPOSABLE) ×3 IMPLANT
TOWEL OR 17X26 10 PK STRL BLUE (TOWEL DISPOSABLE) ×3 IMPLANT
TUBE CONNECTING 12'X1/4 (SUCTIONS)
TUBE CONNECTING 12X1/4 (SUCTIONS) IMPLANT
WATER STERILE IRR 1000ML POUR (IV SOLUTION) ×3 IMPLANT

## 2013-07-03 NOTE — Progress Notes (Signed)
ANTICOAGULATION CONSULT NOTE - Initial Consult  Pharmacy Consult for warfarin Indication: chronic VTE  Allergies  Allergen Reactions  . Iohexol      Code: RASH, Desc: PT STATES HE HAD A REACTION TO IV DYE 06/29/06/RM, Onset Date: [id number]     Patient Measurements: Height: 5\' 9"  (175.3 cm) Weight: 185 lb (83.915 kg) IBW/kg (Calculated) : 70.7  Vital Signs: Temp: 98.8 F (37.1 C) (02/10 2105) Temp src: Oral (02/10 2105) BP: 125/74 mmHg (02/10 2105) Pulse Rate: 86 (02/10 2105)  Labs:  Recent Labs  07/03/13 1835  LABPROT 13.8  INR 1.08    Estimated Creatinine Clearance: 27.7 ml/min (by C-G formula based on Cr of 1.77).   Medical History: Past Medical History  Diagnosis Date  . Hypertension   . Chronic kidney disease   . PAD (peripheral artery disease)   . Rheumatoid arthritis(714.0)   . CKD (chronic kidney disease), stage III   . Anemia   . Gout     Medications:  Prescriptions prior to admission  Medication Sig Dispense Refill  . Acai 25 MG CAPS Take 1 capsule by mouth daily.      08/31/13 albuterol (PROVENTIL HFA;VENTOLIN HFA) 108 (90 BASE) MCG/ACT inhaler Inhale 2 puffs into the lungs every 6 (six) hours as needed for wheezing or shortness of breath.      . allopurinol (ZYLOPRIM) 300 MG tablet Take 150 mg by mouth daily.       . calcium-vitamin D (OSCAL WITH D) 500-200 MG-UNIT per tablet Take 1 tablet by mouth daily.       Marland Kitchen dextromethorphan-guaiFENesin (MUCINEX DM) 30-600 MG per 12 hr tablet Take 2 tablets by mouth daily.      . furosemide (LASIX) 20 MG tablet Take 20 mg by mouth daily.       . Glucosamine 500 MG CAPS Take 2 capsules by mouth daily.       . methocarbamol (ROBAXIN) 500 MG tablet Take 500 mg by mouth 3 (three) times daily as needed for muscle spasms.      . methotrexate (RHEUMATREX) 2.5 MG tablet Take 7.5 mg by mouth once a week. Caution:Chemotherapy. Protect from light. Take on Monday      . Multiple Vitamin (MULTIVITAMIN) tablet Take 1 tablet by  mouth daily.      . predniSONE (DELTASONE) 10 MG tablet Take 10 mg by mouth daily with breakfast.      . Probiotic Product (PROBIOTIC DAILY PO) Take 1 tablet by mouth daily.      . tamsulosin (FLOMAX) 0.4 MG CAPS capsule Take 0.4 mg by mouth at bedtime.      Saturday warfarin (COUMADIN) 2.5 MG tablet Take 2.5 mg by mouth daily. Take 2.5 mg on Monday and Saturday.      . warfarin (COUMADIN) 5 MG tablet Take 5 mg by mouth daily. Take 5 mg on Tuesday, Wednesday, Thursday, Friday, and Sunday      . albuterol (PROVENTIL HFA;VENTOLIN HFA) 108 (90 BASE) MCG/ACT inhaler Inhale 2 puffs into the lungs every 6 (six) hours as needed for wheezing.  1 Inhaler  0    Assessment: 80 year old pt admitted for L foot 2nd toe chronic draining wound who underwent a 2nd L toe amputation.  Pt is on warfarin PTA for a history of chronic VTE.  PTA dose: 2.5 mg po on Mon/Sat, 5 mg po on all other days.  INR at admission is subtherapeutic at 1.08, patient states he has not taken warfarin since the end of  January 2015.  Hgb 10.2, plts 76.  Goal of Therapy:  INR 2-3 Monitor platelets by anticoagulation protocol: Yes   Plan:  Warfarin 5 mg po x1 Daily INR  Agapito Games, PharmD, BCPS Clinical Pharmacist 07/03/2013 9:19 PM

## 2013-07-03 NOTE — Brief Op Note (Signed)
07/03/2013  2:27 PM  PATIENT:  Jonny Ruiz T Gewirtz  80 y.o. male  PRE-OPERATIVE DIAGNOSIS:  Left 2nd toe osteomyelitis  POST-OPERATIVE DIAGNOSIS:  Left 2nd toe osteomyelitis  PROCEDURE:  Procedure(s): LEFT 2ND TOE AMPUTATION  (Left)  SURGEON:  Surgeon(s) and Role:    * Kathryne Hitch, MD - Primary  PHYSICIAN ASSISTANT: Rexene Edison, PA-C  ANESTHESIA:   local and IV sedation  EBL:  Total I/O In: 250 [I.V.:250] Out: -   BLOOD ADMINISTERED:none  DRAINS: none   LOCAL MEDICATIONS USED:  MARCAINE     SPECIMEN:  No Specimen  DISPOSITION OF SPECIMEN:  N/A  COUNTS:  YES  TOURNIQUET:  * No tourniquets in log *  DICTATION: .Other Dictation: Dictation Number [id number]  PLAN OF CARE: Admit to inpatient   PATIENT DISPOSITION:  PACU - hemodynamically stable.   Delay start of Pharmacological VTE agent (>24hrs) due to surgical blood loss or risk of bleeding: no

## 2013-07-03 NOTE — Progress Notes (Signed)
Orthopedic Tech Progress Note Patient Details:  [name] [name] April 25, 1921 [id number]  Ortho Devices Type of Ortho Device: Postop shoe/boot Ortho Device/Splint Location: LLE Ortho Device/Splint Interventions: Ordered;Application   Jennye Moccasin 07/03/2013, 4:24 PM

## 2013-07-03 NOTE — Transfer of Care (Signed)
Immediate Anesthesia Transfer of Care Note  Patient: [name] [name]  Procedure(s) Performed: Procedure(s): LEFT 2ND TOE AMPUTATION  (Left)  Patient Location: PACU  Anesthesia Type:MAC  Level of Consciousness: awake, alert , oriented and patient cooperative  Airway & Oxygen Therapy: Patient Spontanous Breathing and Patient connected to nasal cannula oxygen  Post-op Assessment: Report given to PACU RN, Post -op Vital signs reviewed and stable and Patient moving all extremities  Post vital signs: Reviewed and stable  Complications: No apparent anesthesia complications

## 2013-07-03 NOTE — Anesthesia Preprocedure Evaluation (Signed)
Anesthesia Evaluation  Patient identified by MRN, date of birth, ID band Patient awake    Reviewed: Allergy & Precautions, H&P , NPO status , Patient's Chart, lab work & pertinent test results  History of Anesthesia Complications Negative for: history of anesthetic complications  Airway Mallampati: II TM Distance: >3 FB Neck ROM: Full    Dental  (+) Upper Dentures and Lower Dentures   Pulmonary neg sleep apnea, neg COPDRecent URI , Resolved, former smoker,    Pulmonary exam normal       Cardiovascular hypertension, Pt. on medications - angina+ Peripheral Vascular Disease and +CHF - CAD Rhythm:Regular Rate:Normal     Neuro/Psych negative neurological ROS     GI/Hepatic negative GI ROS, Neg liver ROS,   Endo/Other  neg diabetesChronic steroids, 10mg  prednisone  Renal/GU Renal InsufficiencyRenal disease     Musculoskeletal  (+) Arthritis -, Rheumatoid disorders,    Abdominal   Peds  Hematology  (+) anemia ,   Anesthesia Other Findings   Reproductive/Obstetrics                           Anesthesia Physical Anesthesia Plan  ASA: III  Anesthesia Plan: MAC   Post-op Pain Management:    Induction: Intravenous  Airway Management Planned: Mask  Additional Equipment: None  Intra-op Plan:   Post-operative Plan:   Informed Consent: I have reviewed the patients History and Physical, chart, labs and discussed the procedure including the risks, benefits and alternatives for the proposed anesthesia with the patient or authorized representative who has indicated his/her understanding and acceptance.   Consent reviewed with POA  Plan Discussed with: Surgeon and CRNA  Anesthesia Plan Comments:         Anesthesia Quick Evaluation

## 2013-07-03 NOTE — Progress Notes (Signed)
Per Dr. Magnus Ivan we do not need to repeat pt ptt.

## 2013-07-03 NOTE — H&P (Signed)
[name] [name] is an 80 y.o. male.   Chief Complaint:   Left foot 2nd toe with chronic draining wound HPI:   80 yo male with left foot second toe known osteomyelitis of the distal phalanx and a chronic draining wound.  It is recommended that he undergo a left 2nd toe amputation given the infection in the bone.  Past Medical History  Diagnosis Date  . Hypertension   . Chronic kidney disease   . PAD (peripheral artery disease)   . Rheumatoid arthritis(714.0)   . CKD (chronic kidney disease), stage III   . Anemia   . Gout     Past Surgical History  Procedure Laterality Date  . Hernia repair  1982  . Hernia repair    . Amputation  05/02/2011    Procedure: AMPUTATION DIGIT;  Surgeon: Kathryne Hitch;  Location: [facility] OR;  Service: Orthopedics;  Laterality: Left;  left great toe  . Colonoscopy w/ biopsies and polypectomy      Hx: of    History reviewed. No pertinent family history. Social History:  reports that he has quit smoking. He has never used smokeless tobacco. He reports that he does not drink alcohol or use illicit drugs.  Allergies:  Allergies  Allergen Reactions  . Iohexol      Code: RASH, Desc: PT STATES HE HAD A REACTION TO IV DYE 06/29/06/RM, Onset Date: [id number]     Medications Prior to Admission  Medication Sig Dispense Refill  . Acai 25 MG CAPS Take 1 capsule by mouth daily.      Marland Kitchen albuterol (PROVENTIL HFA;VENTOLIN HFA) 108 (90 BASE) MCG/ACT inhaler Inhale 2 puffs into the lungs every 6 (six) hours as needed for wheezing or shortness of breath.      . allopurinol (ZYLOPRIM) 300 MG tablet Take 150 mg by mouth daily.       . calcium-vitamin D (OSCAL WITH D) 500-200 MG-UNIT per tablet Take 1 tablet by mouth daily.       Marland Kitchen dextromethorphan-guaiFENesin (MUCINEX DM) 30-600 MG per 12 hr tablet Take 2 tablets by mouth daily.      . furosemide (LASIX) 20 MG tablet Take 20 mg by mouth daily.       . Glucosamine 500 MG CAPS Take 2 capsules by mouth daily.       .  methocarbamol (ROBAXIN) 500 MG tablet Take 500 mg by mouth 3 (three) times daily as needed for muscle spasms.      . methotrexate (RHEUMATREX) 2.5 MG tablet Take 7.5 mg by mouth once a week. Caution:Chemotherapy. Protect from light. Take on Monday      . Multiple Vitamin (MULTIVITAMIN) tablet Take 1 tablet by mouth daily.      . predniSONE (DELTASONE) 10 MG tablet Take 10 mg by mouth daily with breakfast.      . Probiotic Product (PROBIOTIC DAILY PO) Take 1 tablet by mouth daily.      . tamsulosin (FLOMAX) 0.4 MG CAPS capsule Take 0.4 mg by mouth at bedtime.      Marland Kitchen warfarin (COUMADIN) 2.5 MG tablet Take 2.5 mg by mouth daily. Take 2.5 mg on Monday and Saturday.      . warfarin (COUMADIN) 5 MG tablet Take 5 mg by mouth daily. Take 5 mg on Tuesday, Wednesday, Thursday, Friday, and Sunday      . albuterol (PROVENTIL HFA;VENTOLIN HFA) 108 (90 BASE) MCG/ACT inhaler Inhale 2 puffs into the lungs every 6 (six) hours as needed for wheezing.  1  Inhaler  0    No results found for this or any previous visit (from the past 48 hour(s)). No results found.  Review of Systems  All other systems reviewed and are negative.    Blood pressure 133/80, pulse 114, temperature 98 F (36.7 C), temperature source Oral, resp. rate 18, height 5\' 9"  (1.753 m), weight 83.915 kg (185 lb), SpO2 95.00%. Physical Exam  Constitutional: He is oriented to person, place, and time. He appears well-developed and well-nourished.  HENT:  Head: Normocephalic and atraumatic.  Eyes: EOM are normal. Pupils are equal, round, and reactive to light.  Neck: Normal range of motion. Neck supple.  Cardiovascular: Normal rate and regular rhythm.   Respiratory: Effort normal and breath sounds normal.  GI: Soft. Bowel sounds are normal.  Musculoskeletal:       Feet:  Neurological: He is alert and oriented to person, place, and time.  Skin: Skin is warm and dry.  Psychiatric: He has a normal mood and affect.     Assessment/Plan Left  foot 2nd toe with chronic wound and underlying osteomyelitis of the distal phalanx 1)  To the OR today for an amputation of his left foot 2nd toe  Aries Kasa Y 07/03/2013, 12:38 PM

## 2013-07-04 LAB — PROTIME-INR
INR: 1.13 (ref 0.00–1.49)
PROTHROMBIN TIME: 14.3 s (ref 11.6–15.2)

## 2013-07-04 NOTE — Discharge Summary (Signed)
Patient ID: [name] [name] MRN: [id number] DOB/AGE: 1920-11-15 80 y.o.  Admit date: 07/03/2013 Discharge date: 07/04/2013  Admission Diagnoses:  Principal Problem:   Osteomyelitis of toe of left foot Active Problems:   Amputated toe of left foot   Discharge Diagnoses:  Same  Past Medical History  Diagnosis Date  . Hypertension   . Chronic kidney disease   . PAD (peripheral artery disease)   . Rheumatoid arthritis(714.0)   . CKD (chronic kidney disease), stage III   . Anemia   . Gout     Surgeries: Procedure(s): LEFT 2ND TOE AMPUTATION  on 07/03/2013   Consultants:    Discharged Condition: Improved  Hospital Course: [name] [name] is an 80 y.o. male who was admitted 07/03/2013 for operative treatment ofOsteomyelitis of toe of left foot. Patient has severe unremitting pain that affects sleep, daily activities, and work/hobbies. After pre-op clearance the patient was taken to the operating room on 07/03/2013 and underwent  Procedure(s): LEFT 2ND TOE AMPUTATION .    Patient was given perioperative antibiotics: Anti-infectives   Start     Dose/Rate Route Frequency Ordered Stop   07/03/13 1630  ceFAZolin (ANCEF) IVPB 1 g/50 mL premix     1 g 100 mL/hr over 30 Minutes Intravenous 3 times per day 07/03/13 1554 07/04/13 1359   07/03/13 0600  ceFAZolin (ANCEF) IVPB 2 g/50 mL premix     2 g 100 mL/hr over 30 Minutes Intravenous On call to O.R. 07/02/13 1414 07/03/13 1415       Patient was given sequential compression devices, early ambulation, and chemoprophylaxis to prevent DVT.  Patient benefited maximally from hospital stay and there were no complications.    Recent vital signs: Patient Vitals for the past 24 hrs:  BP Temp Temp src Pulse Resp SpO2 Height Weight  07/04/13 0547 139/86 mmHg 98.1 F (36.7 C) Oral 86 18 94 % - -  07/04/13 0220 129/79 mmHg 98.9 F (37.2 C) Oral 81 18 99 % - -  07/03/13 2105 125/74 mmHg 98.8 F (37.1 C) Oral 86 18 99 % - -  07/03/13 1657  144/72 mmHg 98.2 F (36.8 C) Oral 92 - 99 % - -  07/03/13 1530 140/77 mmHg 98.9 F (37.2 C) - 81 16 97 % - -  07/03/13 1517 - 98.3 F (36.8 C) - - - - - -  07/03/13 1515 - - - 77 14 97 % - -  07/03/13 1514 - - - 84 21 97 % - -  07/03/13 1503 137/74 mmHg - - 72 21 97 % - -  07/03/13 1500 - - - 85 15 96 % - -  07/03/13 1448 136/78 mmHg - - 85 20 94 % - -  07/03/13 1445 - - - 84 20 94 % - -  07/03/13 1434 136/88 mmHg - - 86 19 95 % - -  07/03/13 1433 - 98.3 F (36.8 C) - - - - - -  07/03/13 1200 133/80 mmHg 98 F (36.7 C) Oral 114 18 95 % 5\' 9"  (1.753 m) 83.915 kg (185 lb)     Recent laboratory studies:  Recent Labs  07/03/13 1835 07/04/13 0350  INR 1.08 1.13     Discharge Medications:     Medication List         Acai 25 MG Caps  Take 1 capsule by mouth daily.     albuterol 108 (90 BASE) MCG/ACT inhaler  Commonly known as:  PROVENTIL HFA;VENTOLIN HFA  Inhale 2  puffs into the lungs every 6 (six) hours as needed for wheezing or shortness of breath.     albuterol 108 (90 BASE) MCG/ACT inhaler  Commonly known as:  PROVENTIL HFA;VENTOLIN HFA  Inhale 2 puffs into the lungs every 6 (six) hours as needed for wheezing.     allopurinol 300 MG tablet  Commonly known as:  ZYLOPRIM  Take 150 mg by mouth daily.     calcium-vitamin D 500-200 MG-UNIT per tablet  Commonly known as:  OSCAL WITH D  Take 1 tablet by mouth daily.     COUMADIN 2.5 MG tablet  Generic drug:  warfarin  Take 2.5 mg by mouth daily. Take 2.5 mg on Monday and Saturday.     warfarin 5 MG tablet  Commonly known as:  COUMADIN  Take 5 mg by mouth daily. Take 5 mg on Tuesday, Wednesday, Thursday, Friday, and Sunday     dextromethorphan-guaiFENesin 30-600 MG per 12 hr tablet  Commonly known as:  MUCINEX DM  Take 2 tablets by mouth daily.     furosemide 20 MG tablet  Commonly known as:  LASIX  Take 20 mg by mouth daily.     Glucosamine 500 MG Caps  Take 2 capsules by mouth daily.     methocarbamol 500  MG tablet  Commonly known as:  ROBAXIN  Take 500 mg by mouth 3 (three) times daily as needed for muscle spasms.     methotrexate 2.5 MG tablet  Commonly known as:  RHEUMATREX  Take 7.5 mg by mouth once a week. Caution:Chemotherapy. Protect from light. Take on Monday     multivitamin tablet  Take 1 tablet by mouth daily.     predniSONE 10 MG tablet  Commonly known as:  DELTASONE  Take 10 mg by mouth daily with breakfast.     PROBIOTIC DAILY PO  Take 1 tablet by mouth daily.     tamsulosin 0.4 MG Caps capsule  Commonly known as:  FLOMAX  Take 0.4 mg by mouth at bedtime.        Diagnostic Studies: Dg Chest 2 View  06/17/2013   CLINICAL DATA:  Hypertension, peripheral artery disease, fall.  EXAM: CHEST  2 VIEW  COMPARISON:  05/03/2011  FINDINGS: Cardiomediastinal contours are similar to prior, upper normal to mildly prominent. Mild aortic tortuosity. No confluent airspace opacity, pleural effusion, or pneumothorax. Limited osseous evaluation due to technique. Chronic changes of the left femoral neck. Degenerative changes of the spine.  IMPRESSION: Mildly prominent cardiomediastinal contours, similar to prior. No acute process identified.   Electronically Signed   By: Jearld Lesch M.D.   On: 06/17/2013 23:03   Dg Cervical Spine Complete  06/04/2013   CLINICAL DATA:  Neck pain  EXAM: CERVICAL SPINE  4+ VIEWS  COMPARISON:  None.  FINDINGS: There is no evidence of acute fracture nor dislocation. Multilevel areas of severe disc space narrowing appreciated from the C3 through the C7 levels. There is also endplate hypertrophic spurring, subchondral sclerosis and areas of facet arthropathy. Areas of neuroforaminal narrowing identified on the left at the C3-4 C4-5 levels and on the right at the C6-7 C7-T1 levels. There is mild dextroscoliosis of the cervical spine.  IMPRESSION: Multilevel, multifactorial areas of spondylolysis. Further evaluation with cervical MRI is recommended. There is no  evidence of acute osseous abnormalities.   Electronically Signed   By: Salome Holmes M.D.   On: 06/04/2013 11:57   Dg Chest Port 1 View  06/18/2013   CLINICAL DATA:  Possible aspiration  EXAM: PORTABLE CHEST - 1 VIEW  COMPARISON:  06/17/2013  FINDINGS: Perihilar and bibasilar vascular congestion and interstitial prominence suspicious for early edema pattern. Heart is enlarged. No effusion or pneumothorax. Trachea midline. Atherosclerosis of the aorta.  IMPRESSION: Perihilar and basilar developing edema.   Electronically Signed   By: Ruel Favors M.D.   On: 06/18/2013 23:22   Dg Foot 2 Views Left  06/17/2013   CLINICAL DATA:  Diabetic ulcers, open wound distal second digit.  EXAM: LEFT FOOT - 2 VIEW  COMPARISON:  04/30/2011  FINDINGS: Interval resection at the first metatarsal-phalangeal joint. Presumed resection at the tip of the second digit distal phalanx. Mild irregularity at the right second digit distal phalanx margin and mild overlying soft tissue irregularity. No displaced acute fracture or dislocation. Atherosclerotic vascular calcifications and posterior soft tissue calcifications. Plantar calcaneal enthesopathy.  IMPRESSION: Resection of the first digit at the metatarsophalangeal joint and the distal aspect of the distal phalanx second digit. There is mild osseous irregularity at the tip of the distal second digit. Consider MRI follow-up to evaluate for early/mild osteomyelitis.   Electronically Signed   By: Jearld Lesch M.D.   On: 06/17/2013 23:01   Dg Foot 2 Views Right  06/17/2013   CLINICAL DATA:  Diabetic, distal toe soreness  EXAM: RIGHT FOOT - 2 VIEW  COMPARISON:  Contralateral foot of this same date and on 04/30/2011  FINDINGS: Hallux valgus angulation. Lisfranc joint intact. No displaced fracture or dislocation. No overt destructive/ erosive change. Atherosclerotic vascular and soft tissue calcifications posteriorly. Mild flattening of the plantar arch. Calcaneal along the plantar  surface.  IMPRESSION: No acute osseous finding identified by radiograph. MRI recommended if concern for osteomyelitis persists.   Electronically Signed   By: Jearld Lesch M.D.   On: 06/17/2013 22:53    Disposition: to home      Discharge Orders   Future Appointments Provider Department Dept Phone   09/25/2013 2:15 PM Lesleigh Noe, MD [facility] [phone]   Future Orders Complete By Expires   Call MD / Call 911  As directed    Comments:     If you experience chest pain or shortness of breath, CALL 911 and be transported to the hospital emergency room.  If you develope a fever above 101 F, pus (white drainage) or increased drainage or redness at the wound, or calf pain, call your surgeon's office.   Constipation Prevention  As directed    Comments:     Drink plenty of fluids.  Prune juice may be helpful.  You may use a stool softener, such as Colace (over the counter) 100 mg twice a day.  Use MiraLax (over the counter) for constipation as needed.   Diet - low sodium heart healthy  As directed    Discharge instructions  As directed    Comments:     Full weight as tolerated on your left foot. Can resume regular shoe wear starting Monday 07/09/13. New band-aids over your incision daily with a small amount of neosporin. Can get your left foot wet in the shower in 3 days.   Discharge patient  As directed    Increase activity slowly as tolerated  As directed       Follow-up Information   Follow up with Kathryne Hitch, MD. Schedule an appointment as soon as possible for a visit in 2 weeks.   Specialty:  Orthopedic Surgery   Contact information:   [address] [place] [address] [phone]        Signed: Kathryne Hitch 07/04/2013, 7:28 AM

## 2013-07-04 NOTE — Progress Notes (Signed)
Patient ID: [name] [name], male   DOB: [birth year], 80 y.o.   MRN: [id number] Looks great.  No pain.  Incision clean at left foot.  Can discharge to home today.

## 2013-07-04 NOTE — Op Note (Signed)
NAMEMarland Kitchen  [name] [name], [name] [name] NO.:  [id number]  MEDICAL RECORD NO.:  [id number]  LOCATION:  5N29C                        FACILITY:  [facility]  PHYSICIAN:  Vanita Panda. Magnus Ivan, M.D.DATE OF BIRTH:  1921-03-05  DATE OF PROCEDURE:  07/03/2013 DATE OF DISCHARGE:                              OPERATIVE REPORT   PREOPERATIVE DIAGNOSIS:  Left foot second toe wound with osteomyelitis of the distal phalanx.  POSTOPERATIVE DIAGNOSIS:  Left foot second toe wound with osteomyelitis of the distal phalanx.  PROCEDURE:  Left foot second toe amputation through the IP joint.  SURGEON:  Vanita Panda. Magnus Ivan, M.D.  ASSISTING:  Richardean Canal, PA-C.  ANESTHESIA: 1. Local left second toe digital block, using 0.25% plain Marcaine 2. IV sedation.  ESTIMATED BLOOD LOSS:  Less than 10 mL.  COMPLICATIONS:  None.  INDICATIONS:  [name] is a 80 year old gentleman well known to me. He has had a previous left foot great toe amputation about 2 years ago. He has had chronic wound on the tip of his left foot second toe for sometime now on radiographic evidence shows multi evidence of osteomyelitis.  He has actually good blood flow that toe in general and does wish to proceed with the amputation of the tip of the toe.  He understands the risks and benefits of this and does wish to proceed.  DESCRIPTION OF PROCEDURE:  After informed consent was obtained, appropriate left foot was marked.  He was brought to the operating room, placed supine on the on operating room table.  Time-out was called and to identify the correct patient, correct left foot.  We cleaned the foot with alcohol around the second digital area for a block and performed a digital block using 0.25% plain Marcaine.  We then cleaned the foot with DuraPrep and sterile drapes.  A little bit of IV sedation was given as well.  I then used a #10 blade and was able to amputate his toe through the IP joint.  We got a good bleeding  tissue.  I was able to remove part of the middle phalanx and then fashioned a flap of tissue to close the wound.  Well-padded sterile dressing was applied and he was taken to recovery room in stable condition, awake and stable condition.     Vanita Panda. Magnus Ivan, M.D.     CYB/MEDQ  D:  07/03/2013  T:  07/04/2013  Job:  [id number]

## 2013-07-05 ENCOUNTER — Encounter (HOSPITAL_COMMUNITY): Payer: Self-pay | Admitting: Orthopaedic Surgery

## 2013-07-16 NOTE — Anesthesia Postprocedure Evaluation (Signed)
  Anesthesia Post-op Note  Patient: [name] [name]  Procedure(s) Performed: Procedure(s): LEFT 2ND TOE AMPUTATION  (Left)  Patient Location: PACU  Anesthesia Type:MAC  Level of Consciousness: awake  Airway and Oxygen Therapy: Patient Spontanous Breathing  Post-op Pain: none  Post-op Assessment: Post-op Vital signs reviewed  Post-op Vital Signs: stable  Complications: No apparent anesthesia complications

## 2013-08-09 ENCOUNTER — Encounter: Payer: Self-pay | Admitting: Interventional Cardiology

## 2013-09-25 ENCOUNTER — Ambulatory Visit: Payer: Medicare Other | Admitting: Interventional Cardiology

## 2013-10-12 ENCOUNTER — Encounter: Payer: Self-pay | Admitting: Interventional Cardiology

## 2013-10-12 ENCOUNTER — Ambulatory Visit (INDEPENDENT_AMBULATORY_CARE_PROVIDER_SITE_OTHER): Payer: PRIVATE HEALTH INSURANCE | Admitting: Interventional Cardiology

## 2013-10-12 VITALS — BP 137/90 | HR 67 | Ht 69.0 in | Wt 200.0 lb

## 2013-10-12 DIAGNOSIS — I071 Rheumatic tricuspid insufficiency: Secondary | ICD-10-CM

## 2013-10-12 DIAGNOSIS — N183 Chronic kidney disease, stage 3 unspecified: Secondary | ICD-10-CM

## 2013-10-12 DIAGNOSIS — I10 Essential (primary) hypertension: Secondary | ICD-10-CM

## 2013-10-12 DIAGNOSIS — I079 Rheumatic tricuspid valve disease, unspecified: Secondary | ICD-10-CM

## 2013-10-12 DIAGNOSIS — I272 Pulmonary hypertension, unspecified: Secondary | ICD-10-CM

## 2013-10-12 DIAGNOSIS — I2789 Other specified pulmonary heart diseases: Secondary | ICD-10-CM

## 2013-10-12 NOTE — Patient Instructions (Signed)
Your physician recommends that you continue on your current medications as directed. Please refer to the Current Medication list given to you today.  LIMIT YOUR SODIUM INTAKE  Your physician wants you to follow-up in: 1 year You will receive a reminder letter in the mail two months in advance. If you don't receive a letter, please call our office to schedule the follow-up appointment.

## 2013-10-12 NOTE — Progress Notes (Signed)
Patient ID: [name] [name], male   DOB: [birth year], 80 y.o.   MRN: [id number]    [address] [place], [place]  [address] Phone: [phone] Fax:  [phone]  Date:  10/12/2013   ID:  [name] [name], DOB Mar 06, 1921, MRN [id number]  PCP:  [name], MD   ASSESSMENT:  1. Pulmonary hypertension, clinically stable 2. Chronic lower extremity edema secondary to pulmonary hypertension, venous obstructive disease, and dependency 3. Hypertension, controlled 4. History of DVT 5. Tricuspid regurgitation  PLAN:  1. there is no evidence of overt heart failure or decompensated pulmonary hypertension. Plan no change in current medical therapy.   SUBJECTIVE: [name] [name] is a 80 y.o. male who has no particular cardiovascular complaints. He is doing well. He is caring for his elderly wife who has Alzheimer's. He has not had syncope. He denies orthopnea, PND, and chest pain.   Wt Readings from Last 3 Encounters:  10/12/13 200 lb (90.719 kg)  07/03/13 185 lb (83.915 kg)  07/03/13 185 lb (83.915 kg)     Past Medical History  Diagnosis Date  . Hypertension   . Chronic kidney disease   . PAD (peripheral artery disease)   . Rheumatoid arthritis(714.0)   . CKD (chronic kidney disease), stage III   . Anemia   . Gout     Current Outpatient Prescriptions  Medication Sig Dispense Refill  . albuterol (PROVENTIL HFA;VENTOLIN HFA) 108 (90 BASE) MCG/ACT inhaler Inhale 2 puffs into the lungs every 6 (six) hours as needed for wheezing or shortness of breath.      . allopurinol (ZYLOPRIM) 300 MG tablet Take 150 mg by mouth daily.       . calcium-vitamin D (OSCAL WITH D) 500-200 MG-UNIT per tablet Take 1 tablet by mouth daily.       Marland Kitchen dextromethorphan-guaiFENesin (MUCINEX DM) 30-600 MG per 12 hr tablet Take 1 tablet by mouth daily.       . furosemide (LASIX) 20 MG tablet Take 20 mg by mouth daily.       . Glucosamine 500 MG CAPS Take 2 capsules by mouth daily.       .  methocarbamol (ROBAXIN) 500 MG tablet Take 500 mg by mouth 3 (three) times daily as needed for muscle spasms.      . methotrexate (RHEUMATREX) 2.5 MG tablet Take 7.5 mg by mouth once a week. Caution:Chemotherapy. Protect from light. Take on Monday      . Multiple Vitamin (MULTIVITAMIN) tablet Take 1 tablet by mouth daily.      . predniSONE (DELTASONE) 10 MG tablet Take 20 mg by mouth daily with breakfast.       . Probiotic Product (PROBIOTIC DAILY PO) Take 1 tablet by mouth daily.      . tamsulosin (FLOMAX) 0.4 MG CAPS capsule Take 0.4 mg by mouth at bedtime.      Marland Kitchen warfarin (COUMADIN) 2.5 MG tablet Take 2.5 mg by mouth daily. Take 2.5 mg on Monday and Saturday.      . warfarin (COUMADIN) 5 MG tablet Take 5 mg by mouth daily. Take 5 mg on Tuesday, Wednesday, Thursday, Friday, and Sunday      . Acai 25 MG CAPS Take 1 capsule by mouth daily.      Marland Kitchen albuterol (PROVENTIL HFA;VENTOLIN HFA) 108 (90 BASE) MCG/ACT inhaler Inhale 2 puffs into the lungs every 6 (six) hours as needed for wheezing.  1 Inhaler  0   No current facility-administered medications for  this visit.    Allergies:    Allergies  Allergen Reactions  . Iohexol      Code: RASH, Desc: PT STATES HE HAD A REACTION TO IV DYE 06/29/06/RM, Onset Date: [id number]     Social History:  The patient  reports that he has quit smoking. He has never used smokeless tobacco. He reports that he does not drink alcohol or use illicit drugs.   ROS:  Please see the history of present illness.   Denies weight loss   All other systems reviewed and negative.   OBJECTIVE: VS:  BP 137/90  Pulse 67  Ht 5\' 9"  (1.753 m)  Wt 200 lb (90.719 kg)  BMI 29.52 kg/m2 Well nourished, well developed, in no acute distress, elderly but appears than stated age HEENT: normal Neck: JVD flat. Carotid bruit absent  Cardiac:  normal S1, S2; RRR; no murmur Lungs:  clear to auscultation bilaterally, no wheezing, rhonchi or rales Abd: soft, nontender, no hepatomegaly Ext:  Edema 1-2+ bilateral. Pulses 2+ Skin: warm and dry Neuro:  CNs 2-12 intact, no focal abnormalities noted  EKG:  Not repeated       Signed, III, MD 10/12/2013 1:45 PM

## 2013-12-31 ENCOUNTER — Ambulatory Visit (INDEPENDENT_AMBULATORY_CARE_PROVIDER_SITE_OTHER): Payer: Medicare HMO | Admitting: Interventional Cardiology

## 2013-12-31 ENCOUNTER — Ambulatory Visit
Admission: RE | Admit: 2013-12-31 | Discharge: 2013-12-31 | Disposition: A | Discharge status: Home / Self Care | Payer: Medicare HMO | Source: Ambulatory Visit | Attending: Interventional Cardiology | Admitting: Interventional Cardiology

## 2013-12-31 ENCOUNTER — Telehealth: Payer: Self-pay

## 2013-12-31 ENCOUNTER — Encounter: Payer: Self-pay | Admitting: Interventional Cardiology

## 2013-12-31 VITALS — BP 110/72 | HR 93 | Ht 69.0 in | Wt 201.1 lb

## 2013-12-31 DIAGNOSIS — N183 Chronic kidney disease, stage 3 unspecified: Secondary | ICD-10-CM

## 2013-12-31 DIAGNOSIS — R0600 Dyspnea, unspecified: Secondary | ICD-10-CM

## 2013-12-31 DIAGNOSIS — R0989 Other specified symptoms and signs involving the circulatory and respiratory systems: Secondary | ICD-10-CM

## 2013-12-31 DIAGNOSIS — R7989 Other specified abnormal findings of blood chemistry: Secondary | ICD-10-CM

## 2013-12-31 DIAGNOSIS — R0609 Other forms of dyspnea: Secondary | ICD-10-CM

## 2013-12-31 DIAGNOSIS — I272 Pulmonary hypertension, unspecified: Secondary | ICD-10-CM

## 2013-12-31 DIAGNOSIS — R0602 Shortness of breath: Secondary | ICD-10-CM

## 2013-12-31 DIAGNOSIS — I10 Essential (primary) hypertension: Secondary | ICD-10-CM

## 2013-12-31 DIAGNOSIS — I2789 Other specified pulmonary heart diseases: Secondary | ICD-10-CM

## 2013-12-31 LAB — CBC WITH DIFFERENTIAL/PLATELET
BASOS PCT: 0 % (ref 0.0–3.0)
Basophils Absolute: 0 10*3/uL (ref 0.0–0.1)
EOS PCT: 0 % (ref 0.0–5.0)
Eosinophils Absolute: 0 10*3/uL (ref 0.0–0.7)
HCT: 35.7 % — ABNORMAL LOW (ref 39.0–52.0)
Hemoglobin: 11.7 g/dL — ABNORMAL LOW (ref 13.0–17.0)
Lymphocytes Relative: 6.2 % — ABNORMAL LOW (ref 12.0–46.0)
Lymphs Abs: 0.4 10*3/uL — ABNORMAL LOW (ref 0.7–4.0)
MCHC: 32.7 g/dL (ref 30.0–36.0)
MCV: 91.2 fl (ref 78.0–100.0)
Monocytes Absolute: 0.5 10*3/uL (ref 0.1–1.0)
Monocytes Relative: 6.8 % (ref 3.0–12.0)
Neutro Abs: 5.9 10*3/uL (ref 1.4–7.7)
Neutrophils Relative %: 87 % — ABNORMAL HIGH (ref 43.0–77.0)
Platelets: 175 10*3/uL (ref 150.0–400.0)
RBC: 3.91 Mil/uL — AB (ref 4.22–5.81)
RDW: 18.8 % — ABNORMAL HIGH (ref 11.5–15.5)
WBC: 6.7 10*3/uL (ref 4.0–10.5)

## 2013-12-31 LAB — BASIC METABOLIC PANEL
BUN: 28 mg/dL — ABNORMAL HIGH (ref 6–23)
CALCIUM: 9 mg/dL (ref 8.4–10.5)
CHLORIDE: 103 meq/L (ref 96–112)
CO2: 26 meq/L (ref 19–32)
Creatinine, Ser: 1.5 mg/dL (ref 0.4–1.5)
GFR: 55.58 mL/min — ABNORMAL LOW (ref >60.00)
Glucose, Bld: 107 mg/dL — ABNORMAL HIGH (ref 70–99)
Potassium: 4.4 mEq/L (ref 3.5–5.1)
SODIUM: 137 meq/L (ref 135–145)

## 2013-12-31 LAB — D-DIMER, QUANTITATIVE (NOT AT ARMC): D DIMER QUANT: 3.1 ug{FEU}/mL — AB (ref 0.00–0.48)

## 2013-12-31 LAB — BRAIN NATRIURETIC PEPTIDE: PRO B NATRI PEPTIDE: 664 pg/mL — AB (ref 0.0–100.0)

## 2013-12-31 MED ORDER — FUROSEMIDE 40 MG PO TABS: 20.0000 mg | ORAL_TABLET | Freq: Every day | ORAL | Status: DC

## 2013-12-31 NOTE — Progress Notes (Signed)
Patient ID: [name] [name], male   DOB: [birth year], 80 y.o.   MRN: [id number]    [address] [place], [place]  [address] Phone: [phone] Fax:  [phone]  Date:  12/31/2013   ID:  [name] [name], DOB 1921/01/27, MRN [id number]  PCP:  [name], MD   ASSESSMENT:  1. Dyspnea with effort and with bending 2. History of pulmonary hypertension 3. Chronic kidney disease 4. History of systemic arterial hypertension 5. History of anemia  PLAN:  1. D-dimer, BNP, basic metabolic panel, CBC 2. 2-D Doppler echocardiogram 3. PA and lateral chest x-ray 4. Increase furosemide to 40 mg daily rather than   SUBJECTIVE: [name] [name] is a 80 y.o. male  4-6 week history of exertional dyspnea, dyspnea when bending, and decreased exertional tolerance. He denies orthopnea and chest discomfort. He has not had syncope. He has chronic lower extremity swelling. There is a history of pulmonary hypertension. His appetite is been stable. He denies palpitations.   Wt Readings from Last 3 Encounters:  12/31/13 201 lb 1.9 oz (91.227 kg)  10/12/13 200 lb (90.719 kg)  07/03/13 185 lb (83.915 kg)     Past Medical History  Diagnosis Date  . Hypertension   . Chronic kidney disease   . PAD (peripheral artery disease)   . Rheumatoid arthritis(714.0)   . CKD (chronic kidney disease), stage III   . Anemia   . Gout     Current Outpatient Prescriptions  Medication Sig Dispense Refill  . Acai 25 MG CAPS Take 1 capsule by mouth daily.      Marland Kitchen albuterol (PROVENTIL HFA;VENTOLIN HFA) 108 (90 BASE) MCG/ACT inhaler Inhale 2 puffs into the lungs every 6 (six) hours as needed for wheezing or shortness of breath.      . allopurinol (ZYLOPRIM) 300 MG tablet Take 150 mg by mouth daily.       . calcium-vitamin D (OSCAL WITH D) 500-200 MG-UNIT per tablet Take 1 tablet by mouth daily.       . cephALEXin (KEFLEX) 500 MG capsule Take 500 mg by mouth 2 (two) times daily.      . furosemide (LASIX)  20 MG tablet Take 20 mg by mouth daily.       . Glucosamine 500 MG CAPS Take 2 capsules by mouth daily.       Marland Kitchen guaifenesin (HUMIBID E) 400 MG TABS tablet Take 400 mg by mouth 2 (two) times daily as needed.      . methocarbamol (ROBAXIN) 500 MG tablet Take 500 mg by mouth 3 (three) times daily as needed for muscle spasms.      . methotrexate (RHEUMATREX) 2.5 MG tablet Take 7.5 mg by mouth once a week. Caution:Chemotherapy. Protect from light. Take on Monday      . Multiple Vitamin (MULTIVITAMIN) tablet Take 1 tablet by mouth daily.      . predniSONE (DELTASONE) 10 MG tablet Take 20 mg by mouth daily with breakfast.       . Probiotic Product (PROBIOTIC DAILY PO) Take 1 tablet by mouth daily.      . tamsulosin (FLOMAX) 0.4 MG CAPS capsule Take 0.4 mg by mouth at bedtime.      Marland Kitchen warfarin (COUMADIN) 2.5 MG tablet Take 2.5 mg on Sunday, Tuesday, Thursday and Saturday      . warfarin (COUMADIN) 5 MG tablet Take 5 mg on Monday, Wednesday & Friday       No current facility-administered medications for this  visit.    Allergies:    Allergies  Allergen Reactions  . Iohexol      Code: RASH, Desc: PT STATES HE HAD A REACTION TO IV DYE 06/29/06/RM, Onset Date: [id number]     Social History:  The patient  reports that he has quit smoking. He has never used smokeless tobacco. He reports that he does not drink alcohol or use illicit drugs.   ROS:  Please see the history of present illness.   Appetite is stable. He feels that there is increasing abdominal girth. He is accompanied by his daughter. She also states that he coughs he brings up phlegm.   All other systems reviewed and negative.   OBJECTIVE: VS:  BP 110/72  Pulse 93  Ht 5\' 9"  (1.753 m)  Wt 201 lb 1.9 oz (91.227 kg)  BMI 29.69 kg/m2 Well nourished, well developed, in no acute distress, elderly and frail HEENT: normal Neck: JVD moderate elevation. Carotid bruit absent  Cardiac:  normal S1, S2; RRR; no murmur2. Possible summation gallop Lungs:   clear to auscultation bilaterally, no wheezing, rhonchi or rales Abd: soft, nontender, no hepatomegaly Ext: Edema 2+ bilateral. Pulses diminished in the lower extremities Skin: warm and dry Neuro:  CNs 2-12 intact, no focal abnormalities noted  EKG:  Normal sinus rhythm, poor R-wave progression, by atrial abnormality, interventricular conduction delay. No acute change       Signed, III, MD 12/31/2013 2:03 PM

## 2013-12-31 NOTE — Patient Instructions (Addendum)
Your physician has recommended you make the following change in your medication: 1) INCREASE Furosemide to 40mg  daily. An Rx has been sent to your pharmacy  Take all other medications as prescribed  Lab Today: Bmet, Bnp, Cbc,D-dimer  A chest x-ray takes a picture of the organs and structures inside the chest, including the heart, lungs, and blood vessels. This test can show several things, including, whether the heart is enlarges; whether fluid is building up in the lungs; and whether pacemaker / defibrillator leads are still in place.( To be done at [facility] [address] on the 1st floor    Your physician has requested that you have an echocardiogram. Echocardiography is a painless test that uses sound waves to create images of your heart. It provides your doctor with information about the size and shape of your heart and how well your heart's chambers and valves are working. This procedure takes approximately one hour. There are no restrictions for this procedure.   Your physician recommends that you schedule a follow-up appointment on 01/24/14 @ 3pm

## 2013-12-31 NOTE — Telephone Encounter (Signed)
Message copied by Jarvis Newcomer on Mon Dec 31, 2013  5:19 PM ------      Message from: Verdis Prime      Created: Mon Dec 31, 2013  5:01 PM       Okay for CT angio of lungs to r/o PE. Do not increase the lasix to 40 mg daily until after we do CT scan. If clots present he will not need to increase the furosemide. ------

## 2014-01-01 ENCOUNTER — Ambulatory Visit (HOSPITAL_COMMUNITY): Payer: Medicare HMO | Attending: Cardiology | Admitting: Radiology

## 2014-01-01 DIAGNOSIS — R0602 Shortness of breath: Secondary | ICD-10-CM | POA: Diagnosis present

## 2014-01-01 DIAGNOSIS — I080 Rheumatic disorders of both mitral and aortic valves: Secondary | ICD-10-CM | POA: Insufficient documentation

## 2014-01-01 DIAGNOSIS — R0600 Dyspnea, unspecified: Secondary | ICD-10-CM

## 2014-01-01 NOTE — Telephone Encounter (Signed)
pt adv per Dr.Smith pt to continue original dosage of furosemide 20mg , do not start increase until CT results comes back. pt adv that since he is allergic to contrast dye he will need to be pre-mediacted 13 hours prior to CT.pt aware CT appt scheduled for 10/12 @10 :15.pt  is scheduled to come in to the office today @10am  for an echo. Adv pt that pre-medication written instructions will be provided.Rx for CT outpt premed protocol called in to pt pharmacy

## 2014-01-01 NOTE — Progress Notes (Signed)
Echocardiogram performed.  

## 2014-01-01 NOTE — Telephone Encounter (Signed)
Pre medication for CT with contrast called in to pt pharmacy 7:15pm -prednisone 50mg  po 1:15am -prednisone 50mg  po 7:15am -prednisone 50mg  po  7:15am -benadryl 50mg  po

## 2014-01-02 ENCOUNTER — Ambulatory Visit (INDEPENDENT_AMBULATORY_CARE_PROVIDER_SITE_OTHER)
Admission: RE | Admit: 2014-01-02 | Discharge: 2014-01-02 | Disposition: A | Discharge status: Home / Self Care | Payer: Medicare HMO | Source: Ambulatory Visit | Attending: Interventional Cardiology | Admitting: Interventional Cardiology

## 2014-01-02 DIAGNOSIS — R791 Abnormal coagulation profile: Secondary | ICD-10-CM

## 2014-01-02 DIAGNOSIS — R7989 Other specified abnormal findings of blood chemistry: Secondary | ICD-10-CM

## 2014-01-02 DIAGNOSIS — R0602 Shortness of breath: Secondary | ICD-10-CM

## 2014-01-02 MED ORDER — FUROSEMIDE 40 MG PO TABS: 40.0000 mg | ORAL_TABLET | Freq: Every day | ORAL | Status: DC

## 2014-01-02 MED ORDER — IOHEXOL 350 MG/ML SOLN
80.0000 mL | Freq: Once | INTRAVENOUS | Status: AC | PRN
  Administered 2014-01-02: 80 mL via INTRAVENOUS

## 2014-01-02 NOTE — Telephone Encounter (Signed)
pt aware CT negative for PE. pt should start furosemide increase 40mg  daily.pt verbalized understanding

## 2014-01-02 NOTE — Telephone Encounter (Signed)
Pt CT is negative for a PE. Will route to Dr.Smith to review

## 2014-01-02 NOTE — Telephone Encounter (Signed)
Please start Furosemide 40 mg daily as we planned.  No PE noted

## 2014-01-07 ENCOUNTER — Telehealth: Payer: Self-pay | Admitting: Interventional Cardiology

## 2014-01-07 NOTE — Telephone Encounter (Signed)
Spoke with patient. Pt states SOB has improved. His states his main concern today is that he developed back pain above his hips starting last Friday.

## 2014-01-07 NOTE — Telephone Encounter (Signed)
Pt states that his back does not hurt at rest but when he tries to get up or change position it hurts. Pt advised to see his PCP or Urgent Care for follow-up today.

## 2014-01-07 NOTE — Telephone Encounter (Signed)
New Message  Pt called states that he was seen previously however he still having severe symptoms such as Continual SOB// Back pain//.. Pt states that he will need a same day/ morning appt. Please call

## 2014-01-12 ENCOUNTER — Inpatient Hospital Stay (HOSPITAL_COMMUNITY)
Admission: EM | Admit: 2014-01-12 | Discharge: 2014-01-18 | DRG: 292 | Disposition: A | Discharge status: Home Health | Payer: Medicare HMO | Attending: Cardiology | Admitting: Cardiology

## 2014-01-12 ENCOUNTER — Encounter (HOSPITAL_COMMUNITY): Payer: Self-pay | Admitting: Emergency Medicine

## 2014-01-12 ENCOUNTER — Emergency Department (HOSPITAL_COMMUNITY): Payer: Medicare HMO

## 2014-01-12 DIAGNOSIS — J449 Chronic obstructive pulmonary disease, unspecified: Secondary | ICD-10-CM | POA: Diagnosis present

## 2014-01-12 DIAGNOSIS — R4181 Age-related cognitive decline: Secondary | ICD-10-CM | POA: Diagnosis present

## 2014-01-12 DIAGNOSIS — N183 Chronic kidney disease, stage 3 unspecified: Secondary | ICD-10-CM | POA: Diagnosis present

## 2014-01-12 DIAGNOSIS — M12859 Other specific arthropathies, not elsewhere classified, unspecified hip: Secondary | ICD-10-CM | POA: Diagnosis present

## 2014-01-12 DIAGNOSIS — I10 Essential (primary) hypertension: Secondary | ICD-10-CM | POA: Diagnosis present

## 2014-01-12 DIAGNOSIS — Y9210 Unspecified residential institution as the place of occurrence of the external cause: Secondary | ICD-10-CM | POA: Diagnosis not present

## 2014-01-12 DIAGNOSIS — Z860100 Personal history of colon polyps, unspecified: Secondary | ICD-10-CM

## 2014-01-12 DIAGNOSIS — Z87891 Personal history of nicotine dependence: Secondary | ICD-10-CM

## 2014-01-12 DIAGNOSIS — S98119A Complete traumatic amputation of unspecified great toe, initial encounter: Secondary | ICD-10-CM | POA: Diagnosis not present

## 2014-01-12 DIAGNOSIS — T502X5A Adverse effect of carbonic-anhydrase inhibitors, benzothiadiazides and other diuretics, initial encounter: Secondary | ICD-10-CM | POA: Diagnosis not present

## 2014-01-12 DIAGNOSIS — R0600 Dyspnea, unspecified: Secondary | ICD-10-CM

## 2014-01-12 DIAGNOSIS — I509 Heart failure, unspecified: Secondary | ICD-10-CM | POA: Diagnosis present

## 2014-01-12 DIAGNOSIS — G5790 Unspecified mononeuropathy of unspecified lower limb: Secondary | ICD-10-CM | POA: Diagnosis present

## 2014-01-12 DIAGNOSIS — I739 Peripheral vascular disease, unspecified: Secondary | ICD-10-CM | POA: Diagnosis present

## 2014-01-12 DIAGNOSIS — I059 Rheumatic mitral valve disease, unspecified: Secondary | ICD-10-CM

## 2014-01-12 DIAGNOSIS — I129 Hypertensive chronic kidney disease with stage 1 through stage 4 chronic kidney disease, or unspecified chronic kidney disease: Secondary | ICD-10-CM | POA: Diagnosis present

## 2014-01-12 DIAGNOSIS — M5430 Sciatica, unspecified side: Secondary | ICD-10-CM | POA: Diagnosis present

## 2014-01-12 DIAGNOSIS — I130 Hypertensive heart and chronic kidney disease with heart failure and stage 1 through stage 4 chronic kidney disease, or unspecified chronic kidney disease: Secondary | ICD-10-CM

## 2014-01-12 DIAGNOSIS — E876 Hypokalemia: Secondary | ICD-10-CM | POA: Diagnosis not present

## 2014-01-12 DIAGNOSIS — Z86718 Personal history of other venous thrombosis and embolism: Secondary | ICD-10-CM | POA: Diagnosis not present

## 2014-01-12 DIAGNOSIS — I2789 Other specified pulmonary heart diseases: Secondary | ICD-10-CM | POA: Diagnosis present

## 2014-01-12 DIAGNOSIS — I428 Other cardiomyopathies: Secondary | ICD-10-CM | POA: Diagnosis present

## 2014-01-12 DIAGNOSIS — I5043 Acute on chronic combined systolic (congestive) and diastolic (congestive) heart failure: Principal | ICD-10-CM | POA: Diagnosis present

## 2014-01-12 DIAGNOSIS — I5023 Acute on chronic systolic (congestive) heart failure: Secondary | ICD-10-CM | POA: Diagnosis present

## 2014-01-12 DIAGNOSIS — M069 Rheumatoid arthritis, unspecified: Secondary | ICD-10-CM | POA: Diagnosis present

## 2014-01-12 DIAGNOSIS — I5021 Acute systolic (congestive) heart failure: Secondary | ICD-10-CM

## 2014-01-12 DIAGNOSIS — M109 Gout, unspecified: Secondary | ICD-10-CM | POA: Diagnosis present

## 2014-01-12 DIAGNOSIS — Z515 Encounter for palliative care: Secondary | ICD-10-CM

## 2014-01-12 DIAGNOSIS — J4489 Other specified chronic obstructive pulmonary disease: Secondary | ICD-10-CM | POA: Diagnosis present

## 2014-01-12 DIAGNOSIS — Z7901 Long term (current) use of anticoagulants: Secondary | ICD-10-CM | POA: Diagnosis not present

## 2014-01-12 HISTORY — DX: Chronic obstructive pulmonary disease, unspecified: J44.9

## 2014-01-12 HISTORY — DX: Acute embolism and thrombosis of unspecified deep veins of unspecified lower extremity: I82.409

## 2014-01-12 HISTORY — DX: Long term (current) use of anticoagulants: Z79.01

## 2014-01-12 LAB — BASIC METABOLIC PANEL
Anion gap: 16 — ABNORMAL HIGH (ref 5–15)
BUN: 29 mg/dL — AB (ref 6–23)
CHLORIDE: 96 meq/L (ref 96–112)
CO2: 24 mEq/L (ref 19–32)
CREATININE: 1.55 mg/dL — AB (ref 0.50–1.35)
Calcium: 9 mg/dL (ref 8.4–10.5)
GFR calc non Af Amer: 38 mL/min — ABNORMAL LOW (ref >90)
GFR, EST AFRICAN AMERICAN: 44 mL/min — AB (ref >90)
GLUCOSE: 118 mg/dL — AB (ref 70–99)
Potassium: 4.9 mEq/L (ref 3.7–5.3)
Sodium: 136 mEq/L — ABNORMAL LOW (ref 137–147)

## 2014-01-12 LAB — I-STAT TROPONIN, ED: Troponin i, poc: 0.02 ng/mL (ref 0.00–0.08)

## 2014-01-12 LAB — CBC
HCT: 33.4 % — ABNORMAL LOW (ref 39.0–52.0)
HEMATOCRIT: 32.9 % — AB (ref 39.0–52.0)
Hemoglobin: 11.5 g/dL — ABNORMAL LOW (ref 13.0–17.0)
Hemoglobin: 11.6 g/dL — ABNORMAL LOW (ref 13.0–17.0)
MCH: 29.4 pg (ref 26.0–34.0)
MCH: 29.3 pg (ref 26.0–34.0)
MCHC: 35 g/dL (ref 30.0–36.0)
MCHC: 34.7 g/dL (ref 30.0–36.0)
MCV: 84.1 fL (ref 78.0–100.0)
MCV: 84.3 fL (ref 78.0–100.0)
Platelets: 170 10*3/uL (ref 150–400)
Platelets: 147 10*3/uL — ABNORMAL LOW (ref 150–400)
RBC: 3.91 MIL/uL — AB (ref 4.22–5.81)
RBC: 3.96 MIL/uL — AB (ref 4.22–5.81)
RDW: 16.6 % — ABNORMAL HIGH (ref 11.5–15.5)
RDW: 16.5 % — ABNORMAL HIGH (ref 11.5–15.5)
WBC: 5.4 10*3/uL (ref 4.0–10.5)
WBC: 4.7 10*3/uL (ref 4.0–10.5)

## 2014-01-12 LAB — CREATININE, SERUM
CREATININE: 1.55 mg/dL — AB (ref 0.50–1.35)
GFR calc Af Amer: 44 mL/min — ABNORMAL LOW (ref >90)
GFR, EST NON AFRICAN AMERICAN: 38 mL/min — AB (ref >90)

## 2014-01-12 LAB — TROPONIN I

## 2014-01-12 LAB — PRO B NATRIURETIC PEPTIDE: PRO B NATRI PEPTIDE: 2309 pg/mL — AB (ref 0–450)

## 2014-01-12 LAB — PROTIME-INR
INR: 2.59 — AB (ref 0.00–1.49)
PROTHROMBIN TIME: 27.8 s — AB (ref 11.6–15.2)

## 2014-01-12 LAB — TSH: TSH: 1.56 u[IU]/mL (ref 0.350–4.500)

## 2014-01-12 MED ORDER — SODIUM CHLORIDE 0.9 % IV SOLN: 250.0000 mL | INTRAVENOUS | Status: DC | PRN

## 2014-01-12 MED ORDER — SODIUM CHLORIDE 0.9 % IJ SOLN: 3.0000 mL | INTRAMUSCULAR | Status: DC | PRN

## 2014-01-12 MED ORDER — HEPARIN SODIUM (PORCINE) 5000 UNIT/ML IJ SOLN
5000.0000 [IU] | Freq: Three times a day (TID) | INTRAMUSCULAR | Status: DC
  Administered 2014-01-12: 5000 [IU] via SUBCUTANEOUS
  Filled 2014-01-12: qty 1

## 2014-01-12 MED ORDER — ONDANSETRON HCL 4 MG/2ML IJ SOLN: 4.0000 mg | Freq: Four times a day (QID) | INTRAMUSCULAR | Status: DC | PRN

## 2014-01-12 MED ORDER — WARFARIN SODIUM 2.5 MG PO TABS: 2.5000 mg | ORAL_TABLET | Freq: Every morning | ORAL | Status: DC

## 2014-01-12 MED ORDER — IPRATROPIUM-ALBUTEROL 0.5-2.5 (3) MG/3ML IN SOLN
3.0000 mL | RESPIRATORY_TRACT | Status: AC
  Administered 2014-01-12: 3 mL via RESPIRATORY_TRACT
  Filled 2014-01-12: qty 3

## 2014-01-12 MED ORDER — PREDNISONE 20 MG PO TABS
20.0000 mg | ORAL_TABLET | Freq: Every day | ORAL | Status: DC
  Administered 2014-01-13 – 2014-01-18 (×6): 20 mg via ORAL
  Filled 2014-01-12 (×7): qty 1

## 2014-01-12 MED ORDER — DM-GUAIFENESIN ER 30-600 MG PO TB12
1.0000 | ORAL_TABLET | Freq: Two times a day (BID) | ORAL | Status: DC
  Administered 2014-01-13 – 2014-01-16 (×7): 1 via ORAL
  Filled 2014-01-12 (×9): qty 1

## 2014-01-12 MED ORDER — METHOCARBAMOL 500 MG PO TABS
500.0000 mg | ORAL_TABLET | Freq: Three times a day (TID) | ORAL | Status: DC | PRN
  Filled 2014-01-12: qty 1

## 2014-01-12 MED ORDER — ALBUTEROL SULFATE (2.5 MG/3ML) 0.083% IN NEBU: 3.0000 mL | INHALATION_SOLUTION | Freq: Four times a day (QID) | RESPIRATORY_TRACT | Status: DC | PRN

## 2014-01-12 MED ORDER — METHOTREXATE 2.5 MG PO TABS
7.5000 mg | ORAL_TABLET | ORAL | Status: DC
  Administered 2014-01-14: 7.5 mg via ORAL
  Filled 2014-01-12: qty 3

## 2014-01-12 MED ORDER — FUROSEMIDE 10 MG/ML IJ SOLN
40.0000 mg | Freq: Two times a day (BID) | INTRAMUSCULAR | Status: DC
  Administered 2014-01-12 – 2014-01-17 (×10): 40 mg via INTRAVENOUS
  Filled 2014-01-12 (×12): qty 4

## 2014-01-12 MED ORDER — FUROSEMIDE 10 MG/ML IJ SOLN
40.0000 mg | Freq: Once | INTRAMUSCULAR | Status: AC
  Administered 2014-01-12: 40 mg via INTRAVENOUS
  Filled 2014-01-12: qty 4

## 2014-01-12 MED ORDER — ACETAMINOPHEN 325 MG PO TABS: 650.0000 mg | ORAL_TABLET | ORAL | Status: DC | PRN

## 2014-01-12 MED ORDER — SODIUM CHLORIDE 0.9 % IJ SOLN
3.0000 mL | Freq: Two times a day (BID) | INTRAMUSCULAR | Status: DC
  Administered 2014-01-12 – 2014-01-18 (×12): 3 mL via INTRAVENOUS

## 2014-01-12 MED ORDER — HYDROCODONE-ACETAMINOPHEN 5-325 MG PO TABS: 1.0000 | ORAL_TABLET | Freq: Four times a day (QID) | ORAL | Status: DC | PRN

## 2014-01-12 NOTE — ED Notes (Signed)
Dr. Yao at the bedside. 

## 2014-01-12 NOTE — H&P (Signed)
CARDIOLOGY ADMISSION NOTE  Patient ID: [name] [name] MRN: [id number] DOB/AGE: 1920-11-25 80 y.o.  Admit date: 01/12/2014 Primary Physician   [name], MD Primary Cardiologist   Dr. Garnette Scheuermann Chief Complaint    Dyspnea  HPI:  The patient has a history of pulmonary HTN.   He saw Dr. Katrinka Blazing a few days ago for dyspnea.  ProBNP was slightly elevated.   D dimer was elevated.   Echo demonstrated a cardiomyopathy with an EF of 30 - 35%.  There was akinesis of the inferolateral and inferior myocardium.  There was otherwise diffuse hypokinesis. He had severely elevated pulmonary pressures and severe MR.  He presents today for SOB.    He reports that his breathing has been short for over a month.  He says however that he has had PND a couple of night this week.  He has had increased SOB walking through his house.  He thought today that his breathing was going to "cut off".  He had his son bring him to the ED.  BNP was elevated at 2309.  CXR does not suggest edema.  He does have emphysema on a recent CT and chest Xray.  He has no pulmonary diagnosis other than the mention of elevated pressures.  However, he was scheduled to see a pulmonologist.  He is not having fevers or chills.  He has had some vague chest and back pain.  He has had no further weight gain and no increase in edema.  Of note the CT done recently also showed coronary calcium.  He has no history of CAD.     Past Medical History  Diagnosis Date  . Hypertension   . Chronic kidney disease   . PAD (peripheral artery disease)   . Rheumatoid arthritis(714.0)   . CKD (chronic kidney disease), stage III   . Anemia   . Gout   . COPD (chronic obstructive pulmonary disease)   . DVT, lower extremity     Past Surgical History  Procedure Laterality Date  . Hernia repair  1982  . Hernia repair    . Amputation  05/02/2011    Procedure: AMPUTATION DIGIT;  Surgeon: Kathryne Hitch;  Location: [facility] OR;  Service: Orthopedics;   Laterality: Left;  left great toe  . Colonoscopy w/ biopsies and polypectomy      Hx: of  . Amputation Left 07/03/2013    Procedure: LEFT 2ND TOE AMPUTATION ;  Surgeon: Kathryne Hitch, MD;  Location: [facility] OR;  Service: Orthopedics;  Laterality: Left;    Allergies  Allergen Reactions  . Iohexol Itching and Rash        No current facility-administered medications on file prior to encounter.   Current Outpatient Prescriptions on File Prior to Encounter  Medication Sig Dispense Refill  . Acai 25 MG CAPS Take 1 capsule by mouth daily.      Marland Kitchen albuterol (PROVENTIL HFA;VENTOLIN HFA) 108 (90 BASE) MCG/ACT inhaler Inhale 2 puffs into the lungs every 6 (six) hours as needed for wheezing or shortness of breath.      . calcium-vitamin D (OSCAL WITH D) 500-200 MG-UNIT per tablet Take 1 tablet by mouth daily.       . furosemide (LASIX) 40 MG tablet Take 1 tablet (40 mg total) by mouth daily.      . methocarbamol (ROBAXIN) 500 MG tablet Take 500 mg by mouth 3 (three) times daily as needed for muscle spasms.      . methotrexate (RHEUMATREX) 2.5  MG tablet Take 7.5 mg by mouth every Monday. Caution:Chemotherapy. Protect from light.      . Multiple Vitamin (MULTIVITAMIN) tablet Take 1 tablet by mouth daily.      . predniSONE (DELTASONE) 10 MG tablet Take 20 mg by mouth daily with breakfast.       . Probiotic Product (PROBIOTIC DAILY PO) Take 1 tablet by mouth daily.       History   Social History  . Marital Status: Married    Spouse Name: N/A    Number of Children: N/A  . Years of Education: N/A   Occupational History  . Not on file.   Social History Main Topics  . Smoking status: Former Games developer  . Smokeless tobacco: Never Used  . Alcohol Use: No  . Drug Use: No  . Sexual Activity: No   Other Topics Concern  . Not on file   Social History Narrative  . No narrative on file    Family History  Problem Relation Age of Onset  . Hypertension Mother   . Heart disease Father      ROS:   Back pain , hip pain, cough.  Otherwise as stated in the HPI and negative for all other systems.  Physical Exam: Blood pressure 121/84, pulse 95, temperature 98.3 F (36.8 C), temperature source Oral, resp. rate 18, height 5\' 9"  (1.753 m), weight 201 lb 3 oz (91.258 kg), SpO2 95.00%.  GENERAL:  Looks younger than stated age HEENT:  Pupils equal round and reactive, fundi not visualized, oral mucosa unremarkable NECK:  Positive jugular venous distention, waveform within normal limits, carotid upstroke brisk and symmetric, no bruits, no thyromegaly LYMPHATICS:  No cervical, inguinal adenopathy LUNGS:  Clear to auscultation bilaterally BACK:  No CVA tenderness CHEST:  Decreased breath sounds, mild wheezing HEART:  PMI not displaced or sustained,S1 and S2 within normal limits, no S3, no S4, no clicks, no rubs, no murmurs,  ABD:  Flat, positive bowel sounds normal in frequency in pitch, no bruits, no rebound, no guarding, no midline pulsatile mass, positive hepatomegaly, no splenomegaly EXT:  2 plus pulses upper, decreased DP/PT left leg, left great toe amputation, mild ankle edema, no cyanosis no clubbing SKIN:  No rashes no nodules NEURO:  Cranial nerves II through XII grossly intact, motor grossly intact throughout PSYCH:  Cognitively intact, oriented to person place and time   Labs: Lab Results  Component Value Date   BUN 29* 01/12/2014   Lab Results  Component Value Date   CREATININE 1.55* 01/12/2014   Lab Results  Component Value Date   NA 136* 01/12/2014   K 4.9 01/12/2014   CL 96 01/12/2014   CO2 24 01/12/2014    Lab Results  Component Value Date   WBC 5.4 01/12/2014   HGB 11.5* 01/12/2014   HCT 32.9* 01/12/2014   MCV 84.1 01/12/2014   PLT 170 01/12/2014     Radiology:  CXR:  Underlying emphysematous change. Heart is mildly enlarged. No edema  or consolidation.  EKG:  Sinus tachycardia with rate 102, possible anteroseptal infarct old, PACs, no acute ST T wave  changes  ASSESSMENT AND PLAN:    DYSPNEA:  Acute systolic HF.  Severe MR.  Reduced EF.  I don't know if any of this is new.  I will not start ACE inhibitor because of CKD.  I will avoid a beta blocker with slight wheezing.  BP is OK.  I will continue gentle IV diuresis.  CKD:  Follow creat  closely.    MR:  As above and below  CM:  I suspect ischemic.  I would suggest right and left heart cath.  DVT:  I will stop the warfarin and start heparin (in anticipation of cath if renal function allows).  He will need an INR first.    Signed: Rollene Rotunda 01/12/2014, 5:47 PM

## 2014-01-12 NOTE — ED Notes (Signed)
Report given to Jessica, RN.

## 2014-01-12 NOTE — ED Notes (Signed)
Pt transported to xray 

## 2014-01-12 NOTE — ED Notes (Signed)
Patient returned from to X-ray. 

## 2014-01-12 NOTE — ED Notes (Addendum)
Family states his PCP started him on lasix 8/10 for SOB but symptoms have not improved and he has been more SOB this week. He states hes felt "some tightness" in his chest when breathing at times but denies any now. Hes been using combivent at home with no relief. He denies cough, fevers, edema.

## 2014-01-12 NOTE — ED Provider Notes (Signed)
CSN: [id number]     Arrival date & time 01/12/14  1430 History   First MD Initiated Contact with Patient 01/12/14 1506     Chief Complaint  Patient presents with  . Shortness of Breath     (Consider location/radiation/quality/duration/timing/severity/associated sxs/prior Treatment) Patient is a 80 y.o. male presenting with shortness of breath. The history is provided by the patient.  Shortness of Breath Severity:  Moderate Onset quality:  Gradual Duration:  2 weeks Timing:  Intermittent Progression:  Waxing and waning Chronicity:  New Context: not known allergens   Relieved by:  Nothing Worsened by:  Nothing tried Ineffective treatments:  None tried Associated symptoms: no abdominal pain, no chest pain, no fever, no headaches, no rash and no vomiting   Risk factors: hx of PE/DVT    80 yo M with a chief complaint shortness breath. Patient states that on for the past 2 weeks. Patient states that it comes and goes. Associated with some tightness in his chest. Patient is concerned that this may be the left breast that he takes. Denies any increased leg swelling. Patient with orthopnea over the past week and half. Recent increase in Lasix by his cardiologist. Patient also had he CT PE study done about 10 days ago that was negative. Currently being treated for a DVT.  Past Medical History  Diagnosis Date  . Hypertension   . Chronic kidney disease   . PAD (peripheral artery disease)   . Rheumatoid arthritis(714.0)   . CKD (chronic kidney disease), stage III   . Anemia   . Gout   . COPD (chronic obstructive pulmonary disease)   . DVT, lower extremity    Past Surgical History  Procedure Laterality Date  . Hernia repair  1982    right  . Cataract extraction    . Amputation  05/02/2011    Procedure: AMPUTATION DIGIT;  Surgeon: Kathryne Hitch;  Location: [facility] OR;  Service: Orthopedics;  Laterality: Left;  left great toe  . Colonoscopy w/ biopsies and polypectomy      Hx: of   . Amputation Left 07/03/2013    Procedure: LEFT 2ND TOE AMPUTATION ;  Surgeon: Kathryne Hitch, MD;  Location: [facility] OR;  Service: Orthopedics;  Laterality: Left;  . Esophageal dilation     Family History  Problem Relation Age of Onset  . Hypertension Mother   . Heart disease Father     CAD   History  Substance Use Topics  . Smoking status: Former Games developer  . Smokeless tobacco: Never Used  . Alcohol Use: No    Review of Systems  Constitutional: Negative for fever and chills.  HENT: Negative for congestion and facial swelling.   Eyes: Negative for discharge and visual disturbance.  Respiratory: Positive for chest tightness and shortness of breath.   Cardiovascular: Negative for chest pain and palpitations.  Gastrointestinal: Negative for vomiting, abdominal pain and diarrhea.  Musculoskeletal: Negative for arthralgias and myalgias.  Skin: Negative for color change and rash.  Neurological: Negative for tremors, syncope and headaches.  Psychiatric/Behavioral: Negative for confusion and dysphoric mood.      Allergies  Iohexol  Home Medications   Prior to Admission medications   Medication Sig Start Date End Date Taking? Authorizing Provider  Acai 25 MG CAPS Take 1 capsule by mouth daily.   Yes Historical Provider, MD  albuterol (PROVENTIL HFA;VENTOLIN HFA) 108 (90 BASE) MCG/ACT inhaler Inhale 2 puffs into the lungs every 6 (six) hours as needed for wheezing or shortness  of breath.   Yes Historical Provider, MD  calcium-vitamin D (OSCAL WITH D) 500-200 MG-UNIT per tablet Take 1 tablet by mouth daily.    Yes Historical Provider, MD  furosemide (LASIX) 40 MG tablet Take 1 tablet (40 mg total) by mouth daily. 01/02/14  Yes Lyn Records III, MD  HYDROcodone-acetaminophen (NORCO/VICODIN) 5-325 MG per tablet Take 1 tablet by mouth every 6 (six) hours as needed for moderate pain.   Yes Historical Provider, MD  menthol-thymol (ABSORBINE JR) LIQD Apply 1 application topically as  needed (for hip pain).   Yes Historical Provider, MD  methocarbamol (ROBAXIN) 500 MG tablet Take 500 mg by mouth 3 (three) times daily as needed for muscle spasms.   Yes Historical Provider, MD  methotrexate (RHEUMATREX) 2.5 MG tablet Take 7.5 mg by mouth every Monday. Caution:Chemotherapy. Protect from light.   Yes Historical Provider, MD  Multiple Vitamin (MULTIVITAMIN) tablet Take 1 tablet by mouth daily.   Yes Historical Provider, MD  predniSONE (DELTASONE) 10 MG tablet Take 20 mg by mouth daily with breakfast.    Yes Historical Provider, MD  Probiotic Product (PROBIOTIC DAILY PO) Take 1 tablet by mouth daily.   Yes Historical Provider, MD  warfarin (COUMADIN) 5 MG tablet Take 2.5-5 mg by mouth every morning. Takes 5mg  on Mon and Wed Takes 2.5mg  all other days   Yes Historical Provider, MD   BP 120/82  Pulse 99  Temp(Src) 98.3 F (36.8 C) (Oral)  Resp 17  Ht 5\' 9"  (1.753 m)  Wt 201 lb 3 oz (91.258 kg)  BMI 29.70 kg/m2  SpO2 96% Physical Exam  Constitutional: He is oriented to person, place, and time. He appears well-developed and well-nourished.  HENT:  Head: Normocephalic and atraumatic.  Eyes: EOM are normal. Pupils are equal, round, and reactive to light.  Neck: Normal range of motion. Neck supple. No JVD present.  Cardiovascular: Normal rate and regular rhythm.  Exam reveals no gallop and no friction rub.   No murmur heard. Pulmonary/Chest: No respiratory distress. He has wheezes (diffuse inspiratory and expiratory, prolonged expiration).  Abdominal: He exhibits no distension. There is no rebound and no guarding.  Musculoskeletal: Normal range of motion.  Neurological: He is alert and oriented to person, place, and time.  Skin: No rash noted. No pallor.  Psychiatric: He has a normal mood and affect. His behavior is normal.    ED Course  Procedures (including critical care time) Labs Review Labs Reviewed  CBC - Abnormal; Notable for the following:    RBC 3.91 (*)     Hemoglobin 11.5 (*)    HCT 32.9 (*)    RDW 16.6 (*)    All other components within normal limits  BASIC METABOLIC PANEL - Abnormal; Notable for the following:    Sodium 136 (*)    Glucose, Bld 118 (*)    BUN 29 (*)    Creatinine, Ser 1.55 (*)    GFR calc non Af Amer 38 (*)    GFR calc Af Amer 44 (*)    Anion gap 16 (*)    All other components within normal limits  PRO B NATRIURETIC PEPTIDE - Abnormal; Notable for the following:    Pro B Natriuretic peptide (BNP) 2309.0 (*)    All other components within normal limits  PROTIME-INR  BASIC METABOLIC PANEL  TSH  TROPONIN I  TROPONIN I  TROPONIN I  CBC  CREATININE, SERUM  I-STAT TROPOININ, ED    Imaging Review Dg Chest 2  View  01/12/2014   CLINICAL DATA:  Difficulty breathing; COPD  EXAM: CHEST  2 VIEW  COMPARISON:  December 31, 2013 chest radiograph and January 02, 2014 chest CT  FINDINGS: There is underlying emphysematous change. There is no edema or consolidation. Heart is mildly enlarged with pulmonary vascularity within normal limits. No adenopathy. Aorta is prominent but stable. No bone lesions.  IMPRESSION: Underlying emphysematous change. Heart is mildly enlarged. No edema or consolidation.   Electronically Signed   By: Bretta Bang M.D.   On: 01/12/2014 16:13     EKG Interpretation   Date/Time:  Saturday January 12 2014 14:36:51 EDT Ventricular Rate:  102 PR Interval:  180 QRS Duration: 120 QT Interval:  374 QTC Calculation: 487 R Axis:   -32 Text Interpretation:  Sinus tachycardia Left axis deviation Septal infarct  , age undetermined Abnormal ECG Confirmed by Bebe Shaggy  MD, DONALD ([id number])  on 01/12/2014 3:08:11 PM      MDM   Final diagnoses:  Acute systolic CHF (congestive heart failure)    80 yo M with a chief complaint of shortness of breath. Patient with EF of 30 of 35% found to have elevated BNP here. Cardiology consult.  Cardiology will admit.     Melene Plan, MD 01/12/14 [phone]

## 2014-01-12 NOTE — ED Notes (Signed)
Resident Adela Lank at the bedside.

## 2014-01-13 DIAGNOSIS — N189 Chronic kidney disease, unspecified: Secondary | ICD-10-CM

## 2014-01-13 DIAGNOSIS — N183 Chronic kidney disease, stage 3 unspecified: Secondary | ICD-10-CM

## 2014-01-13 DIAGNOSIS — I509 Heart failure, unspecified: Secondary | ICD-10-CM

## 2014-01-13 DIAGNOSIS — I130 Hypertensive heart and chronic kidney disease with heart failure and stage 1 through stage 4 chronic kidney disease, or unspecified chronic kidney disease: Secondary | ICD-10-CM

## 2014-01-13 LAB — TROPONIN I

## 2014-01-13 LAB — PROTIME-INR
INR: 2.49 — ABNORMAL HIGH (ref 0.00–1.49)
INR: 2.36 — ABNORMAL HIGH (ref 0.00–1.49)
PROTHROMBIN TIME: 26.9 s — AB (ref 11.6–15.2)
Prothrombin Time: 25.8 seconds — ABNORMAL HIGH (ref 11.6–15.2)

## 2014-01-13 LAB — BASIC METABOLIC PANEL
Anion gap: 15 (ref 5–15)
BUN: 29 mg/dL — ABNORMAL HIGH (ref 6–23)
CO2: 28 meq/L (ref 19–32)
Calcium: 8.9 mg/dL (ref 8.4–10.5)
Chloride: 95 mEq/L — ABNORMAL LOW (ref 96–112)
Creatinine, Ser: 1.58 mg/dL — ABNORMAL HIGH (ref 0.50–1.35)
GFR calc Af Amer: 43 mL/min — ABNORMAL LOW (ref >90)
GFR calc non Af Amer: 37 mL/min — ABNORMAL LOW (ref >90)
Glucose, Bld: 103 mg/dL — ABNORMAL HIGH (ref 70–99)
POTASSIUM: 3.8 meq/L (ref 3.7–5.3)
Sodium: 138 mEq/L (ref 137–147)

## 2014-01-13 MED ORDER — HYDRALAZINE HCL 25 MG PO TABS
25.0000 mg | ORAL_TABLET | Freq: Three times a day (TID) | ORAL | Status: DC
  Administered 2014-01-13 – 2014-01-18 (×14): 25 mg via ORAL
  Filled 2014-01-13 (×18): qty 1

## 2014-01-13 MED ORDER — ISOSORBIDE MONONITRATE 15 MG HALF TABLET
15.0000 mg | ORAL_TABLET | Freq: Every day | ORAL | Status: DC
  Administered 2014-01-13 – 2014-01-14 (×2): 15 mg via ORAL
  Filled 2014-01-13 (×2): qty 1

## 2014-01-13 NOTE — Progress Notes (Signed)
Assisted patient back to bed. Patient is alert and oriented x 4. Denies c/o pain/discomfort at present time. Encouraged patient to call with any needs, to which he verbalized understanding. Will continue to monitor.  Sharlene Dory, RN

## 2014-01-13 NOTE — Progress Notes (Signed)
ANTICOAGULATION CONSULT NOTE - Initial Consult  Pharmacy Consult for Heparin (when INR <2) Indication: DVT and cardiomyopathy   Labs:  Recent Labs  01/12/14 1453 01/12/14 1919 01/12/14 1939 01/13/14 0133  HGB 11.5*  --  11.6*  --   HCT 32.9*  --  33.4*  --   PLT 170  --  147*  --   LABPROT 27.8*  --   --  26.9*  INR 2.59*  --   --  2.49*  CREATININE 1.55*  --  1.55* 1.58*  TROPONINI  --  <0.30  --  <0.30    Assessment: 80 y/o M on warfarin PTA for DVT, to start heparin when INR <2 in anticipation of cath for cardiomyopathy. INR is 2.49. Other labs as above.   Goal of Therapy:  Heparin level 0.3-0.7 units/ml Monitor platelets by anticoagulation protocol: Yes   Plan:  -Re-check INR at 1800 today -Start heparin if INR <2  Abran Duke 01/13/2014,3:23 AM

## 2014-01-13 NOTE — Progress Notes (Signed)
The patient arrived to 3E18 at 2200 from the ED.  He was oriented to the unit and placed on telemetry.  CCMD was notified.  He is A&Ox4 and his breathing is unlabored.  Fine crackles can be heard in the lung bases, but he is not complaining of SOB at this time.  The patient states that he was walking at home with a cane until recently.  He states that he just saw the doctor on Tuesday about his right hip weakness, but that he is "too old for a hip replacement".  He states that ambulating has become difficult.  The call bell was explained and was placed within reach.  A heart failure packet was given to the patient.

## 2014-01-13 NOTE — Progress Notes (Addendum)
SUBJECTIVE: The patient remains SOB but is diuresing.  At this time, he denies chest pain or any new concerns.  Marland Kitchen dextromethorphan-guaiFENesin  1 tablet Oral BID  . furosemide  40 mg Intravenous BID  . [START ON 01/14/2014] methotrexate  7.5 mg Oral Q Mon  . predniSONE  20 mg Oral Q breakfast  . sodium chloride  3 mL Intravenous Q12H      OBJECTIVE: Physical Exam: Filed Vitals:   01/12/14 1915 01/12/14 1930 01/12/14 2209 01/13/14 0427  BP: 125/79 119/79 122/82 115/85  Pulse: 97 96 102 85  Temp:   98 F (36.7 C) 97.3 F (36.3 C)  TempSrc:    Oral  Resp: 17 16 16 17   Height:   5\' 9"  (1.753 m) 5\' 9"  (1.753 m)  Weight:   191 lb 12.8 oz (87 kg) 191 lb 12.8 oz (87 kg)  SpO2: 96% 92% 93% 96%    Intake/Output Summary (Last 24 hours) at 01/13/14 1041 Last data filed at 01/13/14 0951  Gross per 24 hour  Intake    480 ml  Output   1825 ml  Net  -1345 ml    Telemetry reveals sinus rhythm  GEN- The patient is elderly appearing, alert and oriented x 3 today.   Head- normocephalic, atraumatic Eyes-  Sclera clear, conjunctiva pink Ears- hearing intact Oropharynx- clear Neck- supple, elevated JVP Lungs- bibasilar rales, normal work of breathing Heart- Regular rate and rhythm, 2/6 SEM at the apex GI- soft, NT, ND, + BS Extremities- no clubbing, cyanosis, + dependant edema Skin- no rash or lesion Psych- euthymic mood, full affect Neuro- strength and sensation are intact  LABS: Basic Metabolic Panel:  Recent Labs  1453 01/12/14 1939 01/13/14 0133  NA 136*  --  138  K 4.9  --  3.8  CL 96  --  95*  CO2 24  --  28  GLUCOSE 118*  --  103*  BUN 29*  --  29*  CREATININE 1.55* 1.55* 1.58*  CALCIUM 9.0  --  8.9   Liver Function Tests: No results found for this basename: AST, ALT, ALKPHOS, BILITOT, PROT, ALBUMIN,  in the last 72 hours No results found for this basename: LIPASE, AMYLASE,  in the last 72 hours CBC:  Recent Labs  01/12/14 1453 01/12/14 1939    WBC 5.4 4.7  HGB 11.5* 11.6*  HCT 32.9* 33.4*  MCV 84.1 84.3  PLT 170 147*   Cardiac Enzymes:  Recent Labs  01/12/14 1919 01/13/14 0133 01/13/14 0643  TROPONINI <0.30 <0.30 <0.30   Thyroid Function Tests:  Recent Labs  01/12/14 1919  TSH 1.560   Anemia Panel: No results found for this basename: VITAMINB12, FOLATE, FERRITIN, TIBC, IRON, RETICCTPCT,  in the last 72 hours   ASSESSMENT AND PLAN:  Active Problems:   Acute systolic CHF (congestive heart failure)  1. Acute on chronic systolic dysfunction Per Dr 01/15/14 clinic notes, it appears that the patient has chronic CHF symptoms (previously more right heart than left heart failure).  Echo 2012 (under media in epic) revealed preserved EF then with mild to moderate MR. His MR is worse and systolic dysfunction is new.  Dr 01/14/14 has advised RHC/LHC.  Given the patients advanced age, a more conservative effort would also be reasonable as he would not be a surgical candidate regardless of findings on cath.  Medical therapy could therefore be optimized and cath reserved for refractory symptoms.  I had a long discussion with the patient  today.  He would prefer to discuss his options at length with Dr Katrinka Blazing (who is in the cath lab tomorrow).  I think that for continuity of care that this is the best approach. I will Dr Katrinka Blazing to see the patient in the am to discuss goals of care and management options. I will start imdur/ hydralazine.  Continue diuresis.  Consider addition of ace/ beta blocker as he becomes more stable clinically. I will not place cath orders as INR remains elevated and cath tomorrow would be very unlikely  2. Stage III CRI Follow closely with diuresis Holding on ace inhibitor for now.  This could be started electively depending on decision regarding cath.  3. Hypertensive cardiovascular disease with CHF Medical management as above  4. Chronic anticoagulation/ prior DVT Coumadin is on hold per Dr  Antoine Poche Heparin drip when INR < 2  Hillis Range, MD 01/13/2014 10:41 AM

## 2014-01-13 NOTE — Progress Notes (Signed)
Utilization Review Completed.   Isobella Ascher, RN, BSN Nurse Case Manager  

## 2014-01-14 ENCOUNTER — Encounter (HOSPITAL_COMMUNITY): Payer: Self-pay | Admitting: Physician Assistant

## 2014-01-14 ENCOUNTER — Inpatient Hospital Stay (HOSPITAL_COMMUNITY): Payer: Medicare HMO

## 2014-01-14 DIAGNOSIS — Z7901 Long term (current) use of anticoagulants: Secondary | ICD-10-CM

## 2014-01-14 LAB — BASIC METABOLIC PANEL
Anion gap: 12 (ref 5–15)
BUN: 36 mg/dL — ABNORMAL HIGH (ref 6–23)
CALCIUM: 8.3 mg/dL — AB (ref 8.4–10.5)
CO2: 30 meq/L (ref 19–32)
Chloride: 95 mEq/L — ABNORMAL LOW (ref 96–112)
Creatinine, Ser: 1.83 mg/dL — ABNORMAL HIGH (ref 0.50–1.35)
GFR calc Af Amer: 36 mL/min — ABNORMAL LOW (ref >90)
GFR calc non Af Amer: 31 mL/min — ABNORMAL LOW (ref >90)
GLUCOSE: 95 mg/dL (ref 70–99)
Potassium: 3.8 mEq/L (ref 3.7–5.3)
Sodium: 137 mEq/L (ref 137–147)

## 2014-01-14 LAB — PROTIME-INR
INR: 2.75 — AB (ref 0.00–1.49)
Prothrombin Time: 29.1 seconds — ABNORMAL HIGH (ref 11.6–15.2)

## 2014-01-14 MED ORDER — WARFARIN - PHARMACIST DOSING INPATIENT
Freq: Every day | Status: DC
  Administered 2014-01-15 – 2014-01-17 (×3)

## 2014-01-14 MED ORDER — WARFARIN SODIUM 2.5 MG PO TABS
2.5000 mg | ORAL_TABLET | Freq: Once | ORAL | Status: AC
  Administered 2014-01-14: 2.5 mg via ORAL
  Filled 2014-01-14: qty 1

## 2014-01-14 MED ORDER — ISOSORBIDE MONONITRATE ER 60 MG PO TB24
60.0000 mg | ORAL_TABLET | Freq: Every day | ORAL | Status: DC
  Administered 2014-01-15 – 2014-01-18 (×4): 60 mg via ORAL
  Filled 2014-01-14 (×4): qty 1

## 2014-01-14 MED ORDER — BACITRACIN-NEOMYCIN-POLYMYXIN OINTMENT TUBE
TOPICAL_OINTMENT | Freq: Every day | CUTANEOUS | Status: DC
  Administered 2014-01-14 – 2014-01-18 (×3): via TOPICAL
  Filled 2014-01-14: qty 15

## 2014-01-14 NOTE — Progress Notes (Addendum)
ANTICOAGULATION CONSULT NOTE - Follow Up Consult  Pharmacy Consult for heparin (when INR <2)  Indication: hx DVT  Allergies  Allergen Reactions  . Iohexol Itching and Rash         Patient Measurements: Height: 5\' 9"  (175.3 cm) Weight: 187 lb 9.8 oz (85.1 kg) IBW/kg (Calculated) : 70.7  Vital Signs: Temp: 98.4 F (36.9 C) (08/24 0625) Temp src: Oral (08/24 0625) BP: 115/70 mmHg (08/24 0625) Pulse Rate: 97 (08/24 0625)  Labs:  Recent Labs  01/12/14 1453 01/12/14 1919 01/12/14 1939 01/13/14 0133 01/13/14 0643 01/13/14 1923 01/14/14 0436  HGB 11.5*  --  11.6*  --   --   --   --   HCT 32.9*  --  33.4*  --   --   --   --   PLT 170  --  147*  --   --   --   --   LABPROT 27.8*  --   --  26.9*  --  25.8* 29.1*  INR 2.59*  --   --  2.49*  --  2.36* 2.75*  CREATININE 1.55*  --  1.55* 1.58*  --   --  1.83*  TROPONINI  --  <0.30  --  <0.30 <0.30  --   --     Estimated Creatinine Clearance: 29 ml/min (by C-G formula based on Cr of 1.83).   Assessment: 80 y/o male on warfarin PTA for hx DVT. Warfarin is being held for possible cath. Pharmacy consulted to begin IV heparin when INR <2.  INR is 2.75 and increased despite holding last night's dose therefore heparin is not indicated at this time. Home regimen is 2.5 mg daily except 5 mg on Mon, Wed - last dose 8/22.  Goal of Therapy:  INR 2-3 Monitor platelets by anticoagulation protocol: Yes   Plan:  - Warfarin on hold - Heparin when INR <2 - Daily INR  [facility], [facility].D., BCPS Clinical Pharmacist Pager: [phone] 01/14/2014 10:05 AM   Addendum: Conservative management of CAD recommended. Pharmacy consulted to resume warfarin and heparin consult discontinued.  INR therapeutic at 2.75 and increased despite holding dose last night. No bleeding noted.  Warfarin 2.5 mg PO tonight INR daily  [facility], [facility].D., BCPS Clinical Pharmacist Pager: [phone] 01/14/2014 1:02 PM

## 2014-01-14 NOTE — Progress Notes (Addendum)
Dr. Johney Frame contacted me about the patient's admission and wanted my input.  I reviewed the patient's clinical data including that initiated as an outpatient by me and that which is been obtained since admission to the hospital.  It seems as though coronary artery disease is the likely cause for the patient's deterioration in LV function. He has inferior regional wall motion abnormality within a more global pattern, severe mitral regurgitation, and severe pulmonary hypertension. He also has chronic kidney disease with worsening in function to stage III-IV with diuresis.  Given the patient's age, I have recommended conservative medical therapy. Continue diuresis. Increase LA nitrate. With severe MR, BP may improve with medication optimization.   I have also asked that he and his family decide about CODE STATUS. I have not recommended invasive evaluation and did not feel he would be a good surgical candidate. I have expressed my opinion to both the patient and his daughter. He does understand that he has the option of choosing a more aggressive management strategy if he so desires.  Total time spent including conversation with daughter by phone was > 40 minutes.

## 2014-01-14 NOTE — Consult Note (Addendum)
WOC wound consult note; pt states he is followed as an outpatient by the [facility] wound care center and has a history of previous other toe amputation and PVD. Reason for Consult: left toe wound Wound type: of unknown etiology Measurement:1cmx1cmx.1cm on left foot second metatarsal Wound bed:95%pink, 5%yellow Drainage (amount, consistency, odor) scant drainage no order Periwound:skin intact Dressing procedure/placement/frequency: Foam dressing applied. Pt states he uses Epsom salt baths, dries and applies Neosporin when at home. Will continue present plan of care with Neosporin and [facility]. While in hospital will use foam dressing for protection.  Charlesetta Garibaldi, RN, BSN, MSN-Student Please re-consult if further assistance is needed.  Thank-you,  Cammie Mcgee MSN, RN, CWOCN, [place], CNS [phone]

## 2014-01-14 NOTE — Progress Notes (Signed)
Patient ID: [name] [name], male   DOB: 1921-05-09, 80 y.o.   MRN: [id number] Mr. [name] [name] is well-known to me and I have seen him for several years now.  He does report right-sided "hip" pain, but points to his low back and sciatic area.  He denies any groin pain and was told it was bad hip arthritis.  However, his plain films do not show severe arthritis and even on exam, he shows no clinical signs/symtpoms of right hip arthritis.  He does, however have low back pain and some sciatica.  He does not need an injection while he is in the hospital, but he could benefit from a 6 day steroid taper and a very mild muscle relaxer if ok from a Cardiology perspective.  I have also followed his left foot for some time now.  He needs it to be debrided back minimally and closed, which, given his severe neuropathy, I can do in my office or at the bedside if he is here for a while.

## 2014-01-14 NOTE — Progress Notes (Signed)
Patient Name: [name] [name] Date of Encounter: 01/14/2014  Principal Problem:   Acute on chronic combined systolic and diastolic CHF (congestive heart failure) Active Problems:   PAD (peripheral artery disease)   HTN (hypertension)   CKD (chronic kidney disease), stage III   Chronic anticoagulation    Patient Profile: 80 yo male w/ hx pulm HTN had echo 08/11 for SOB showing EF 30-35%, + WMA, severe MR, severe pulm HTN. Admitted 08/22 w/ worsening SOB, weight either 201 or 197 lbs.  SUBJECTIVE: Still SOB unless on O2, uses cane at home, ambulation very limited due to right hip pain, pt says primary MD told him med rx only option. Pt requests second opinion.   OBJECTIVE Filed Vitals:   01/13/14 1420 01/13/14 2220 01/14/14 0237 01/14/14 0625  BP: 95/80 101/82 101/72 115/70  Pulse: 92 90 91 97  Temp: 97.7 F (36.5 C) 97.8 F (36.6 C) 97.3 F (36.3 C) 98.4 F (36.9 C)  TempSrc: Oral Oral Oral Oral  Resp: 17 18 18    Height:      Weight:    187 lb 9.8 oz (85.1 kg)  SpO2: 100% 100% 100%     Intake/Output Summary (Last 24 hours) at 01/14/14 1117 Last data filed at 01/14/14 1031  Gross per 24 hour  Intake    444 ml  Output   2125 ml  Net  -1681 ml   Filed Weights   01/12/14 2209 01/13/14 0427 01/14/14 0625  Weight: 191 lb 12.8 oz (87 kg) 191 lb 12.8 oz (87 kg) 187 lb 9.8 oz (85.1 kg)    PHYSICAL EXAM General: Well developed, well nourished, male in no acute distress. Head: Normocephalic, atraumatic.  Neck: Supple without bruits, JVD at 10 cm. Lungs:  Resp regular and unlabored, diffuse mild rales w/ decreased BS bases. Heart: Irregular, S1, S2, no S3, S4, or murmur; no rub. Abdomen: Soft, non-tender, non-distended, BS + x 4.  Extremities: No clubbing, cyanosis, no LE edema. Has  Neuro: Alert and oriented X 3. Moves all extremities spontaneously. Psych: Normal affect.  LABS: CBC:  Recent Labs  01/12/14 1453 01/12/14 1939  WBC 5.4 4.7  HGB 11.5* 11.6*    HCT 32.9* 33.4*  MCV 84.1 84.3  PLT 170 147*   INR:  Recent Labs  01/14/14 0436  INR 2.75*   Basic Metabolic Panel:  Recent Labs  01/16/14 0133 01/14/14 0436  NA 138 137  K 3.8 3.8  CL 95* 95*  CO2 28 30  GLUCOSE 103* 95  BUN 29* 36*  CREATININE 1.58* 1.83*  CALCIUM 8.9 8.3*   Cardiac Enzymes:  Recent Labs  01/12/14 1919 01/13/14 0133 01/13/14 0643  TROPONINI <0.30 <0.30 <0.30    Recent Labs  01/12/14 1508  TROPIPOC 0.02   BNP: Pro B Natriuretic peptide (BNP)  Date/Time Value Ref Range Status  01/12/2014  2:53 PM 2309.0* 0 - 450 pg/mL Final  12/31/2013  2:52 PM 664.0* 0.0 - 100.0 pg/mL Final   Thyroid Function Tests:  Recent Labs  01/12/14 1919  TSH 1.560   Lab Results  Component Value Date   INR 2.75* 01/14/2014   INR 2.36* 01/13/2014   INR 2.49* 01/13/2014    TELE:  SR, frequent multifocal PVCs      Radiology/Studies: Dg Chest 2 View 01/12/2014   CLINICAL DATA:  Difficulty breathing; COPD  EXAM: CHEST  2 VIEW  COMPARISON:  December 31, 2013 chest radiograph and January 02, 2014 chest CT  FINDINGS:  There is underlying emphysematous change. There is no edema or consolidation. Heart is mildly enlarged with pulmonary vascularity within normal limits. No adenopathy. Aorta is prominent but stable. No bone lesions.  IMPRESSION: Underlying emphysematous change. Heart is mildly enlarged. No edema or consolidation.   Electronically Signed   By: Bretta Bang M.D.   On: 01/12/2014 16:13    Current Medications:  . dextromethorphan-guaiFENesin  1 tablet Oral BID  . furosemide  40 mg Intravenous BID  . hydrALAZINE  25 mg Oral 3 times per day  . isosorbide mononitrate  15 mg Oral Daily  . methotrexate  7.5 mg Oral Q Mon  . predniSONE  20 mg Oral Q breakfast  . sodium chloride  3 mL Intravenous Q12H      ASSESSMENT AND PLAN 1. Acute on chronic combined systolic and diastolic dysfunction  Per Dr Lonn [place] clinic notes, it appears that the patient has  chronic CHF symptoms (previously more right heart than left heart failure). Echo 2012 (under media in epic) revealed preserved EF then with mild to moderate MR, grade 1 diast dysf and PAS 55.    Per 08/23 note, Dr Johney Frame:    His MR is worse and systolic dysfunction is new. Dr Antoine Poche has advised RHC/LHC. Given the patients advanced age, a more conservative effort would also be reasonable as he would not be a surgical candidate regardless of findings on cath. Medical therapy could therefore be optimized and cath reserved for refractory symptoms. I had a long discussion with the patient today. He would prefer to discuss his options at length with Dr Katrinka Blazing (who is in the cath lab tomorrow). I think that for continuity of care that this is the best approach.  I will ask Dr Katrinka Blazing to see the patient in the am to discuss goals of care and management options.  I will start imdur/ hydralazine. Continue diuresis. Consider addition of ace/ beta blocker as he becomes more stable clinically.  I will not place cath orders as INR remains elevated and cath tomorrow would be very unlikely   2. Stage III CRI  Follow closely with diuresis  No ACE/ARB inhibitor for now. Reconsider once Cr stabilized if no cath planned  3. Hypertensive cardiovascular disease with CHF  Medical management as above. BP too low to add BB, initially not started due to slight wheezing.   4. Chronic anticoagulation/ prior DVT Coumadin is on hold per Dr Antoine Poche  Heparin drip when INR < 2 Coumadin still trending up, last dose 08/21  5. PAD - Has wound left 2nd toe and s/p amputation left great toe. Some drainage from 2nd toe, will as wound care to see.   Melida Quitter , PA-C 11:17 AM 01/14/2014

## 2014-01-15 DIAGNOSIS — I5043 Acute on chronic combined systolic (congestive) and diastolic (congestive) heart failure: Principal | ICD-10-CM

## 2014-01-15 LAB — BASIC METABOLIC PANEL
Anion gap: 13 (ref 5–15)
BUN: 39 mg/dL — AB (ref 6–23)
CHLORIDE: 96 meq/L (ref 96–112)
CO2: 28 mEq/L (ref 19–32)
Calcium: 8 mg/dL — ABNORMAL LOW (ref 8.4–10.5)
Creatinine, Ser: 1.69 mg/dL — ABNORMAL HIGH (ref 0.50–1.35)
GFR calc non Af Amer: 34 mL/min — ABNORMAL LOW (ref >90)
GFR, EST AFRICAN AMERICAN: 39 mL/min — AB (ref >90)
Glucose, Bld: 87 mg/dL (ref 70–99)
POTASSIUM: 3.2 meq/L — AB (ref 3.7–5.3)
Sodium: 137 mEq/L (ref 137–147)

## 2014-01-15 LAB — PROTIME-INR
INR: 2.81 — ABNORMAL HIGH (ref 0.00–1.49)
Prothrombin Time: 29.6 seconds — ABNORMAL HIGH (ref 11.6–15.2)

## 2014-01-15 LAB — MAGNESIUM: Magnesium: 2 mg/dL (ref 1.5–2.5)

## 2014-01-15 MED ORDER — WARFARIN SODIUM 2.5 MG PO TABS
2.5000 mg | ORAL_TABLET | Freq: Once | ORAL | Status: AC
  Administered 2014-01-15: 2.5 mg via ORAL
  Filled 2014-01-15: qty 1

## 2014-01-15 MED ORDER — POTASSIUM CHLORIDE CRYS ER 20 MEQ PO TBCR
40.0000 meq | EXTENDED_RELEASE_TABLET | Freq: Once | ORAL | Status: AC
  Administered 2014-01-15: 40 meq via ORAL
  Filled 2014-01-15: qty 2

## 2014-01-15 NOTE — Progress Notes (Signed)
Patient Profile: 80 yo male w/ hx pulm HTN had echo 08/11 for SOB showing EF 30-35%, + WMA, severe MR, severe pulm HTN. Admitted 08/22 w/ worsening SOB. ProBNP on admit was elevated at 2309. Also with h/o DVT. On warfarin therapy.   Subjective: Still dyspneic with exertion. He is comfortable at rest on 2L [place].   Objective: Vital signs in last 24 hours: Temp:  [97.7 F (36.5 C)-98.8 F (37.1 C)] 97.7 F (36.5 C) (08/25 0510) Pulse Rate:  [59-92] 91 (08/25 0915) Resp:  [17-18] 18 (08/25 0915) BP: (109-127)/(69-81) 110/74 mmHg (08/25 0915) SpO2:  [100 %] 100 % (08/25 0915) Weight:  [187 lb 2.7 oz (84.9 kg)] 187 lb 2.7 oz (84.9 kg) (08/25 0510) Last BM Date: 01/15/14  Intake/Output from previous day: 08/24 0701 - 08/25 0700 In: 440 [P.O.:440] Out: 1425 [Urine:1425] Intake/Output this shift: Total I/O In: 250 [P.O.:240; I.V.:10] Out: 200 [Urine:200]  Medications Current Facility-Administered Medications  Medication Dose Route Frequency Provider Last Rate Last Dose  . 0.9 %  sodium chloride infusion  250 mL Intravenous PRN Rollene Rotunda, MD      . acetaminophen (TYLENOL) tablet 650 mg  650 mg Oral Q4H PRN Rollene Rotunda, MD      . albuterol (PROVENTIL) (2.5 MG/3ML) 0.083% nebulizer solution 3 mL  3 mL Inhalation Q6H PRN Rollene Rotunda, MD      . dextromethorphan-guaiFENesin [facility] DM) 30-600 MG per 12 hr tablet 1 tablet  1 tablet Oral BID Rollene Rotunda, MD   1 tablet at 01/15/14 1129  . furosemide (LASIX) injection 40 mg  40 mg Intravenous BID Rollene Rotunda, MD   40 mg at 01/15/14 1122  . hydrALAZINE (APRESOLINE) tablet 25 mg  25 mg Oral 3 times per day Hillis Range, MD   25 mg at 01/15/14 4854  . HYDROcodone-acetaminophen (NORCO/VICODIN) 5-325 MG per tablet 1 tablet  1 tablet Oral Q6H PRN Rollene Rotunda, MD      . isosorbide mononitrate (IMDUR) 24 hr tablet 60 mg  60 mg Oral Daily Lyn Records III, MD   60 mg at 01/15/14 1129  . methocarbamol (ROBAXIN) tablet 500 mg  500 mg  Oral TID PRN Rollene Rotunda, MD      . methotrexate (RHEUMATREX) tablet 7.5 mg  7.5 mg Oral Q Mon Rollene Rotunda, MD   7.5 mg at 01/14/14 1027  . neomycin-bacitracin-polymyxin (NEOSPORIN) ointment   Topical Daily Rollene Rotunda, MD      . ondansetron [facility]) injection 4 mg  4 mg Intravenous Q6H PRN Rollene Rotunda, MD      . predniSONE (DELTASONE) tablet 20 mg  20 mg Oral Q breakfast Rollene Rotunda, MD   20 mg at 01/15/14 [phone]  . sodium chloride 0.9 % injection 3 mL  3 mL Intravenous Q12H Rollene Rotunda, MD   3 mL at 01/15/14 1122  . sodium chloride 0.9 % injection 3 mL  3 mL Intravenous PRN Rollene Rotunda, MD      . warfarin (COUMADIN) tablet 2.5 mg  2.5 mg Oral ONCE-1800 [facility] [place], [place]      . Warfarin - Pharmacist Dosing Inpatient   Does not apply [address] [facility] [place], [facility]        PE: General appearance: alert, cooperative and no distress Neck: no JVD Lungs: decreased BS at the bases with bilaterl wheezing. No rales Heart: regular rate and rhythm Extremities: no LEE Pulses: 2+ and symmetric Skin: warm and dry Neurologic: Grossly normal  Lab Results:   Recent  Labs  01/12/14 1453 01/12/14 1939  WBC 5.4 4.7  HGB 11.5* 11.6*  HCT 32.9* 33.4*  PLT 170 147*   BMET  Recent Labs  01/13/14 0133 01/14/14 0436 01/15/14 0435  NA 138 137 137  K 3.8 3.8 3.2*  CL 95* 95* 96  CO2 28 30 28   GLUCOSE 103* 95 87  BUN 29* 36* 39*  CREATININE 1.58* 1.83* 1.69*  CALCIUM 8.9 8.3* 8.0*   PT/INR  Recent Labs  01/13/14 1923 01/14/14 0436 01/15/14 0435  LABPROT 25.8* 29.1* 29.6*  INR 2.36* 2.75* 2.81*   Cardiac Panel (last 3 results)  Recent Labs  01/12/14 1919 01/13/14 0133 01/13/14 0643  TROPONINI <0.30 <0.30 <0.30   Filed Weights   01/13/14 0427 01/14/14 0625 01/15/14 0510  Weight: 191 lb 12.8 oz (87 kg) 187 lb 9.8 oz (85.1 kg) 187 lb 2.7 oz (84.9 kg)   Assessment/Plan  Principal Problem:   Acute on chronic combined systolic and diastolic CHF  (congestive heart failure) Active Problems:   PAD (peripheral artery disease)   HTN (hypertension)   CKD (chronic kidney disease), stage III   Chronic anticoagulation  1. Cardiomyopathy: EF 30-35%. It seems as though coronary artery disease is the likely cause for the patient's deterioration in LV function. He has inferior regional wall motion abnormality within a more global pattern, severe mitral regurgitation, and severe pulmonary hypertension. He also has chronic kidney disease with worsening in function to stage III-IV with diuresis. Given the patient's age, his primary cardiologist, Dr. 01/17/14, has recommended conservative medical therapy. He is not a candidate for ACE-I/ARB therapy due to renal dysfunction. He is on hydralazine + nitrate as an alternative for afterload reduction. He is not placed on a BB on admit due to wheezing. He has had PVCs on telemetry and resting HR is in the 90s. ? Adding a cardioselective BB.   2. A/C Systolic CHF: net negative 3.5 L since admit. He appears close to euvolemic state. Will transition to PO Lasix today.   3. HTN: BP is controlled. Continue hydralazine,  Imdur and Lasix.    4. Hypokalemia: K is 3.2. Likely secondary to diuresis. Will replet with supplemental KCl.   5. CKD: Scr improved since yesterday at 1.69 (down from 1.83). Continue to monitor closely in the setting of diuretic therapy.   6. DVT: on chronic warfarin therapy. INR is therapeutic at 2.81. Continue dosing per pharmacy.  Plan for possible discharge in the next 24 hrs.      LOS: 3 days    Brittainy M. M 01/15/2014 12:15 PM  Patient seen, examined. Available data reviewed. Agree with findings, assessment, and plan as outlined by 01/17/2014, PA-C. The patient was independently interviewed and examined. He remains dyspneic with minimal activity. He is about 3.5 liters negative since admission. Would continue combination of IV diuresis, imdur, and hydralazine.  Wheezing minimal on my exam. With marginal BP, will hold on beta-blocker for now.  Robbie Lis, M.D. 01/15/2014 2:00 PM

## 2014-01-15 NOTE — Progress Notes (Signed)
Called and notified the on call MD for [facility]. Spoke with [place], [place] and told her that patient had a 8 beat run of v-tach. Pt.is asymptomatic and BP/HR was taken and documented. Was told to continue to monitor. Will notify night RN.

## 2014-01-15 NOTE — Care Management Note (Addendum)
    Page 1 of 2   01/18/2014     4:36:20 PM CARE MANAGEMENT NOTE 01/18/2014  Patient:  [name] [name], [name] [name]   Account Number:  [id number]  Date Initiated:  01/15/2014  Documentation initiated by:  Donato Schultz  Subjective/Objective Assessment:   dyspnea     Action/Plan:   CM to follow for disposition needs   Anticipated DC Date:  01/18/2014   Anticipated DC Plan:  HOME W HOME HEALTH SERVICES      DC Planning Services  CM consult      [facility] Choice  HOME HEALTH   Choice offered to / List presented to:  C-1 Patient        HH arranged  HH-1 RN  HH-10 DISEASE MANAGEMENT  HH-2 PT      HH agency  [facility]   Status of service:  Completed, signed off Medicare Important Message given?  YES (If response is "NO", the following Medicare IM given date fields will be blank) Date Medicare IM given:  01/16/2014 Medicare IM given by:  [facility] Date Additional Medicare IM given:  01/18/2014 Additional Medicare IM given by:  Sani Madariaga  Discharge Disposition:  HOME W HOME HEALTH SERVICES  Per UR Regulation:  Reviewed for med. necessity/level of care/duration of stay  If discussed at Long Length of Stay Meetings, dates discussed:   01/17/2014    Comments:  01/17/15 1030 Oletta Cohn, RN, BSN, [employer] [phone] Spoke with pt at bedside regarding discharge planning for [facility]. Offered pt list of home health agencies to choose from.  Pt chose [facility] to render services. Tamala Bari of [facility] notified.  No DME needs identified at this time  Donato Schultz RN, BSN, MSHL, CCM  Nurse - Case Manager,  (Unit [phone]  01/15/2014 Social:  From home with decreased mobility associated to SOB. Hx/o past HHS with [facility] PT RECS:  Pending Disposition Plan Pending

## 2014-01-15 NOTE — Evaluation (Signed)
Physical Therapy Evaluation Patient Details Name: [name] [name] MRN: [id number] DOB: 1921/01/22 Today's Date: 01/15/2014   History of Present Illness  Pt is a 80 y/o male presenting to the ED for SOB. He reports that his breathing has been short for over a month. He says however that he has had PND a couple of night this week. He has had increased SOB walking through his house. He thought today that his breathing was going to "cut off" and had his son bring him to the ED. CXR does not suggest edema although he does have emphysema on a recent CT and chest Xray.  Clinical Impression  Pt admitted with the above. Pt currently with functional limitations due to the deficits listed below (see PT Problem List). At the time of PT eval pt was able to perform ambulation with RW for energy conservation and balance support better vs. With SPC. Pt encouraged to use RW but is resistant stating he would rather use 2 canes instead. After ambulation O2 sats at 90% on RA. Pt will benefit from skilled PT to increase their independence and safety with mobility to allow discharge to the venue listed below.      Follow Up Recommendations Home health PT;Supervision - Intermittent    Equipment Recommendations  None recommended by PT    Recommendations for Other Services       Precautions / Restrictions Precautions Precautions: Fall Restrictions Weight Bearing Restrictions: No      Mobility  Bed Mobility Overal bed mobility: Needs Assistance Bed Mobility: Supine to Sit     Supine to sit: Min assist     General bed mobility comments: Assist to elevate trunk to full sitting position.   Transfers Overall transfer level: Needs assistance Equipment used: Straight cane Transfers: Sit to/from Stand Sit to Stand: Min guard         General transfer comment: Pt asking to do it himself, and although moving very slowly, did not require physical assist.   Ambulation/Gait Ambulation/Gait assistance: Min  guard Ambulation Distance (Feet): 300 Feet Assistive device: Straight cane;Rolling walker (2 wheeled) Gait Pattern/deviations: Step-through pattern;Decreased stride length;Narrow base of support;Trunk flexed Gait velocity: Decreased Gait velocity interpretation: Below normal speed for age/gender General Gait Details: Pt with DOE and unsteadiness with use of SPC. PT encouraging pt to use RW for energy conservation and support. Pt did well with RW use and asking to continue ambulating because he felt so good. No SOB noted.   Stairs            Wheelchair Mobility    Modified Rankin (Stroke Patients Only)       Balance Overall balance assessment: Needs assistance Sitting-balance support: Feet supported;No upper extremity supported Sitting balance-Leahy Scale: Fair     Standing balance support: Single extremity supported Standing balance-Leahy Scale: Fair Standing balance comment: Pt does better with RW vs. SPC,                             Pertinent Vitals/Pain Pain Assessment: No/denies pain (At rest)    Home Living Family/patient expects to be discharged to:: Private residence Living Arrangements: Spouse/significant other;Children Available Help at Discharge: Family;Available PRN/intermittently Type of Home: House Home Access: Stairs to enter Entrance Stairs-Rails: None Entrance Stairs-Number of Steps: 3 Home Layout: One level Home Equipment: Walker - 2 wheels;Cane - single point;Shower seat Additional Comments: Pt has daughter and son that lives with him and wife. They both work  and are available intermitterntly. Pt is primary caregiver for wife who has advanced Alzheimer's.     Prior Function Level of Independence: Independent with assistive device(s)         Comments: Uses cane all the time. Still driving, daughter does the grocery shopping and cooking, some housekeeping.      Hand Dominance   Dominant Hand: Right    Extremity/Trunk Assessment    Upper Extremity Assessment: Defer to OT evaluation           Lower Extremity Assessment: Generalized weakness      Cervical / Trunk Assessment: Kyphotic  Communication   Communication: No difficulties  Cognition Arousal/Alertness: Awake/alert Behavior During Therapy: WFL for tasks assessed/performed Overall Cognitive Status: Within Functional Limits for tasks assessed                      General Comments      Exercises        Assessment/Plan    PT Assessment Patient needs continued PT services  PT Diagnosis Difficulty walking;Generalized weakness   PT Problem List Decreased strength;Decreased range of motion;Decreased activity tolerance;Decreased balance;Decreased mobility;Decreased knowledge of use of DME;Decreased safety awareness;Decreased knowledge of precautions;Cardiopulmonary status limiting activity  PT Treatment Interventions DME instruction;Gait training;Stair training;Functional mobility training;Therapeutic activities;Therapeutic exercise;Neuromuscular re-education;Patient/family education   PT Goals (Current goals can be found in the Care Plan section) Acute Rehab PT Goals Patient Stated Goal: To return home to his wife. PT Goal Formulation: With patient Time For Goal Achievement: 01/22/14 Potential to Achieve Goals: Good    Frequency Min 3X/week   Barriers to discharge   Pt is caregiver for wife    Co-evaluation               End of Session Equipment Utilized During Treatment: Gait belt;Oxygen Activity Tolerance: Patient tolerated treatment well Patient left: in chair;with call bell/phone within reach;with chair alarm set Nurse Communication: Mobility status         Time: 7544-9201 PT Time Calculation (min): 40 min   Charges:   PT Evaluation $Initial PT Evaluation Tier I: 1 Procedure PT Treatments $Gait Training: 8-22 mins $Therapeutic Activity: 8-22 mins   PT G Codes:          Ruthann Cancer 01/15/2014, 12:53  PM  Ruthann Cancer, PT, DPT Acute Rehabilitation Services Pager: [phone]

## 2014-01-15 NOTE — Progress Notes (Signed)
Patient ID: [name] [name], male   DOB: Aug 05, 1920, 80 y.o.   MRN: [id number] I came by the bedside this afternoon and debrided his left foot 2nd toe back to a stable margin with good bleeding tissue at the bedside.  I was able to close the skin with nylon suture and placed new dressing.  This dressing should stay in place for the next 2-3 days.  I'll either change it myself and/or leave instructions for him when he is at home.  I'll see him as an outpatient in the next 2 weeks.

## 2014-01-15 NOTE — Discharge Instructions (Signed)
You can get your left foot wound wet daily in the shower. Place a small amount of triple antibiotic ointment daily on your left foot wound followed by a large bandaid.

## 2014-01-15 NOTE — Progress Notes (Signed)
ANTICOAGULATION CONSULT NOTE - Follow Up Consult  Pharmacy Consult for warfarin  Indication: hx DVT  Allergies  Allergen Reactions  . Iohexol Itching and Rash         Patient Measurements: Height: 5\' 9"  (175.3 cm) Weight: 187 lb 2.7 oz (84.9 kg) IBW/kg (Calculated) : 70.7  Vital Signs: Temp: 97.7 F (36.5 C) (08/25 0510) Temp src: Oral (08/25 0510) BP: 110/74 mmHg (08/25 0915) Pulse Rate: 91 (08/25 0915)  Labs:  Recent Labs  01/12/14 1453 01/12/14 1919 01/12/14 1939 01/13/14 0133 01/13/14 0643 01/13/14 1923 01/14/14 0436 01/15/14 0435  HGB 11.5*  --  11.6*  --   --   --   --   --   HCT 32.9*  --  33.4*  --   --   --   --   --   PLT 170  --  147*  --   --   --   --   --   LABPROT 27.8*  --   --  26.9*  --  25.8* 29.1* 29.6*  INR 2.59*  --   --  2.49*  --  2.36* 2.75* 2.81*  CREATININE 1.55*  --  1.55* 1.58*  --   --  1.83* 1.69*  TROPONINI  --  <0.30  --  <0.30 <0.30  --   --   --     Estimated Creatinine Clearance: 31.4 ml/min (by C-G formula based on Cr of 1.69).   Assessment: 80 y/o male on warfarin PTA for hx DVT. Conservative management of CAD recommended. Pharmacy consulted to manage warfarin.  INR is therapeutic at 2.81. No bleeding noted. Eating well per chart documentation. Home regimen is 2.5 mg daily except 5 mg on Mon, Wed - last dose 8/22.  Goal of Therapy:  INR 2-3 Monitor platelets by anticoagulation protocol: Yes   Plan:  - Warfarin 2.5 mg PO tonight - Daily INR  [facility], [facility].D., BCPS Clinical Pharmacist Pager: [phone] 01/15/2014 10:43 AM

## 2014-01-15 NOTE — Progress Notes (Signed)
Pt.is A/Ox4 and is ambulatory. Pt. K+ this morning was low. Paged Bjorn Loser, [place] to notify. New orders written for potassium po. Pt. BP was 94/63 this afternoon . Was told to hold 2pm dose of hydralazine and also notified her about pt.having a 5 beat run of v-tach and that pt. was asymptomatic. Was told to continue to monitor.

## 2014-01-16 ENCOUNTER — Inpatient Hospital Stay (HOSPITAL_COMMUNITY): Payer: Medicare HMO

## 2014-01-16 DIAGNOSIS — I5043 Acute on chronic combined systolic (congestive) and diastolic (congestive) heart failure: Secondary | ICD-10-CM

## 2014-01-16 DIAGNOSIS — I509 Heart failure, unspecified: Secondary | ICD-10-CM

## 2014-01-16 DIAGNOSIS — I5021 Acute systolic (congestive) heart failure: Secondary | ICD-10-CM

## 2014-01-16 DIAGNOSIS — Z515 Encounter for palliative care: Secondary | ICD-10-CM

## 2014-01-16 LAB — BASIC METABOLIC PANEL
ANION GAP: 16 — AB (ref 5–15)
BUN: 42 mg/dL — ABNORMAL HIGH (ref 6–23)
CALCIUM: 7.9 mg/dL — AB (ref 8.4–10.5)
CO2: 26 mEq/L (ref 19–32)
Chloride: 95 mEq/L — ABNORMAL LOW (ref 96–112)
Creatinine, Ser: 1.62 mg/dL — ABNORMAL HIGH (ref 0.50–1.35)
GFR calc Af Amer: 41 mL/min — ABNORMAL LOW (ref >90)
GFR calc non Af Amer: 36 mL/min — ABNORMAL LOW (ref >90)
Glucose, Bld: 129 mg/dL — ABNORMAL HIGH (ref 70–99)
Potassium: 3.6 mEq/L — ABNORMAL LOW (ref 3.7–5.3)
SODIUM: 137 meq/L (ref 137–147)

## 2014-01-16 LAB — PROTIME-INR
INR: 2.81 — AB (ref 0.00–1.49)
Prothrombin Time: 29.6 seconds — ABNORMAL HIGH (ref 11.6–15.2)

## 2014-01-16 LAB — MAGNESIUM: MAGNESIUM: 2 mg/dL (ref 1.5–2.5)

## 2014-01-16 MED ORDER — GUAIFENESIN ER 600 MG PO TB12
600.0000 mg | ORAL_TABLET | Freq: Two times a day (BID) | ORAL | Status: DC
  Administered 2014-01-16 – 2014-01-18 (×4): 600 mg via ORAL
  Filled 2014-01-16 (×5): qty 1

## 2014-01-16 MED ORDER — WARFARIN SODIUM 2.5 MG PO TABS
2.5000 mg | ORAL_TABLET | Freq: Once | ORAL | Status: AC
  Administered 2014-01-16: 2.5 mg via ORAL
  Filled 2014-01-16: qty 1

## 2014-01-16 MED ORDER — POTASSIUM CHLORIDE CRYS ER 20 MEQ PO TBCR
40.0000 meq | EXTENDED_RELEASE_TABLET | Freq: Once | ORAL | Status: AC
  Administered 2014-01-16: 40 meq via ORAL
  Filled 2014-01-16: qty 2

## 2014-01-16 NOTE — Progress Notes (Signed)
Patient ID: [name] [name], male   DOB: [birth year], 80 y.o.   MRN: [id number] Mr. [name] [name] has been up ambulating.  Still with right-sided low back pain.  His left 2nd toe suture-line looks great and is dry/clean following our bedside debridement.  Will order lumbar spine films while he is here.

## 2014-01-16 NOTE — Progress Notes (Signed)
Full note to follow:  I met with [name] [name] today who seems to have a good understanding of his diagnosis and prognosis. He tells me that he would not want CPR/shock/intubation if his heart/lungs fail - DNR ordered. He tells me that he has a Living Will. He is most concerned about his family and specifically about his wife. He has devoted the past few years to caring with his wife who has Alzheimer's dementia and he is concerned about not being able to care for her now due to his shortness of breath. He is also very concerned about his children and how they will respond to this news with his heart failure. He tells me that he is not worried about anything except his family. He says that he is a very rationale and logical person who is not very emotional. He enjoys reading and poetry (he is able to recite numerous poems applicable to conversation) - and appears to be very intelligent. He accepts that his life is limited and says that he has had a long and wonderful life.   I was unable to reach daughter - Vaughan Basta to set up meeting time. I will continue to attempt to reach family to speak with them all together.   Vinie Sill, NP Palliative Medicine Team Pager # [phone] (M-F 8a-5p) Team Phone # [phone] (Nights/Weekends)

## 2014-01-16 NOTE — ED Provider Notes (Signed)
I saw and evaluated the patient, reviewed the resident's note and I agree with the findings and plan.   EKG Interpretation   Date/Time:  Saturday January 12 2014 14:36:51 EDT Ventricular Rate:  102 PR Interval:  180 QRS Duration: 120 QT Interval:  374 QTC Calculation: 487 R Axis:   -32 Text Interpretation:  Sinus tachycardia Left axis deviation Septal infarct  , age undetermined Abnormal ECG Confirmed by Bebe Shaggy  MD, DONALD ([id number])  on 01/12/2014 3:08:11 PM      [name] [name] is a 80 y.o. male hx of CHF, CKD here with shortness of breath for 2 weeks. Associated with chest tightness. Lasix increased by cardiology with no improvement. On exam, appears to have crackles bilateral bases, 2+ leg edema. Abdomen soft. I am concerned for CHF exacerbation. Given lasix 40 mg in the ED and patient admitted to cardiology.    Richardean Canal, MD 01/16/14 [phone]

## 2014-01-16 NOTE — Consult Note (Signed)
Patient QA:STMH T Ehle      DOB: 23-Aug-1920      DQQ:[id number]     Consult Note from the Palliative Medicine Team at [name] Requested by: Rosaria Ferries, PA     PCP: Stephens Shire, MD Reason for Consultation: [place] and options     Phone [phone]  Assessment of patients Current state:  I met with [name] [name] today who seems to have a good understanding of his diagnosis and prognosis. He tells me that he would not want CPR/shock/intubation if his heart/lungs fail - DNR ordered. He tells me that he has a Living Will. He is most concerned about his family and specifically about his wife. He has devoted the past few years to caring with his wife who has Alzheimer's dementia and he is concerned about not being able to care for her now due to his shortness of breath. He is also very concerned about his children and how they will respond to this news with his heart failure and poor prognosis. He tells me that he is not worried about anything except his family. He says that he is a very rationale and logical person who is not very emotional. He enjoys reading and poetry (he is able to recite numerous poems applicable to conversation) - and appears to be very intelligent and understanding. He accepts that his life is limited and says that he has had a long and wonderful life.    Goals of Care: 1.  Code Status: DNR   2. Scope of Treatment: Continue medical interventions.    4. Disposition: He would like to return home - will discuss more with family palliative and hospice options for at home.    3. Symptom Management:   1. Pain: Acetaminophen prn mild pain. Vicodin 5-325 mg 1 tablet every 6 hours prn moderate pain.  2. Muscle spasms: Robaxin TID prn.  3. Nausea/Vomiting: Ondansetron prn.   4. Psychosocial: Emotional support provided to patient.   5. Spiritual: He says that his personal pastor has been visiting with him.    Brief HPI: 80 yo admitted with  worsening dyspnea related to acute on chronic systolic and diastolic heart failure (EF 30-35%). He continues to have shortness of breath with any activity but not at rest. PMH significant for hypertension, PAD, rheumatoid arthritis, CKD stage III, COPD, h/o DVT, gout.    ROS: Denies pain, nausea, anxiety. + shortness of breath with any activity    PMH:  Past Medical History  Diagnosis Date  . Hypertension   . Chronic kidney disease   . PAD (peripheral artery disease)   . Rheumatoid arthritis(714.0)   . CKD (chronic kidney disease), stage III   . Anemia   . Gout   . COPD (chronic obstructive pulmonary disease)   . DVT, lower extremity   . Chronic anticoagulation      PSH: Past Surgical History  Procedure Laterality Date  . Hernia repair  1982    right  . Cataract extraction    . Amputation  05/02/2011    Procedure: AMPUTATION DIGIT;  Surgeon: Mcarthur Rossetti;  Location: [facility];  Service: Orthopedics;  Laterality: Left;  left great toe  . Colonoscopy w/ biopsies and polypectomy      Hx: of  . Amputation Left 07/03/2013    Procedure: LEFT 2ND TOE AMPUTATION ;  Surgeon: Mcarthur Rossetti, MD;  Location: [facility];  Service: Orthopedics;  Laterality: Left;  . Esophageal dilation  I have reviewed the FH and SH and  If appropriate update it with new information. Allergies  Allergen Reactions  . Iohexol Itching and Rash        Scheduled Meds: . furosemide  40 mg Intravenous BID  . guaiFENesin  600 mg Oral BID  . hydrALAZINE  25 mg Oral 3 times per day  . isosorbide mononitrate  60 mg Oral Daily  . methotrexate  7.5 mg Oral Q Mon  . neomycin-bacitracin-polymyxin   Topical Daily  . predniSONE  20 mg Oral Q breakfast  . sodium chloride  3 mL Intravenous Q12H  . warfarin  2.5 mg Oral ONCE-1800  . Warfarin - Pharmacist Dosing Inpatient   Does not apply q1800   Continuous Infusions:  PRN Meds:.sodium chloride, acetaminophen, albuterol, HYDROcodone-acetaminophen,  methocarbamol, ondansetron (ZOFRAN) IV, sodium chloride    BP 108/55  Pulse 89  Temp(Src) 98.3 F (36.8 C) (Oral)  Resp 18  Ht 5\' 9"  (1.753 m)  Wt 84.6 kg (186 lb 8.2 oz)  BMI 27.53 kg/m2  SpO2 100%   PPS: 30%   Intake/Output Summary (Last 24 hours) at 01/16/14 1517 Last data filed at 01/16/14 1442  Gross per 24 hour  Intake    840 ml  Output   1890 ml  Net  -1050 ml   LBM: 01/15/14                      Physical Exam:  General:  NAD, well nourished, pleasant and jovial affect HEENT: [place]/AT, +JVD, moist mucous membranes Chest: Slight bibasilar crackles, no labored breathing, symmetric CVS: RRR, S1 S2 Abdomen: Soft, NT, ND, +BS Ext: MAE, trace BLE edema, warm to touch Neuro: Awake, alert, oriented x 3, follows commands  Labs: CBC    Component Value Date/Time   WBC 4.7 01/12/2014 1939   RBC 3.96* 01/12/2014 1939   HGB 11.6* 01/12/2014 1939   HCT 33.4* 01/12/2014 1939   PLT 147* 01/12/2014 1939   MCV 84.3 01/12/2014 1939   MCH 29.3 01/12/2014 1939   MCHC 34.7 01/12/2014 1939   RDW 16.5* 01/12/2014 1939   LYMPHSABS 0.4* 12/31/2013 1452   MONOABS 0.5 12/31/2013 1452   EOSABS 0.0 12/31/2013 1452   BASOSABS 0.0 12/31/2013 1452    BMET    Component Value Date/Time   NA 137 01/16/2014 1045   K 3.6* 01/16/2014 1045   CL 95* 01/16/2014 1045   CO2 26 01/16/2014 1045   GLUCOSE 129* 01/16/2014 1045   BUN 42* 01/16/2014 1045   CREATININE 1.62* 01/16/2014 1045   CALCIUM 7.9* 01/16/2014 1045   GFRNONAA 36* 01/16/2014 1045   GFRAA 41* 01/16/2014 1045    CMP     Component Value Date/Time   NA 137 01/16/2014 1045   K 3.6* 01/16/2014 1045   CL 95* 01/16/2014 1045   CO2 26 01/16/2014 1045   GLUCOSE 129* 01/16/2014 1045   BUN 42* 01/16/2014 1045   CREATININE 1.62* 01/16/2014 1045   CALCIUM 7.9* 01/16/2014 1045   PROT 6.2 05/01/2011 0635   ALBUMIN 2.3* 05/01/2011 0635   AST 23 05/01/2011 0635   ALT 16 05/01/2011 0635   ALKPHOS 51 05/01/2011 0635   BILITOT 0.5 05/01/2011 0635   GFRNONAA 36*  01/16/2014 1045   GFRAA 41* 01/16/2014 1045      Time In Time Out Total Time Spent with Patient Total Overall Time  1420 1520 01/18/2014    Greater than 50%  of this time was spent  counseling and coordinating care related to the above assessment and plan.  Vinie Sill, NP Palliative Medicine Team Pager # [phone] (M-F 8a-5p) Team Phone # [phone] (Nights/Weekends)

## 2014-01-16 NOTE — Care Management (Signed)
Spoke with pt at bedside regarding discharge planning for [facility]. Offered pt list of home health agencies to choose from.  Pt chose [facility] to render services. Tamala Bari of [facility] notified.  No DME needs identified at this time.

## 2014-01-16 NOTE — Progress Notes (Signed)
ANTICOAGULATION CONSULT NOTE - Follow Up Consult  Pharmacy Consult for warfarin  Indication: hx DVT  Allergies  Allergen Reactions  . Iohexol Itching and Rash         Patient Measurements: Height: 5\' 9"  (175.3 cm) Weight: 186 lb 8.2 oz (84.6 kg) IBW/kg (Calculated) : 70.7  Vital Signs: Temp: 97.6 F (36.4 C) (08/26 0706) Temp src: Oral (08/26 0706) BP: 109/76 mmHg (08/26 0706) Pulse Rate: 87 (08/26 0706)  Labs:  Recent Labs  01/14/14 0436 01/15/14 0435 01/16/14 0440  LABPROT 29.1* 29.6* 29.6*  INR 2.75* 2.81* 2.81*  CREATININE 1.83* 1.69*  --     Estimated Creatinine Clearance: 29.1 ml/min (by C-G formula based on Cr of 1.69).   Assessment: 80 y/o male on warfarin PTA for hx DVT. Conservative management of CAD recommended. Pharmacy consulted to manage warfarin.  INR is therapeutic at 2.81. No bleeding noted. Eating well per chart documentation. Home regimen is 2.5 mg daily except 5 mg on Mon, Wed - last dose 8/22.  Goal of Therapy:  INR 2-3 Monitor platelets by anticoagulation protocol: Yes   Plan:  - Warfarin 2.5 mg PO tonight - Daily INR  [facility], [facility].D., BCPS Clinical Pharmacist Pager: [phone] 01/16/2014 12:56 PM

## 2014-01-16 NOTE — Progress Notes (Signed)
Patient Name: [name] [name] Date of Encounter: 01/16/2014  Principal Problem:   Acute on chronic combined systolic and diastolic CHF (congestive heart failure) Active Problems:   PAD (peripheral artery disease)   HTN (hypertension)   CKD (chronic kidney disease), stage III   Chronic anticoagulation    Patient Profile: 80 yo male w/ hx pulm HTN had echo 08/11 for SOB showing EF 30-35%, + WMA, severe MR, severe pulm HTN. Admitted 08/22 w/ worsening SOB, weight either 201 or 197 lbs. Dr. Katrinka Blazing felt med rx for CAD best option.  SUBJECTIVE: No chest pain, still SOB w/ minimal exertion. Has not ambulated much. Concerned about going home to care for his wife.  OBJECTIVE Filed Vitals:   01/15/14 1400 01/15/14 1937 01/15/14 2220 01/16/14 0706  BP: 113/45 102/61 109/70 109/76  Pulse: 62 87 88 87  Temp:   98.1 F (36.7 C) 97.6 F (36.4 C)  TempSrc:   Oral Oral  Resp:   18 18  Height:      Weight:    186 lb 8.2 oz (84.6 kg)  SpO2:   100% 99%    Intake/Output Summary (Last 24 hours) at 01/16/14 0948 Last data filed at 01/16/14 8676  Gross per 24 hour  Intake   1430 ml  Output   1850 ml  Net   -420 ml   Filed Weights   01/14/14 0625 01/15/14 0510 01/16/14 0706  Weight: 187 lb 9.8 oz (85.1 kg) 187 lb 2.7 oz (84.9 kg) 186 lb 8.2 oz (84.6 kg)    PHYSICAL EXAM General: Well developed, well nourished, male in no acute distress. Head: Normocephalic, atraumatic.  Neck: Supple without bruits, JVD 10 cm. Lungs:  Resp regular and unlabored, rales bases. Heart: RRR, S1, S2, no S3, S4, or murmur; no rub. Abdomen: Soft, non-tender, non-distended, BS + x 4.  Extremities: No clubbing, cyanosis, no edema.  Neuro: Alert and oriented X 3. Moves all extremities spontaneously. Psych: Normal affect.  LABS: INR: Recent Labs  01/16/14 0440  INR 2.81*   Basic Metabolic Panel: Recent Labs  01/14/14 0436 01/15/14 0435  NA 137 137  K 3.8 3.2*  CL 95* 96  CO2 30 28  GLUCOSE 95 87   BUN 36* 39*  CREATININE 1.83* 1.69*  CALCIUM 8.3* 8.0*  MG  --  2.0   BNP: Pro B Natriuretic peptide (BNP)  Date/Time Value Ref Range Status  01/12/2014  2:53 PM 2309.0* 0 - 450 pg/mL Final  12/31/2013  2:52 PM 664.0* 0.0 - 100.0 pg/mL Final    TELE:  SR, PVCs, frequent at times, rare short runs NSVT, asymptomatic  Radiology/Studies: Dg Hip Complete Right 01/14/2014   CLINICAL DATA:  Right hip pain.  EXAM: RIGHT HIP - COMPLETE 2+ VIEW  COMPARISON:  None.  FINDINGS: There is no evidence of hip fracture or dislocation. Moderate narrowing of both hip joints is noted consistent with degenerative joint disease. Minimal osteophyte formation is seen involving the right hip.  IMPRESSION: Moderate degenerative joint disease of both hips is noted. No fracture or dislocation is noted.   Electronically Signed   By: Roque Lias M.D.   On: 01/14/2014 14:47    Current Medications:  . dextromethorphan-guaiFENesin  1 tablet Oral BID  . furosemide  40 mg Intravenous BID  . hydrALAZINE  25 mg Oral 3 times per day  . isosorbide mononitrate  60 mg Oral Daily  . methotrexate  7.5 mg Oral Q Mon  . neomycin-bacitracin-polymyxin  Topical Daily  . predniSONE  20 mg Oral Q breakfast  . sodium chloride  3 mL Intravenous Q12H  . Warfarin - Pharmacist Dosing Inpatient   Does not apply q1800   ASSESSMENT AND PLAN: Principal Problem:   Acute on chronic combined systolic and diastolic CHF (congestive heart failure) - I/O decreased 4 L since admit, weight down over 10 lbs. Continue IV Lasix for now, change to PO soon. Pt says phlegm gets caught in his throat, increase Mucinex   Active Problems:   PAD (peripheral artery disease) - Dr. Magnus Ivan has seen, toe is dressed and he is to f/u in office    HTN (hypertension) - BP running a little low at times, will decrease hydralazine to 12.5 mg TID    CKD (chronic kidney disease), stage III - follow BMET daily    Chronic anticoagulation - INR therapeutic     Hypokalemia - had 40 meq yesterday, recheck pending, ck Mg    NSVT - make sure electrolytes OK, follow. No sx.    Deconditioning - HHPT recommended, will order [facility] as well.     Social - pt very concerned about his wife, he will need close followup after d/c.  Plan - increase activity, may need home O2.  SignedTheodore Demark , PA-C 9:48 AM 01/16/2014  Patient seen, examined. Available data reviewed. Agree with findings, assessment, and plan as outlined by Theodore Demark, PA-C. The patient was independently interviewed and examined. He remains asymptomatic at rest, but is markedly limited with any activity because of shortness of breath. Exam reveals a pleasant elderly man in no distress, JVP is normal, lungs are coarse bilaterally with diminished breath sounds in the bases, heart is regular rate and rhythm with a soft systolic murmur at the left lower sternal border, abdomen is soft and nontender, extremities are without edema. Despite good diuresis, the patient is not improving clinically. He is noted to have severe segmental LV dysfunction and severe  secondary (ischemic) mitral regurgitation. He continues on Imdur, hydralazine, and IV Lasix. We'll continue his same medical therapy as he is having steady diuresis each day. I think it is overall outlook is quite poor. I have reviewed Dr. Michaelle Copas note. The patient is 80 years old and he cares for his wife who has advanced Alzheimer's dementia. His social situation is difficult now considering his marked limitation related to heart failure. I spoke with him at length this morning about consideration of palliative care/hospice. I am not sure that he and his family feel ready for this, but I don't think we have much to offer with medical or interventional therapies. He is agreeable to meeting with palliative medicine and he has good insight into his cardiac disease.  Tonny Bollman, M.D. 01/16/2014 10:50 AM

## 2014-01-17 DIAGNOSIS — Z515 Encounter for palliative care: Secondary | ICD-10-CM

## 2014-01-17 LAB — PROTIME-INR
INR: 2.79 — ABNORMAL HIGH (ref 0.00–1.49)
Prothrombin Time: 29.4 seconds — ABNORMAL HIGH (ref 11.6–15.2)

## 2014-01-17 LAB — BASIC METABOLIC PANEL
Anion gap: 15 (ref 5–15)
BUN: 42 mg/dL — AB (ref 6–23)
CALCIUM: 7.7 mg/dL — AB (ref 8.4–10.5)
CO2: 28 mEq/L (ref 19–32)
CREATININE: 1.67 mg/dL — AB (ref 0.50–1.35)
Chloride: 98 mEq/L (ref 96–112)
GFR calc Af Amer: 40 mL/min — ABNORMAL LOW (ref >90)
GFR calc non Af Amer: 34 mL/min — ABNORMAL LOW (ref >90)
GLUCOSE: 93 mg/dL (ref 70–99)
Potassium: 3.2 mEq/L — ABNORMAL LOW (ref 3.7–5.3)
Sodium: 141 mEq/L (ref 137–147)

## 2014-01-17 MED ORDER — FUROSEMIDE 40 MG PO TABS
40.0000 mg | ORAL_TABLET | Freq: Two times a day (BID) | ORAL | Status: DC
  Administered 2014-01-17 – 2014-01-18 (×2): 40 mg via ORAL
  Filled 2014-01-17 (×4): qty 1

## 2014-01-17 MED ORDER — MOMETASONE FUROATE 0.1 % EX CREA
TOPICAL_CREAM | Freq: Every day | CUTANEOUS | Status: DC
  Administered 2014-01-17 – 2014-01-18 (×2): via TOPICAL
  Filled 2014-01-17 (×2): qty 15

## 2014-01-17 MED ORDER — WARFARIN SODIUM 2.5 MG PO TABS
2.5000 mg | ORAL_TABLET | Freq: Once | ORAL | Status: AC
  Administered 2014-01-17: 2.5 mg via ORAL
  Filled 2014-01-17: qty 1

## 2014-01-17 NOTE — Progress Notes (Signed)
I was able to get in touch with Mr. Kosta's daughter Bonita Quin this morning (I was unable to reach her yesterday). She will follow up with me for a time we may meet with them as a family hopefully Friday. I will continue to follow up with her.  I also saw Mr. Marek today who had visitors today so I was brief and told him I had spoken with Bonita Quin. He has no questions or concerns.   Yong Channel, NP Palliative Medicine Team Pager # [phone] (M-F 8a-5p) Team Phone # [phone] (Nights/Weekends)

## 2014-01-17 NOTE — Progress Notes (Signed)
Patient Name: [name] [name] Date of Encounter: 01/17/2014  Principal Problem:   Acute on chronic combined systolic and diastolic CHF (congestive heart failure) Active Problems:   PAD (peripheral artery disease)   HTN (hypertension)   CKD (chronic kidney disease), stage III   Chronic anticoagulation   Palliative care encounter    Patient Profile: 80 yo male w/ hx pulm HTN had echo 08/11 for SOB showing EF newly low at 30-35%, + WMA, severe MR, severe pulm HTN. Admitted 08/22 w/ worsening SOB, weight 197 lbs. Dr. Katrinka Blazing rec med rx for CAD. Ortho consult for MS pain and s/p L 2nd toe surgery. Pt w/ recalcitrant CHF and new LVD. Overall prognosis is poor. Palliative care seeing.    SUBJECTIVE: Breathing better, has been off O2 since last pm. Has not ambulated much.  OBJECTIVE Filed Vitals:   01/16/14 1300 01/16/14 2150 01/17/14 0508 01/17/14 1032  BP: 108/55 98/68 97/70  115/86  Pulse: 89 98 98 94  Temp: 98.3 F (36.8 C) 98 F (36.7 C) 98.5 F (36.9 C)   TempSrc: Oral Oral Oral   Resp: 18 18 18 20   Height:      Weight:   185 lb (83.915 kg)   SpO2: 100% 97% 95% 98%    Intake/Output Summary (Last 24 hours) at 01/17/14 1138 Last data filed at 01/17/14 0901  Gross per 24 hour  Intake    600 ml  Output   1490 ml  Net   -890 ml   Filed Weights   01/15/14 0510 01/16/14 0706 01/17/14 0508  Weight: 187 lb 2.7 oz (84.9 kg) 186 lb 8.2 oz (84.6 kg) 185 lb (83.915 kg)    PHYSICAL EXAM General: Well developed, well nourished, male in no acute distress. Head: Normocephalic, atraumatic.  Neck: Supple without bruits, JVD 8 cm Lungs:  Resp regular and unlabored, rales bases. Heart: RRR, S1, S2, no S3, S4, or murmur; no rub. Abdomen: Soft, non-tender, non-distended, BS + x 4.  Extremities: No clubbing, cyanosis, no edema.  Neuro: Alert and oriented X 3. Moves all extremities spontaneously. Psych: Normal affect.  LABS: INR: Recent Labs  01/17/14 0645  INR 2.79*   Basic  Metabolic Panel: Recent Labs  01/15/14 0435 01/16/14 1045 01/17/14 0645  NA 137 137 141  K 3.2* 3.6* 3.2*  CL 96 95* 98  CO2 28 26 28   GLUCOSE 87 129* 93  BUN 39* 42* 42*  CREATININE 1.69* 1.62* 1.67*  CALCIUM 8.0* 7.9* 7.7*  MG 2.0 2.0  --    BNP: Pro B Natriuretic peptide (BNP)  Date/Time Value Ref Range Status  01/12/2014  2:53 PM 2309.0* 0 - 450 pg/mL Final  12/31/2013  2:52 PM 664.0* 0.0 - 100.0 pg/mL Final    TELE:   SR, PVCs and rare pairs, salvos   Radiology/Studies: Dg Lumbar Spine Complete  01/16/2014   CLINICAL DATA:  Low back pain for the past month.  No known injury.  EXAM: LUMBAR SPINE - COMPLETE 4+ VIEW  COMPARISON:  None.  FINDINGS: Five non-rib-bearing lumbar vertebrae. Multilevel degenerative changes with associated minimal anterolisthesis at the L4-5 level. Vacuum phenomenon at the L5-S1 level. No fractures or pars defects.  IMPRESSION: Degenerative changes.  No acute abnormality.   Electronically Signed   By: 03/02/2014 M.D.   On: 01/16/2014 23:22     Current Medications:  . furosemide  40 mg Intravenous BID  . guaiFENesin  600 mg Oral BID  . hydrALAZINE  25  mg Oral 3 times per day  . isosorbide mononitrate  60 mg Oral Daily  . methotrexate  7.5 mg Oral Q Mon  . mometasone   Topical Daily  . neomycin-bacitracin-polymyxin   Topical Daily  . predniSONE  20 mg Oral Q breakfast  . sodium chloride  3 mL Intravenous Q12H  . warfarin  2.5 mg Oral ONCE-1800  . Warfarin - Pharmacist Dosing Inpatient   Does not apply q1800      ASSESSMENT AND PLAN: Principal Problem: Acute on chronic combined systolic and diastolic CHF (congestive heart failure) - I/O decreased >5 L since admit, weight down > 10 lbs. Got  IV Lasix this am, may be able to change to PO. Pt says phlegm gets caught in his throat, Mucinex increased  Active Problems:  PAD (peripheral artery disease) and MS pain - Lumbar Xray OK, Dr. Magnus Ivan has seen, toe is dressed and he is to f/u in office    HTN (hypertension) - With SBP 90s at times, decrease hydralazine to 12.5 mg TID   CKD (chronic kidney disease), stage III - follow BMET daily   Chronic anticoagulation - INR therapeutic   Hypokalemia - had 40 meq yesterday, lower today, supp, Mg OK  NSVT - supp K +, follow. No sx.   Deconditioning - HHPT recommended, will order [facility] as well.   Social - pt very concerned about his wife, he will need close followup after d/c.     Palliative care encounter - he is now a DNR, they are trying to set up a family meeting  Plan - increase activity, may need home O2. Home soon.   SignedTheodore Demark , PA-C 11:38 AM 01/17/2014  Patient seen, examined. Available data reviewed. Agree with findings, assessment, and plan as outlined by Theodore Demark, PA-C. Exam reveals clear lung fields, heart RRR, no peripheral edema. Pt clinically improved. Appreciate Palliative Care consult. Transition to PO lasix today. Anticipate d/c home in next 24-48 hours. Will try to arrange [facility] for heart failure management. No ACE with CKD/tenuous renal function/age 53 at risk of AKI.  Tonny Bollman, M.D. 01/17/2014 12:58 PM

## 2014-01-17 NOTE — Progress Notes (Signed)
ANTICOAGULATION CONSULT NOTE - Follow Up Consult  Pharmacy Consult for warfarin  Indication: hx DVT  Allergies  Allergen Reactions  . Iohexol Itching and Rash         Patient Measurements: Height: 5\' 9"  (175.3 cm) Weight: 185 lb (83.915 kg) IBW/kg (Calculated) : 70.7  Vital Signs: Temp: 98.5 F (36.9 C) (08/27 0508) Temp src: Oral (08/27 0508) BP: 115/86 mmHg (08/27 1032) Pulse Rate: 94 (08/27 1032)  Labs:  Recent Labs  01/15/14 0435 01/16/14 0440 01/16/14 1045 01/17/14 0645  LABPROT 29.6* 29.6*  --  29.4*  INR 2.81* 2.81*  --  2.79*  CREATININE 1.69*  --  1.62* 1.67*    Estimated Creatinine Clearance: 29.4 ml/min (by C-G formula based on Cr of 1.67).   Assessment: 80 y/o male on warfarin PTA for hx DVT. Conservative management of CAD recommended. Pharmacy consulted to manage warfarin.  INR is therapeutic at 2.79. No bleeding noted. Eating well per chart documentation. Home regimen is 2.5 mg daily except 5 mg on Mon, Wed - last dose 8/22.  Goal of Therapy:  INR 2-3 Monitor platelets by anticoagulation protocol: Yes   Plan:  - Warfarin 2.5 mg PO tonight - Daily INR  9/22. Arlean Hopping, PharmD Clinical Pharmacist Pager [phone]  01/17/2014 12:52 PM

## 2014-01-17 NOTE — Progress Notes (Signed)
Utilization Review Completed Dawn Kiper J. Abeni Finchum, RN, BSN, NCM [phone]  

## 2014-01-17 NOTE — Progress Notes (Signed)
Physical Therapy Treatment Patient Details Name: [name] [name] MRN: [id number] DOB: 28-Dec-1920 Today's Date: 01/17/2014    History of Present Illness Pt is a 80 y/o male presenting to the ED for SOB. He reports that his breathing has been short for over a month. He says however that he has had PND a couple of night this week. He has had increased SOB walking through his house. He thought today that his breathing was going to "cut off" and had his son bring him to the ED. CXR does not suggest edema although he does have emphysema on a recent CT and chest Xray.    PT Comments    Pt progressing towards physical therapy goals. Pt was able to ambulate in hallway with RW, and decreased SOB compared to last session. Pt continues to want to use the [facility] or 2 SPC's for assistance, however pt educated on benefits of RW including energy conservation. Recommended at home for pt to use SPC in small spaces (bathroom, etc) however when ambulating outside or longer distances in the house to use the RW for energy conservation.   Follow Up Recommendations  Home health PT;Supervision - Intermittent     Equipment Recommendations  None recommended by PT    Recommendations for Other Services       Precautions / Restrictions Precautions Precautions: Fall Restrictions Weight Bearing Restrictions: No    Mobility  Bed Mobility               General bed mobility comments: Pt sitting in recliner upon PT arrival  Transfers Overall transfer level: Needs assistance Equipment used: Straight cane;Rolling walker (2 wheeled) Transfers: Sit to/from Stand Sit to Stand: Min guard         General transfer comment: VC's for hand placement on seated surface for safety.   Ambulation/Gait Ambulation/Gait assistance: Min guard;Supervision Ambulation Distance (Feet): 400 Feet Assistive device: Rolling walker (2 wheeled) Gait Pattern/deviations: Step-through pattern;Decreased stride length;Trunk  flexed Gait velocity: Decreased Gait velocity interpretation: Below normal speed for age/gender General Gait Details: RW for most of gait training. Pt was cued for improved posture as well as for pursed-lip breathing. Overall pt states his SOB is better today.    Stairs            Wheelchair Mobility    Modified Rankin (Stroke Patients Only)       Balance Overall balance assessment: Needs assistance Sitting-balance support: Feet supported;No upper extremity supported Sitting balance-Leahy Scale: Good     Standing balance support: Single extremity supported Standing balance-Leahy Scale: Fair                      Cognition Arousal/Alertness: Awake/alert Behavior During Therapy: WFL for tasks assessed/performed Overall Cognitive Status: Within Functional Limits for tasks assessed                      Exercises General Exercises - Lower Extremity Long Arc Quad: 15 reps Hip ABduction/ADduction: 20 reps    General Comments        Pertinent Vitals/Pain Pain Assessment: No/denies pain    Home Living                      Prior Function            PT Goals (current goals can now be found in the care plan section) Acute Rehab PT Goals Patient Stated Goal: To return home to his wife. PT Goal  Formulation: With patient Time For Goal Achievement: 01/22/14 Potential to Achieve Goals: Good Progress towards PT goals: Progressing toward goals    Frequency  Min 3X/week    PT Plan Current plan remains appropriate    Co-evaluation             End of Session Equipment Utilized During Treatment: Gait belt Activity Tolerance: Patient tolerated treatment well Patient left: in chair;with call bell/phone within reach;with chair alarm set     Time: 0254-2706 PT Time Calculation (min): 32 min  Charges:  $Gait Training: 8-22 mins $Therapeutic Activity: 8-22 mins                    G Codes:      Ruthann Cancer 23-Jan-2014, 5:09  PM  Ruthann Cancer, PT, DPT Acute Rehabilitation Services Pager: [phone]

## 2014-01-17 NOTE — Progress Notes (Signed)
Patient ID: [name] [name], male   DOB: 1920/08/14, 80 y.o.   MRN: [id number] I did review x-rays of his lumbar spine and found no acute changes.  There are mild degenerative changes.  He reports that he has been up with therapy and his back pain seems to be easing off.  I have already made an outpatient follow-up with him in my office.

## 2014-01-17 NOTE — Progress Notes (Signed)
Pharmacist Heart Failure Core Measure Documentation  Assessment: [name] [name] has an EF documented as 30-35% on 01/01/14 by ECHO.  Rationale: Heart failure patients with left ventricular systolic dysfunction (LVSD) and an EF < 40% should be prescribed an angiotensin converting enzyme inhibitor (ACEI) or angiotensin receptor blocker (ARB) at discharge unless a contraindication is documented in the medical record.  This patient is not currently on an ACEI or ARB for HF.  This note is being placed in the record in order to provide documentation that a contraindication to the use of these agents is present for this encounter.  ACE Inhibitor or Angiotensin Receptor Blocker is contraindicated (specify all that apply)  []   ACEI allergy AND ARB allergy []   Angioedema []   Moderate or severe aortic stenosis []   Hyperkalemia [x]   Hypotension []   Renal artery stenosis []   Worsening renal function, preexisting renal disease or dysfunction  . , PharmD Clinical Pharmacist Pager [phone]  01/17/2014 12:58 PM

## 2014-01-18 LAB — PROTIME-INR
INR: 2.74 — ABNORMAL HIGH (ref 0.00–1.49)
Prothrombin Time: 29 seconds — ABNORMAL HIGH (ref 11.6–15.2)

## 2014-01-18 LAB — BASIC METABOLIC PANEL
Anion gap: 16 — ABNORMAL HIGH (ref 5–15)
BUN: 39 mg/dL — AB (ref 6–23)
CHLORIDE: 96 meq/L (ref 96–112)
CO2: 27 mEq/L (ref 19–32)
CREATININE: 1.61 mg/dL — AB (ref 0.50–1.35)
Calcium: 7.7 mg/dL — ABNORMAL LOW (ref 8.4–10.5)
GFR calc Af Amer: 42 mL/min — ABNORMAL LOW (ref >90)
GFR calc non Af Amer: 36 mL/min — ABNORMAL LOW (ref >90)
GLUCOSE: 88 mg/dL (ref 70–99)
Potassium: 3.2 mEq/L — ABNORMAL LOW (ref 3.7–5.3)
Sodium: 139 mEq/L (ref 137–147)

## 2014-01-18 MED ORDER — POTASSIUM CHLORIDE ER 20 MEQ PO TBCR: 40.0000 meq | EXTENDED_RELEASE_TABLET | Freq: Every day | ORAL | Status: DC

## 2014-01-18 MED ORDER — FUROSEMIDE 40 MG PO TABS: 40.0000 mg | ORAL_TABLET | Freq: Two times a day (BID) | ORAL | Status: DC

## 2014-01-18 MED ORDER — HYDRALAZINE HCL 25 MG PO TABS: 25.0000 mg | ORAL_TABLET | Freq: Three times a day (TID) | ORAL | Status: DC

## 2014-01-18 MED ORDER — ISOSORBIDE MONONITRATE ER 60 MG PO TB24: 60.0000 mg | ORAL_TABLET | Freq: Every day | ORAL | Status: DC

## 2014-01-18 MED ORDER — POTASSIUM CHLORIDE CRYS ER 20 MEQ PO TBCR
40.0000 meq | EXTENDED_RELEASE_TABLET | Freq: Once | ORAL | Status: AC
  Administered 2014-01-18: 40 meq via ORAL
  Filled 2014-01-18: qty 2

## 2014-01-18 NOTE — Discharge Summary (Signed)
Physician Discharge Summary  Patient ID: [name] [name] MRN: [id number] DOB/AGE: [id number] 78 y.o.  Admit date: 01/12/2014 Discharge date: 01/18/2014  Primary Cardiologist: Dr. Katrinka Blazing   Admission Diagnoses: A/C combined systolic + diastolic CHF  Discharge Diagnoses:  Principal Problem:   Acute on chronic combined systolic and diastolic CHF (congestive heart failure) Active Problems:   PAD (peripheral artery disease)   HTN (hypertension)   CKD (chronic kidney disease), stage III   Chronic anticoagulation   Palliative care encounter   Discharged Condition: stable  Hospital Course: The patient is a 80 y/o male, followed by Dr. Katrinka Blazing with a h/o pulmonary HTN, COPD, CKD, PVD, HTN as well as combined systolic + diastolic CHF. Last 2D echo was 01/01/14 demonstrating an EF of 30-35% with diffuse hypokinesis and akinesis of the inferolateral and inferior myocardium. Grade I diastolic dysfunction was noted as well as restricted posterior MV leaflet with severe MR. There was mild AI and severely elevated pulmonary pressures. He also has h/o DVT, on chronic anticoagulation with coumadin.   He presented to [facility] on 01/12/14 with several weeks worth of progressive worsening DOE, orthopnea and PND.  BNP was elevated at 2309. CXR did not suggest edema, however slight wheezing was noted on physical exam. Scr on admit was 1.55. His EKG demonstrated sinus tachycardia with rate of 102, possible anteroseptal infarct old, PACs, no acute ST T wave changes. He was admitted for IV diuretic therapy. He was not placed on ACE/ARB therapy given his baseline renal function. Hydralazine + nitrate therapy was initiated as an alternative for afterload reduction. He was not placed on  BB therapy due to COPD with physical exam findings of wheezing. A R/LHC was initially considered, however given the patients advanced age, it was felt that  a more conservative effort would also be reasonable as he would not be a surgical  candidate regardless of findings on cath.  Optimized medical therapy was recommended. He diuresed well and wheezing improved, thus initiation of a BB was considered, however his BP was too low for therapy to be started. Despite good diuresis, he showed very little clinical improvement and it was felt that his overall prognosis was poor. Palliative care was consulted. CODE status was discussed. He initially changed to DNR but after further discussion with his family, he verbalized that she wanted to remain FULL CODE. 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 This was arranged prior to discharge.   By hospital day 6, he was feeling better. Still with some dyspnea with activity, but comfortable at rest. He was last seen and examined by Dr. Excell Seltzer who determined he was stable for discharge home. It was recommended that he be discharged home on 40 mg of PO Lasix BID with a f/u BMP in 1-2 weeks post discharge. He was also continued on Imdur and Hydralazine. He is scheduled for post-hospital f/u with Dr. Katrinka Blazing on 01/24/14.     Consults: Palliative Care  Significant Diagnostic Studies:   Treatments: See Hospital Course  Discharge Exam: Blood pressure 105/91, pulse 81, temperature 98.1 F (36.7 C), temperature source Oral, resp. rate 18, height 5\' 9"  (1.753 m), weight 184 lb 3.2 oz (83.553 kg), SpO2 99.00%.   Disposition: 01-Home or Self Care      Discharge Instructions   Face-to-face encounter (required for  Medicare/Medicaid patients)    Complete by:  As directed   I Chaunice Obie certify that this patient is under my care and that I, or a nurse practitioner or physician's assistant working with me, had a face-to-face encounter that meets the physician face-to-face encounter requirements with this patient on  01/18/2014. The encounter with the patient was in whole, or in part for the following medical condition(s) which is the primary reason for home health care (List medical condition): advanced heart failure  The encounter with the patient was in whole, or in part, for the following medical condition, which is the primary reason for home health care:  heart failure  I certify that, based on my findings, the following services are medically necessary home health services:  Nursing  My clinical findings support the need for the above services:  Unable to leave home safely without assistance and/or assistive device  Further, I certify that my clinical findings support that this patient is homebound due to:  Unable to leave home safely without assistance  Reason for Medically Necessary Home Health Services:  Skilled Nursing- Change/Decline in Patient Status     Home Health    Complete by:  As directed   To provide the following care/treatments:   RN PT              Medication List         Acai 25 MG Caps  Take 1 capsule by mouth daily.     albuterol 108 (90 BASE) MCG/ACT inhaler  Commonly known as:  PROVENTIL HFA;VENTOLIN HFA  Inhale 2 puffs into the lungs every 6 (six) hours as needed for wheezing or shortness of breath.     calcium-vitamin D 500-200 MG-UNIT per tablet  Commonly known as:  OSCAL WITH D  Take 1 tablet by mouth daily.     furosemide 40 MG tablet  Commonly known as:  LASIX  Take 1 tablet (40 mg total) by mouth 2 (two) times daily.     hydrALAZINE 25 MG tablet  Commonly known as:  APRESOLINE  Take 1 tablet (25 mg total) by mouth every 8 (eight) hours.     HYDROcodone-acetaminophen 5-325 MG per tablet  Commonly known as:  NORCO/VICODIN  Take 1 tablet by mouth every 6 (six) hours as needed for moderate pain.     isosorbide mononitrate 60 MG 24 hr tablet  Commonly known as:  IMDUR  Take 1 tablet (60 mg total) by mouth daily.     menthol-thymol Liqd  Apply 1  application topically as needed (for hip pain).     methocarbamol 500 MG tablet  Commonly known as:  ROBAXIN  Take 500 mg by mouth 3 (three) times daily as needed for muscle spasms.     methotrexate 2.5 MG tablet  Commonly known as:  RHEUMATREX  Take 7.5 mg by mouth every Monday. Caution:Chemotherapy. Protect from light.     multivitamin tablet  Take 1 tablet by mouth daily.     Potassium Chloride ER 20 MEQ Tbcr  Take 40 mEq by mouth daily.     predniSONE 10 MG tablet  Commonly known as:  DELTASONE  Take 20 mg by mouth daily with breakfast.     PROBIOTIC DAILY PO  Take 1 tablet by mouth daily.     warfarin 5 MG tablet  Commonly known as:  COUMADIN  - Take 2.5-5 mg by mouth every morning. Takes 5mg  on Mon and Wed  - Takes 2.5mg  all other days  Follow-up Information   Follow up with Kathryne Hitch, MD. Schedule an appointment as soon as possible for a visit on 01/29/2014. (@10 :15 am spoke with [facility])    Specialty:  Orthopedic Surgery   Contact information:   [address] [place] [phone]       Follow up with [facility]. (RN/PT)    Specialty:  Home Health Services   Contact information:   [address] [place] [phone]       Follow up with [phone], MD On 01/24/2014. (3:00 PM )    Specialty:  Cardiology   Contact information:   [address] [place] [phone]      TIME SPENT ON DISCHARGE, INCLUDING PHYSICIAN TIME: >30 MINUTES  Signed: [phone] 01/18/2014, 2:07 PM

## 2014-01-18 NOTE — Progress Notes (Signed)
Patient is being discharged home. He has been provided with discharge instructions. RN went over instructions with the patient and answered all questions the patient had. He is being transported home by his family.

## 2014-01-18 NOTE — Progress Notes (Signed)
    Subjective:  Feeling better. Still with some dyspnea with activity, but comfortable at rest.  Objective:  Vital Signs in the last 24 hours: Temp:  [98 F (36.7 C)-98.1 F (36.7 C)] 98.1 F (36.7 C) (08/28 0440) Pulse Rate:  [81-96] 81 (08/28 0440) Resp:  [18-20] 18 (08/28 0440) BP: (105-107)/(81-91) 105/91 mmHg (08/28 0440) SpO2:  [97 %-99 %] 99 % (08/28 0440) Weight:  [184 lb 3.2 oz (83.553 kg)] 184 lb 3.2 oz (83.553 kg) (08/28 0440)  Intake/Output from previous day: 08/27 0701 - 08/28 0700 In: 360 [P.O.:360] Out: [place] [Urine:1275]  Physical Exam: Pt is alert and oriented, pleasant elderly male in NAD HEENT: normal Neck: JVP - normal Lungs: CTA bilaterally CV: RRR without murmur or gallop Abd: soft, NT, Positive BS, no hepatomegaly Ext: no C/C/E, distal pulses intact and equal Skin: warm/dry no rash   Lab Results: No results found for this basename: WBC, HGB, PLT,  in the last 72 hours  Recent Labs  01/17/14 0645 01/18/14 0600  NA 141 139  K 3.2* 3.2*  CL 98 96  CO2 28 27  GLUCOSE 93 88  BUN 42* 39*  CREATININE 1.67* 1.61*   No results found for this basename: TROPONINI, CK, MB,  in the last 72 hours  Tele: Personally reviewed, sinus rhythm  Assessment/Plan:  Acute on chronic combined systolic and diastolic CHF (congestive heart failure) - the patient has showed slow improvement. He is now comfortable at rest off of oxygen. He remains symptomatic with activity, but I think it is going to take him a fair amount of time to recover. I would recommend continuing hydralazine and Imdur. Will discharge him on furosemide 40 mg twice daily. I think he is stable for discharge home today. It will be important for him to have a transition of care visit with a metabolic panel checked in the next one to 2 weeks. I also think he would benefit from home health with both physical therapy and a home health RN to help with management of his congestive heart failure. Lengthy  discussion over the course of his hospitalization about goals of care. He was initially a DO NOT RESUSCITATE. Palliative medicine has been involved in his care. However, after some discussion with his family, he has changed his mind and is now back to a full code. I asked him to further discuss ongoing goals of care after discharge when he sees Dr. Tamala Julian back in followup. For now I think he is doing fairly well and hopefully will remain stable for some time. He understands that his long-term prognosis in the setting of severe cardiomyopathy and severe mitral regurgitation is not good.  Active Problems:  PAD (peripheral artery disease) and MS pain - Lumbar Xray OK, Dr. Ninfa Linden has seen, toe is dressed and he is to f/u in office   HTN (hypertension) - well controlled. No room with his blood pressure did escalate heart failure therapy any more aggressively.  CKD (chronic kidney disease), stage III - labs have been stable. He will need to be met arranged as an outpatient in one to 2 weeks.  Chronic anticoagulation - INR therapeutic   Deconditioning - HHPT recommended, will order [facility] as well.  Disposition: Home today. Close outpatient followup will be arranged. Home health as above.  Sherren Mocha, M.D. 01/18/2014, 10:52 AM

## 2014-01-18 NOTE — Progress Notes (Signed)
Progress Note from the Palliative Medicine Team at [facility]  Subjective: I spoke with Mr. Dezarn's daughter, Bonita Quin, over the phone again who tells me she has further spoken with her father who does not wish to meet now. She also tells me that they have discussed code status and that they do not want to "give up." I further explained what DNR means and does not mean giving up - but they may continue to discuss this and I would further discuss with her father to make sure we are following his wishes - she agrees. I also spoke with her about outpatient palliative care consult and that I will provide her father information if he desires to discuss in the future and she agrees to accept this information. She was very pleasant over the phone.  Mr. Evilsizer further tells me today that they have decided "to let things ride" and not worry about them right now - which is quite a change from the other day when he clearly expressed interest in discussing with his family. I did tell him that I respect whatever decisions he does make. I asked him about code status again and told him what Bonita Quin had expressed to me over the phone and he tells me "Bonita Quin is in charge - whatever she says." I explained full code and he agrees to this change. I encouraged him to continue discussions with his family.     Objective: Allergies  Allergen Reactions  . Iohexol Itching and Rash        Scheduled Meds: . furosemide  40 mg Oral BID  . guaiFENesin  600 mg Oral BID  . hydrALAZINE  25 mg Oral 3 times per day  . isosorbide mononitrate  60 mg Oral Daily  . methotrexate  7.5 mg Oral Q Mon  . mometasone   Topical Daily  . neomycin-bacitracin-polymyxin   Topical Daily  . predniSONE  20 mg Oral Q breakfast  . sodium chloride  3 mL Intravenous Q12H  . Warfarin - Pharmacist Dosing Inpatient   Does not apply q1800   Continuous Infusions:  PRN Meds:.sodium chloride, acetaminophen, albuterol, HYDROcodone-acetaminophen,  methocarbamol, ondansetron (ZOFRAN) IV, sodium chloride  BP 105/91  Pulse 81  Temp(Src) 98.1 F (36.7 C) (Oral)  Resp 18  Ht 5\' 9"  (1.753 m)  Wt 83.553 kg (184 lb 3.2 oz)  BMI 27.19 kg/m2  SpO2 99%   PPS: 30%     Intake/Output Summary (Last 24 hours) at 01/18/14 1014 Last data filed at 01/18/14 0844  Gross per 24 hour  Intake    480 ml  Output    875 ml  Net   -395 ml      LBM: 01/15/14      Physical Exam:  General: NAD, well nourished, pleasant and jovial affect  HEENT: /AT, +JVD, moist mucous membranes  Chest: Slight bibasilar crackles, no labored breathing, symmetric  CVS: RRR, S1 S2  Abdomen: Soft, NT, ND, +BS  Ext: MAE, trace BLE edema, warm to touch  Neuro: Awake, alert, oriented x 3, follows commands    Labs: CBC    Component Value Date/Time   WBC 4.7 01/12/2014 1939   RBC 3.96* 01/12/2014 1939   HGB 11.6* 01/12/2014 1939   HCT 33.4* 01/12/2014 1939   PLT 147* 01/12/2014 1939   MCV 84.3 01/12/2014 1939   MCH 29.3 01/12/2014 1939   MCHC 34.7 01/12/2014 1939   RDW 16.5* 01/12/2014 1939   LYMPHSABS 0.4* 12/31/2013 1452   MONOABS 0.5  12/31/2013 1452   EOSABS 0.0 12/31/2013 1452   BASOSABS 0.0 12/31/2013 1452    BMET    Component Value Date/Time   NA 139 01/18/2014 0600   K 3.2* 01/18/2014 0600   CL 96 01/18/2014 0600   CO2 27 01/18/2014 0600   GLUCOSE 88 01/18/2014 0600   BUN 39* 01/18/2014 0600   CREATININE 1.61* 01/18/2014 0600   CALCIUM 7.7* 01/18/2014 0600   GFRNONAA 36* 01/18/2014 0600   GFRAA 42* 01/18/2014 0600    CMP     Component Value Date/Time   NA 139 01/18/2014 0600   K 3.2* 01/18/2014 0600   CL 96 01/18/2014 0600   CO2 27 01/18/2014 0600   GLUCOSE 88 01/18/2014 0600   BUN 39* 01/18/2014 0600   CREATININE 1.61* 01/18/2014 0600   CALCIUM 7.7* 01/18/2014 0600   PROT 6.2 05/01/2011 0635   ALBUMIN 2.3* 05/01/2011 0635   AST 23 05/01/2011 0635   ALT 16 05/01/2011 0635   ALKPHOS 51 05/01/2011 0635   BILITOT 0.5 05/01/2011 0635   GFRNONAA 36* 01/18/2014 0600    GFRAA 42* 01/18/2014 0600     Assessment and Plan: 1. Code Status: CHANGED BACK TO FULL CODE PER PATIENT/FAMILY REQUEST.  2. Symptom Control: 1. Pain: Acetaminophen prn mild pain. Vicodin 5-325 mg 1 tablet every 6 hours prn moderate pain.  2. Muscle spasms: Robaxin TID prn. 3. Nausea: Ondansetron prn.  3. Psycho/Social: Emotional support provided to patient and family.  4. Disposition: Home with home health.     Time In Time Out Total Time Spent with Patient Total Overall Time  1000 1030     Greater than 50%  of this time was spent counseling and coordinating care related to the above assessment and plan.  Yong Channel, NP Palliative Medicine Team Pager # [phone] (M-F 8a-5p) Team Phone # [phone] (Nights/Weekends)   1

## 2014-01-23 ENCOUNTER — Institutional Professional Consult (permissible substitution): Payer: Medicare HMO | Admitting: Pulmonary Disease

## 2014-01-24 ENCOUNTER — Encounter: Payer: Self-pay | Admitting: Interventional Cardiology

## 2014-01-24 ENCOUNTER — Ambulatory Visit (INDEPENDENT_AMBULATORY_CARE_PROVIDER_SITE_OTHER): Payer: Medicare HMO | Admitting: Interventional Cardiology

## 2014-01-24 VITALS — BP 112/78 | HR 98 | Ht 69.0 in | Wt 186.8 lb

## 2014-01-24 DIAGNOSIS — I5022 Chronic systolic (congestive) heart failure: Secondary | ICD-10-CM

## 2014-01-24 DIAGNOSIS — N183 Chronic kidney disease, stage 3 unspecified: Secondary | ICD-10-CM

## 2014-01-24 DIAGNOSIS — I739 Peripheral vascular disease, unspecified: Secondary | ICD-10-CM

## 2014-01-24 DIAGNOSIS — I10 Essential (primary) hypertension: Secondary | ICD-10-CM

## 2014-01-24 LAB — BASIC METABOLIC PANEL
BUN: 44 mg/dL — AB (ref 6–23)
CO2: 27 mEq/L (ref 19–32)
CREATININE: 2 mg/dL — AB (ref 0.4–1.5)
Calcium: 9.3 mg/dL (ref 8.4–10.5)
Chloride: 100 mEq/L (ref 96–112)
GFR: 40.26 mL/min — ABNORMAL LOW (ref >60.00)
Glucose, Bld: 120 mg/dL — ABNORMAL HIGH (ref 70–99)
Potassium: 4.8 mEq/L (ref 3.5–5.1)
Sodium: 138 mEq/L (ref 135–145)

## 2014-01-24 NOTE — Patient Instructions (Signed)
Your physician recommends that you continue on your current medications as directed. Please refer to the Current Medication list given to you today.  Lab Today: Bmet  We will call about possible medication changes based on your lab results  Weigh your self daily each morning. If your weight is greater than 184lb please call the office for instructions  Your physician recommends that you return for lab work in: 1 month  Your physician recommends that you schedule a follow-up appointment on 02/25/14 @ 3:45pm

## 2014-01-24 NOTE — Progress Notes (Signed)
Patient ID: [name] [name], male   DOB: April 28, 1921, 80 y.o.   MRN: [id number]    [address] [place], [place]  [address] Phone: [phone] Fax:  [phone]  Date:  01/24/2014   ID:  [name] [name], DOB Apr 28, 1921, MRN [id number]  PCP:  [name], MD   ASSESSMENT:  1. Chronic systolic heart failure, newly diagnosed 2. Hypertension 3. Chronic kidney disease 4. Elderly and frail with palliative care/hospice discussion during hospitalization. Patient is a DO NOT RESUSCITATE.  PLAN:  1. Basic metabolic panel today 2. Clinical followup in one month 3. Basic metabolic panel on return 4. call if weight increases above 184 on home scales. 5. We will make an adjustment in diuretic regimen based upon today's blood work. I will call him with instructions.   SUBJECTIVE: [name] [name] is a 80 y.o. male who is improving. His weight is now down to 179 pounds on his home scale. He is taking furosemide 40 mg twice a day. Breathing is tremendously better. He denies chest pain. He has not had syncope.   Wt Readings from Last 3 Encounters:  01/24/14 186 lb 12.8 oz (84.732 kg)  01/18/14 184 lb 3.2 oz (83.553 kg)  12/31/13 201 lb 1.9 oz (91.227 kg)     Past Medical History  Diagnosis Date  . Hypertension   . Chronic kidney disease   . PAD (peripheral artery disease)   . Rheumatoid arthritis(714.0)   . CKD (chronic kidney disease), stage III   . Anemia   . Gout   . COPD (chronic obstructive pulmonary disease)   . DVT, lower extremity   . Chronic anticoagulation     Current Outpatient Prescriptions  Medication Sig Dispense Refill  . Acai 25 MG CAPS Take 1 capsule by mouth daily.      Marland Kitchen albuterol (PROVENTIL HFA;VENTOLIN HFA) 108 (90 BASE) MCG/ACT inhaler Inhale 2 puffs into the lungs every 6 (six) hours as needed for wheezing or shortness of breath.      . calcium-vitamin D (OSCAL WITH D) 500-200 MG-UNIT per tablet Take 1 tablet by mouth daily.       . furosemide  (LASIX) 40 MG tablet Take 1 tablet (40 mg total) by mouth 2 (two) times daily.  60 tablet  5  . hydrALAZINE (APRESOLINE) 25 MG tablet Take 1 tablet (25 mg total) by mouth every 8 (eight) hours.  90 tablet  5  . HYDROcodone-acetaminophen (NORCO/VICODIN) 5-325 MG per tablet Take 1 tablet by mouth every 6 (six) hours as needed for moderate pain.      . isosorbide mononitrate (IMDUR) 60 MG 24 hr tablet Take 1 tablet (60 mg total) by mouth daily.  30 tablet  5  . menthol-thymol (ABSORBINE JR) LIQD Apply 1 application topically as needed (for hip pain).      . methocarbamol (ROBAXIN) 500 MG tablet Take 500 mg by mouth 3 (three) times daily as needed for muscle spasms.      . methotrexate (RHEUMATREX) 2.5 MG tablet Take 7.5 mg by mouth every Monday. Caution:Chemotherapy. Protect from light.      . Multiple Vitamin (MULTIVITAMIN) tablet Take 1 tablet by mouth daily.      . potassium chloride 20 MEQ TBCR Take 40 mEq by mouth daily.  60 tablet  5  . predniSONE (DELTASONE) 10 MG tablet Take 20 mg by mouth daily with breakfast.       . Probiotic Product (PROBIOTIC DAILY PO) Take 1 tablet by  mouth daily.      Marland Kitchen warfarin (COUMADIN) 5 MG tablet Take 2.5-5 mg by mouth every morning. Takes 5mg  on Mon and Wed Takes 2.5mg  all other days       No current facility-administered medications for this visit.    Allergies:    Allergies  Allergen Reactions  . Iohexol Itching and Rash         Social History:  The patient  reports that he has quit smoking. He has never used smokeless tobacco. He reports that he does not drink alcohol or use illicit drugs.   ROS:  Please see the history of present illness.   Appetite is stable   All other systems reviewed and negative.   OBJECTIVE: VS:  BP 112/78  Pulse 98  Ht 5\' 9"  (1.753 m)  Wt 186 lb 12.8 oz (84.732 kg)  BMI 27.57 kg/m2 Well nourished, well developed, in no acute distress, elderly but younger than stated age HEENT: normal Neck: JVD flat. Carotid bruit  absent  Cardiac:  normal S1, S2; RRR; no murmur Lungs:  clear to auscultation bilaterally, no wheezing, rhonchi or rales Abd: soft, nontender, no hepatomegaly Ext: Edema absent. Pulses 2+ Skin: warm and dry Neuro:  CNs 2-12 intact, no focal abnormalities noted  EKG:  Not repeated       Signed, 12-23-1995 III, MD 01/24/2014 3:18 PM

## 2014-01-25 ENCOUNTER — Telehealth: Payer: Self-pay

## 2014-01-25 DIAGNOSIS — R0600 Dyspnea, unspecified: Secondary | ICD-10-CM

## 2014-01-25 MED ORDER — POTASSIUM CHLORIDE ER 20 MEQ PO TBCR: 20.0000 meq | EXTENDED_RELEASE_TABLET | Freq: Every day | ORAL | Status: DC

## 2014-01-25 MED ORDER — FUROSEMIDE 40 MG PO TABS: 40.0000 mg | ORAL_TABLET | Freq: Every day | ORAL | Status: DC

## 2014-01-25 NOTE — Telephone Encounter (Signed)
pt aware of lab results and Dr.Smith instructions.patient has mild dehydration and the furosemide and potassium should be reduced to 40mg  and 20 meq daily respectively.pt verbalized understanding.

## 2014-01-25 NOTE — Telephone Encounter (Signed)
Message copied by Jarvis Newcomer on Fri Jan 25, 2014  3:07 PM ------      Message from: Verdis Prime      Created: Fri Jan 25, 2014  2:22 PM       The patient has mild dehydration and the furosemide and potassium should be reduced to 40mg  and 20 meq daily respectively ------

## 2014-01-31 ENCOUNTER — Telehealth: Payer: Self-pay | Admitting: Cardiovascular Disease

## 2014-01-31 NOTE — Telephone Encounter (Signed)
°  Buzz w/[facility] calling patient has gained 5lbs in 3 days needs instruction on what to do with meds. Please call and advise.

## 2014-01-31 NOTE — Telephone Encounter (Signed)
Buzz with [facility] called stating pt has gained 5 lbs in 3 to 4 days. Tuesday pt weigh 179 today he is 184.5 lbs pt C/O of SOB at rest and he notice his face is puffy. Pt denies  edema in LE. Pt is taking  lasix 40 mg and potassium 20 mEq daily; doses  were decreased from twice a day to once daily  on 9/4. Dr. Katrinka Blazing aware and recommends for pt to take 80 mg of lasix once a day and potassium 40 mEq once a day. Buzz with [facility] aware. He verbalized understanding.

## 2014-02-20 ENCOUNTER — Other Ambulatory Visit (HOSPITAL_COMMUNITY): Payer: Self-pay | Admitting: Orthopaedic Surgery

## 2014-02-25 ENCOUNTER — Encounter: Payer: Self-pay | Admitting: Interventional Cardiology

## 2014-02-25 ENCOUNTER — Ambulatory Visit (INDEPENDENT_AMBULATORY_CARE_PROVIDER_SITE_OTHER): Payer: Medicare HMO | Admitting: Interventional Cardiology

## 2014-02-25 VITALS — BP 128/80 | HR 88 | Ht 69.0 in | Wt 190.0 lb

## 2014-02-25 DIAGNOSIS — N183 Chronic kidney disease, stage 3 unspecified: Secondary | ICD-10-CM

## 2014-02-25 DIAGNOSIS — Z23 Encounter for immunization: Secondary | ICD-10-CM

## 2014-02-25 DIAGNOSIS — I739 Peripheral vascular disease, unspecified: Secondary | ICD-10-CM

## 2014-02-25 DIAGNOSIS — I8290 Acute embolism and thrombosis of unspecified vein: Secondary | ICD-10-CM

## 2014-02-25 DIAGNOSIS — Z7901 Long term (current) use of anticoagulants: Secondary | ICD-10-CM

## 2014-02-25 DIAGNOSIS — I5022 Chronic systolic (congestive) heart failure: Secondary | ICD-10-CM

## 2014-02-25 LAB — BASIC METABOLIC PANEL
BUN: 35 mg/dL — ABNORMAL HIGH (ref 6–23)
CHLORIDE: 100 meq/L (ref 96–112)
CO2: 27 mEq/L (ref 19–32)
Calcium: 9.2 mg/dL (ref 8.4–10.5)
Creat: 1.72 mg/dL — ABNORMAL HIGH (ref 0.50–1.35)
Glucose, Bld: 109 mg/dL — ABNORMAL HIGH (ref 70–99)
Potassium: 4.5 mEq/L (ref 3.5–5.3)
SODIUM: 135 meq/L (ref 135–145)

## 2014-02-25 NOTE — Progress Notes (Signed)
Patient ID: [name] [name], male   DOB: 1921/03/30, 80 y.o.   MRN: [id number]    [address] [place], [place]  [address] Phone: [phone] Fax:  [phone]  Date:  02/25/2014   ID:  [name] [name], DOB [birth year], MRN [id number]  PCP:  [name], MD   ASSESSMENT:  1. Systolic heart failure, improved. Laboratory data was abnormal in early September and we decreased furosemide to 40 mg daily. This was followed by a dramatic increase in fluid retention within 3-5 days. We therefore went back to 80 mg per day and he feels breathing is stable. 2. Chronic kidney disease, stage III-IV 3. Chronic anticoagulation for DVT 4. Hypertension, stable 5. Suspect underlying coronary artery disease  PLAN:  1. Based upon today's clinical evaluation, no change in therapy seems indicated. 2. Check laboratory data, basic metabolic panel, today. 3. Clinical followup in 6 weeks 4. Call if syncope, increased dyspnea, or increased chest pain 5. Influenza vaccine today.   SUBJECTIVE: [name] [name] is a 80 y.o. male who is doing relatively well. Still has dyspnea with moderate activity. He is able to lie in bed without dyspnea. He feels he is developed a stable sustained improvement compared to prior to admission. He has not had syncope. Appetite stable. He is concerned about interosseous atrophy in his hands this seems related to cervical disc disease.   Wt Readings from Last 3 Encounters:  02/25/14 190 lb (86.183 kg)  01/24/14 186 lb 12.8 oz (84.732 kg)  01/18/14 184 lb 3.2 oz (83.553 kg)     Past Medical History  Diagnosis Date  . Hypertension   . Chronic kidney disease   . PAD (peripheral artery disease)   . Rheumatoid arthritis(714.0)   . CKD (chronic kidney disease), stage III   . Anemia   . Gout   . COPD (chronic obstructive pulmonary disease)   . DVT, lower extremity   . Chronic anticoagulation     Current Outpatient Prescriptions  Medication Sig Dispense  Refill  . Acai 25 MG CAPS Take 1 capsule by mouth daily.      Marland Kitchen albuterol (PROVENTIL HFA;VENTOLIN HFA) 108 (90 BASE) MCG/ACT inhaler Inhale 2 puffs into the lungs every 6 (six) hours as needed for wheezing or shortness of breath.      . calcium-vitamin D (OSCAL WITH D) 500-200 MG-UNIT per tablet Take 1 tablet by mouth daily.       . furosemide (LASIX) 40 MG tablet Take 1 tablet (40 mg total) by mouth daily.      . hydrALAZINE (APRESOLINE) 25 MG tablet Take 1 tablet (25 mg total) by mouth every 8 (eight) hours.  90 tablet  5  . HYDROcodone-acetaminophen (NORCO/VICODIN) 5-325 MG per tablet Take 1 tablet by mouth every 6 (six) hours as needed for moderate pain.      . isosorbide mononitrate (IMDUR) 60 MG 24 hr tablet Take 1 tablet (60 mg total) by mouth daily.  30 tablet  5  . menthol-thymol (ABSORBINE JR) LIQD Apply 1 application topically as needed (for hip pain).      . methocarbamol (ROBAXIN) 500 MG tablet Take 500 mg by mouth 3 (three) times daily as needed for muscle spasms.      . methotrexate (RHEUMATREX) 2.5 MG tablet Take 7.5 mg by mouth every Monday. Caution:Chemotherapy. Protect from light.      . Multiple Vitamin (MULTIVITAMIN) tablet Take 1 tablet by mouth daily.      . Potassium  Chloride ER 20 MEQ TBCR Take 20 mEq by mouth daily.      . predniSONE (DELTASONE) 10 MG tablet Take 20 mg by mouth daily with breakfast.       . Probiotic Product (PROBIOTIC DAILY PO) Take 1 tablet by mouth daily.      Marland Kitchen warfarin (COUMADIN) 5 MG tablet Take 2.5-5 mg by mouth every morning. Takes 5mg  on Mon and Wed Takes 2.5mg  all other days       No current facility-administered medications for this visit.    Allergies:    Allergies  Allergen Reactions  . Iohexol Itching and Rash         Social History:  The patient  reports that he has quit smoking. He has never used smokeless tobacco. He reports that he does not drink alcohol or use illicit drugs.   ROS:  Please see the history of present illness.    Denies melena. No orthopnea.   All other systems reviewed and negative.   OBJECTIVE: VS:  BP 128/80  Pulse 88  Ht 5\' 9"  (1.753 m)  Wt 190 lb (86.183 kg)  BMI 28.05 kg/m2 Well nourished, well developed, in no acute distress, appears her stated age but somewhat frail appearing HEENT: normal Neck: JVD  flat. Carotid bruit absent  Cardiac:  normal S1, S2; RRR; no murmur Lungs:  clear to auscultation bilaterally, no wheezing, rhonchi or rales Abd: soft, nontender, no hepatomegaly Ext: Edema absent. Pulses 2+ bilateral Skin: warm and dry Neuro:  CNs 2-12 intact, no focal abnormalities noted  EKG:  Not performed       Signed, 12-23-1995 III, MD 02/25/2014 5:09 PM

## 2014-02-25 NOTE — Patient Instructions (Signed)
Your physician recommends that you continue on your current medications as directed. Please refer to the Current Medication list given to you today.   Lab today bmet  You have a follow up appt scheduled on 04/15/14 @ 3:45pm

## 2014-02-28 ENCOUNTER — Telehealth: Payer: Self-pay

## 2014-02-28 NOTE — Telephone Encounter (Signed)
pt aware of lab results.Labs are stable. No change in therapy.pt verbalized understanding. 

## 2014-02-28 NOTE — Telephone Encounter (Signed)
Message copied by Jarvis Newcomer on Thu Feb 28, 2014  2:10 PM ------      Message from: Verdis Prime      Created: Thu Feb 28, 2014  8:18 AM       Labs are stable. No change in therapy ------

## 2014-03-11 ENCOUNTER — Telehealth: Payer: Self-pay | Admitting: Interventional Cardiology

## 2014-03-11 DIAGNOSIS — R0600 Dyspnea, unspecified: Secondary | ICD-10-CM

## 2014-03-11 MED ORDER — FUROSEMIDE 40 MG PO TABS: 80.0000 mg | ORAL_TABLET | Freq: Every day | ORAL | Status: DC

## 2014-03-11 NOTE — Telephone Encounter (Signed)
pt give Dr.Brackbill instructions to increase his fluid intake today. pt adv to call the office tomorrow morning with an update of urine output and weight. pt verbalized understanding.

## 2014-03-11 NOTE — Telephone Encounter (Signed)
Returned pt call pt sts he has had lower back pain that started on 10/17. Pt sts that it is  6 ou 10. Pt sts that pain is localized and does not radiate.pt has been taking Aspirin to help with pain. Pt sts that on 10/17 he noticed he was not having good urine output and could notice swelling in his belly.pt sts that he had decreased sob at that time. Pt sts that he does weigh daily. Pt did not weigh today. Pt weight yesterday morning was 178.8lb which is below his baseline weight of 180lb. Pt bp this morning was 119/66 84 bpm.Pt has been taking lasix 40mg  bid. Pt instructed per his last o/v with Dr.Smith to take 80mg  qd. Pt sts that his sob has still not improved today. Adv pt that Dr.Smith is not in the office today, I will talk with another physician in the office and call back with their recommendations. Pt agreeable and verbalized understanding.

## 2014-03-11 NOTE — Telephone Encounter (Signed)
Returned pt call. Pt sts that he has had lower back pain

## 2014-03-11 NOTE — Telephone Encounter (Signed)
New message     Talk to [name] [name]. The middle part of his back hurt when he breathes deep---the kidney area.  Please advise

## 2014-03-12 ENCOUNTER — Encounter (HOSPITAL_COMMUNITY): Payer: Self-pay | Admitting: Pharmacy Technician

## 2014-03-12 ENCOUNTER — Telehealth: Payer: Self-pay | Admitting: Interventional Cardiology

## 2014-03-12 NOTE — Telephone Encounter (Signed)
Called stating he is still SOB; and pain in back is worse especially when he moves.  States his wt is 180.4 lb. this AM. States the SOB is about the same as yesterday.  States his urine output is good.  He is taking Lasix 80 mg daily.  BP this AM was 109/68; 92 and then 15 min later was 110/71; 102.  Also states he has not had BM since Sunday. Normally goes every day.  Spoke w/Dr. Katrinka Blazing.  He advises for him to increase Lasix to 80 mg BID x 3 days. Also advised pt to take Miralax as needed for constipation.  Also advised per Dr. Katrinka Blazing for him to contact his PCP regarding his back. He verbalizes understanding regarding his Lasix.  He will contact his PCP regarding his back pain.  Also advised to call us back if his SOB does not improve or wt does not stabilize back to normal wt.

## 2014-03-12 NOTE — Telephone Encounter (Signed)
New message           C/o sob , pt states back feels like it is about to break when he moves

## 2014-03-15 ENCOUNTER — Inpatient Hospital Stay (HOSPITAL_COMMUNITY): Admission: RE | Admit: 2014-03-15 | Payer: Medicare HMO | Source: Ambulatory Visit

## 2014-03-19 ENCOUNTER — Encounter (HOSPITAL_COMMUNITY): Admission: RE | Payer: Self-pay | Source: Ambulatory Visit

## 2014-03-19 ENCOUNTER — Ambulatory Visit (HOSPITAL_COMMUNITY): Admission: RE | Admit: 2014-03-19 | Payer: Medicare HMO | Source: Ambulatory Visit | Admitting: Orthopaedic Surgery

## 2014-03-19 SURGERY — AMPUTATION DIGIT
Anesthesia: Choice | Laterality: Left

## 2014-03-21 ENCOUNTER — Ambulatory Visit: Payer: Medicare HMO | Attending: Orthopaedic Surgery

## 2014-03-21 ENCOUNTER — Telehealth: Payer: Self-pay | Admitting: Interventional Cardiology

## 2014-03-21 DIAGNOSIS — M545 Low back pain: Secondary | ICD-10-CM | POA: Insufficient documentation

## 2014-03-21 DIAGNOSIS — M868X7 Other osteomyelitis, ankle and foot: Secondary | ICD-10-CM | POA: Insufficient documentation

## 2014-03-21 DIAGNOSIS — N189 Chronic kidney disease, unspecified: Secondary | ICD-10-CM | POA: Insufficient documentation

## 2014-03-21 DIAGNOSIS — I739 Peripheral vascular disease, unspecified: Secondary | ICD-10-CM | POA: Insufficient documentation

## 2014-03-21 DIAGNOSIS — Z89422 Acquired absence of other left toe(s): Secondary | ICD-10-CM | POA: Insufficient documentation

## 2014-03-21 DIAGNOSIS — I509 Heart failure, unspecified: Secondary | ICD-10-CM | POA: Insufficient documentation

## 2014-03-21 DIAGNOSIS — Z5189 Encounter for other specified aftercare: Secondary | ICD-10-CM | POA: Insufficient documentation

## 2014-03-21 NOTE — Telephone Encounter (Signed)
New Message      Physical therapist calling to notify Dr. Katrinka Blazing of pts vitals during rehab today. HR 100 at rest, BP 88/60, Resp 28 at rest, Resp 36 after walking 70 ft (pt noted sob, Pt did not weigh himself this am. When listening to lungs, noticed crackels in both bases. Just wants Dr. Katrinka Blazing to be advised in case he wants to follow up with pt.

## 2014-03-21 NOTE — Telephone Encounter (Signed)
He has an appointment on 11/23 with you.

## 2014-04-03 ENCOUNTER — Other Ambulatory Visit: Payer: Self-pay | Admitting: Interventional Cardiology

## 2014-04-04 ENCOUNTER — Telehealth: Payer: Self-pay | Admitting: Interventional Cardiology

## 2014-04-04 DIAGNOSIS — R0600 Dyspnea, unspecified: Secondary | ICD-10-CM

## 2014-04-04 MED ORDER — FUROSEMIDE 40 MG PO TABS: 80.0000 mg | ORAL_TABLET | Freq: Every day | ORAL | Status: DC

## 2014-04-04 NOTE — Telephone Encounter (Signed)
New Message        Pt calling stating that he was unable to get his medication refilled b/c the pharmacy states he should still have some medication left. Pt states he doesn't have anymore due to being advised by Dr. Katrinka Blazing to double dosage for Lasic. Pt wants the prescription updated and called in to the pharmacy so he can get his medication. Please call and advise.

## 2014-04-04 NOTE — Telephone Encounter (Signed)
pt aware Rx sent to pt pharmacy for furosemide 80mg  (2..40mg  tab) daily.pt verbalized understanding.

## 2014-04-11 ENCOUNTER — Ambulatory Visit: Payer: Medicare HMO | Attending: Orthopaedic Surgery

## 2014-04-11 ENCOUNTER — Telehealth: Payer: Self-pay | Admitting: *Deleted

## 2014-04-11 VITALS — BP 94/62 | HR 84 | Resp 24

## 2014-04-11 DIAGNOSIS — Z89422 Acquired absence of other left toe(s): Secondary | ICD-10-CM | POA: Insufficient documentation

## 2014-04-11 DIAGNOSIS — I509 Heart failure, unspecified: Secondary | ICD-10-CM | POA: Diagnosis not present

## 2014-04-11 DIAGNOSIS — M545 Low back pain, unspecified: Secondary | ICD-10-CM

## 2014-04-11 DIAGNOSIS — Z5189 Encounter for other specified aftercare: Secondary | ICD-10-CM | POA: Insufficient documentation

## 2014-04-11 DIAGNOSIS — N189 Chronic kidney disease, unspecified: Secondary | ICD-10-CM | POA: Insufficient documentation

## 2014-04-11 DIAGNOSIS — I739 Peripheral vascular disease, unspecified: Secondary | ICD-10-CM | POA: Insufficient documentation

## 2014-04-11 DIAGNOSIS — M868X7 Other osteomyelitis, ankle and foot: Secondary | ICD-10-CM | POA: Insufficient documentation

## 2014-04-11 NOTE — Telephone Encounter (Signed)
Pt requested to come on Tuesday only due to he has no one to watch his wife. gv pt appts for Tuesdays only...td

## 2014-04-11 NOTE — Patient Instructions (Addendum)
ANKLE: Pumps  Point toes down, then up. _15 reps per set, _3_ sets per day, _7__ days per week  Long Arc Quad  Straighten oleg and try to hold it _3___ seconds.  Repeat _15___ times. Do __3__ sessions a day.  FLEXION: Sitting (Active)  Sit, both feet flat. Lift right knee toward ceiling.  Complete _1__ sets of _15__ repetitions. Perform _3__ sessions per day.  Abduction / Adduction  Feet hip width apart, spread thighs out, then bring thighs together. Repeat _15__ times each direction. Do _2__ sessions per day.  Copyright  VHI. All rights reserved.

## 2014-04-11 NOTE — Therapy (Signed)
Physical Therapy Treatment  Patient Details  Name: [name] [name] MRN: [id number] Date of Birth: 11-Jan-1921  Encounter Date: 04/11/2014      PT End of Session - 04/11/14 1235    Visit Number 2   Number of Visits 12   Date for PT Re-Evaluation 05/02/14   PT Start Time 1145   PT Stop Time 1232   PT Time Calculation (min) 47 min   Activity Tolerance Patient tolerated treatment well;Patient limited by fatigue;Other (comment)  Limited by SOB with ther ex. Required multple rest breaks       Past Medical History  Diagnosis Date  . Hypertension   . Chronic kidney disease   . PAD (peripheral artery disease)   . Rheumatoid arthritis(714.0)   . CKD (chronic kidney disease), stage III   . Anemia   . Gout   . COPD (chronic obstructive pulmonary disease)   . DVT, lower extremity   . Chronic anticoagulation     Past Surgical History  Procedure Laterality Date  . Hernia repair  1982    right  . Cataract extraction    . Amputation  05/02/2011    Procedure: AMPUTATION DIGIT;  Surgeon: Kathryne Hitch;  Location: [facility] OR;  Service: Orthopedics;  Laterality: Left;  left great toe  . Colonoscopy w/ biopsies and polypectomy      Hx: of  . Amputation Left 07/03/2013    Procedure: LEFT 2ND TOE AMPUTATION ;  Surgeon: Kathryne Hitch, MD;  Location: [facility] OR;  Service: Orthopedics;  Laterality: Left;  . Esophageal dilation      BP 94/62 mmHg  Pulse 84  Resp 24  Visit Diagnosis:  Midline low back pain without sciatica      Subjective Assessment - 04/11/14 1203    Symptoms Pt reports back pain has decreased since last visit. Pt denies any weight gain. Pt has follow up with MD on 04/15/14. Pain today rates 0/10 at rest and 4/10 at worst with walking and standing. More limited by breathing rather than LBP.   Pertinent History  CAD, PAD CHF,   Limitations Standing;Walking   Currently in Pain? Yes   Pain Score 0-No pain   Pain Location Back   Pain Orientation Lower   Pain  Descriptors / Indicators Aching   Pain Type Chronic pain   Pain Onset More than a month ago   Aggravating Factors  standing and walking    Pain Relieving Factors rest            [facility] Adult PT Treatment/Exercise - 04/11/14 1206    Transfers   Transfers Sit to Stand   Sit to Stand 5: Supervision   Sit to Stand Details (indicate cue type and reason) VCs for proper technique. x 2 reps   Exercises   Exercises Knee/Hip   Knee/Hip Exercises: Standing   Heel Raises 10 reps   Functional Squat 10 reps  mini squat with B UE support on RW.    Other Standing Knee Exercises Marches 10 x each    Knee/Hip Exercises: Seated   Long Arc Quad 15 reps   Other Seated Knee Exercises hip ADD/ABD, flexion/. marches 15 reps    Other Seated Knee Exercises ankle pumps 15 reps seated.      Pt required multiple rest breaks due to SOB during there ex.       PT Education - 04/11/14 1223    Education provided Yes   Education Details Est HEP, Instructed pt to notifyu MD  at next visit of SOB and "dark" sputum.   Person(s) Educated Patient   Methods Explanation;Demonstration;Handout;Verbal cues;Tactile cues   Comprehension Verbalized understanding;Returned demonstration;Tactile cues required;Verbal cues required          PT Short Term Goals - 04/11/14 1244    PT SHORT TERM GOAL #1   Title all STGs= LTGs   Time 2   Period Weeks   Status New   PT SHORT TERM GOAL #2   Title be indep with initial HEP   Time 2   Period Weeks   Status New   PT SHORT TERM GOAL #3   Title report pain decrease by 1 grade with gait   Time 2   Period Weeks   Status New          PT Long Term Goals - 04/11/14 1245    PT LONG TERM GOAL #1   Title Demonstrate and/or verbalize techniques to reduce the risk of re-injury to include info on: fall prevention, body mechanics   Time 6   Period Weeks   Status On-going   PT LONG TERM GOAL #2   Title be indep with HEP    Time 6   Period Weeks   Status On-going   PT  LONG TERM GOAL #3   Title TUG score will improve to 55 secs with RW to decrease risk for falls.    Time 6   Period Weeks   Status On-going   PT LONG TERM GOAL #4   Title Walk 200 feet indep with RW without pain and without change in vital signs   Time 6   Period Weeks   Status On-going   PT LONG TERM GOAL #5   Title FOTO will improve to 53% limited by DC   Time 6   Period Weeks   Status On-going          Plan - 04/11/14 1239    Clinical Impression Statement Pt is limited by SOB, requiring rest breaks between ther ex. MD was notified at last visit and has MD appt for follow up on 04/15/14. Prescribed seated HEP for 3 times a day with written program and pt tolerated well.    Pt will benefit from skilled therapeutic intervention in order to improve on the following deficits Difficulty walking;Decreased strength;Decreased mobility;Pain;Decreased activity tolerance;Decreased endurance   Rehab Potential Good   Clinical Impairments Affecting Rehab Potential Compliance with HEP    PT Frequency 2x / week   PT Duration 6 weeks   PT Treatment/Interventions ADLs/Self Care Home Management;Therapeutic activities;Therapeutic exercise;Gait training;Neuromuscular re-education;Patient/family education;Energy conservation   PT Next Visit Plan Review HEP, Progress HEP. Monitor tolerance to activity and vitals.    Consulted and Agree with Plan of Care Patient        Problem List Patient Active Problem List   Diagnosis Date Noted  . Chronic systolic heart failure 01/24/2014  . Palliative care encounter 01/16/2014  . Chronic anticoagulation   . Acute on chronic combined systolic and diastolic CHF (congestive heart failure) 01/12/2014  . Pulmonary hypertension 10/12/2013  . Tricuspid regurgitation 10/12/2013  . Osteomyelitis of toe of left foot 07/03/2013  . Amputated toe of left foot 07/03/2013  . Zenker diverticulum 06/19/2013  . Debility 06/19/2013  . Fever 06/18/2013  . Influenza A  06/18/2013  . Osteomyelitis of ankle or foot, left, acute 04/30/2011  . Cellulitis and abscess of foot 04/30/2011  . PAD (peripheral artery disease) 04/30/2011  . HTN (hypertension) 04/30/2011  . CKD (  chronic kidney disease), stage III 04/30/2011  . Anemia 04/30/2011  . Rheumatoid arthritis 04/30/2011  . VTE (venous thromboembolism) 04/30/2011                                              Haze Rushing , PT  04/11/2014, 12:50 PM

## 2014-04-12 ENCOUNTER — Telehealth: Payer: Self-pay | Admitting: Interventional Cardiology

## 2014-04-12 NOTE — Telephone Encounter (Signed)
Returned pt call. Pt sts that he has had a productive cough, pt sts that the sputum is colored.pt denies fever,chills.pt wants Dr.Smith to know. Pt sts that his weight this morning is 273lb. Pt weigh is usually around 277lb. Pt reports that he wears  dentures that do not fit well, so he has had a decrease in his appetite.pt reports that his sob is about the same, he is still having dyspnea on exertion.pt adv to f/u with pcp about his productive cough. Pt will keep appt with Dr.Smith on 11/23

## 2014-04-12 NOTE — Telephone Encounter (Signed)
New Message         Pt calling stating that he has some questions that he wants to ask the nurse before he comes in to be seen on 04/15/14 so his questions can be discussed with Dr. Katrinka Blazing before he comes. Please call pt back and advise.

## 2014-04-15 ENCOUNTER — Ambulatory Visit (INDEPENDENT_AMBULATORY_CARE_PROVIDER_SITE_OTHER): Payer: Medicare HMO | Admitting: Interventional Cardiology

## 2014-04-15 ENCOUNTER — Encounter: Payer: Self-pay | Admitting: Interventional Cardiology

## 2014-04-15 ENCOUNTER — Telehealth: Payer: Self-pay

## 2014-04-15 ENCOUNTER — Ambulatory Visit: Payer: Medicare HMO | Admitting: Interventional Cardiology

## 2014-04-15 VITALS — BP 110/90 | HR 95 | Ht 69.0 in | Wt 180.0 lb

## 2014-04-15 DIAGNOSIS — I10 Essential (primary) hypertension: Secondary | ICD-10-CM | POA: Diagnosis not present

## 2014-04-15 DIAGNOSIS — N183 Chronic kidney disease, stage 3 unspecified: Secondary | ICD-10-CM

## 2014-04-15 DIAGNOSIS — I5022 Chronic systolic (congestive) heart failure: Secondary | ICD-10-CM

## 2014-04-15 DIAGNOSIS — Z7901 Long term (current) use of anticoagulants: Secondary | ICD-10-CM | POA: Diagnosis not present

## 2014-04-15 NOTE — Progress Notes (Signed)
Patient ID: [name] [name], male   DOB: 1920-11-25, 80 y.o.   MRN: [id number]    [address] [place], [place]  [address] Phone: [phone] Fax:  [phone]  Date:  04/15/2014   ID:  [name] [name], DOB [birth year], MRN [id number]  PCP:  [name], MD   ASSESSMENT:  1. Chronic systolic heart failure with last EF in August 2015 of 30%. Still with moderate exertional dyspnea. No orthopnea. 2. Essential hypertension, controlled 3. Chronic kidney disease, stage III-IV 4. Elderly and frail  PLAN:   1. If weight on his home scales is greater than 178 pounds and he is having dyspnea he should take an extra 80 mg of furosemide as a second dose later that day. If the weight is 180 or greater he should take an extra 80 mg of furosemide that day whether or not dyspnea is present 2. 2-D Doppler echocardiogram the wrist this LV function now 3-1/2 months on heart failure therapy (not able tolerate beta blocker or ACE inhibitor therapy due to renal impairment and bradycardia 3. Clinical follow-up in 2 months 4. Basic metabolic panel on return   SUBJECTIVE: [name] [name] is a 80 y.o. male chronic dyspnea with some orthopnea. Weight is been for the most part stable on his home scales. It varies between 173 and 177. He rarely has had weights of 180 pounds. He denies chest pain. No significant orthopnea. He has cough each morning when he awakens. There is no peripheral edema. He has not had tachycardia or syncope.   Wt Readings from Last 3 Encounters:  04/15/14 180 lb (81.647 kg)  02/25/14 190 lb (86.183 kg)  01/24/14 186 lb 12.8 oz (84.732 kg)     Past Medical History  Diagnosis Date  . Hypertension   . Chronic kidney disease   . PAD (peripheral artery disease)   . Rheumatoid arthritis(714.0)   . CKD (chronic kidney disease), stage III   . Anemia   . Gout   . COPD (chronic obstructive pulmonary disease)   . DVT, lower extremity   . Chronic anticoagulation      Current Outpatient Prescriptions  Medication Sig Dispense Refill  . Acai 25 MG CAPS Take 1 capsule by mouth daily.    Marland Kitchen albuterol (PROVENTIL HFA;VENTOLIN HFA) 108 (90 BASE) MCG/ACT inhaler Inhale 2 puffs into the lungs every 6 (six) hours as needed for wheezing or shortness of breath.    . calcium-vitamin D (OSCAL WITH D) 500-200 MG-UNIT per tablet Take 1 tablet by mouth daily.     . furosemide (LASIX) 40 MG tablet Take 2 tablets (80 mg total) by mouth daily. 60 tablet 5  . hydrALAZINE (APRESOLINE) 25 MG tablet Take 1 tablet (25 mg total) by mouth every 8 (eight) hours. 90 tablet 5  . HYDROcodone-acetaminophen (NORCO/VICODIN) 5-325 MG per tablet as needed.    . isosorbide mononitrate (IMDUR) 60 MG 24 hr tablet Take 1 tablet (60 mg total) by mouth daily. 30 tablet 5  . menthol-thymol (ABSORBINE JR) LIQD Apply 1 application topically as needed (for hip pain).    . methotrexate (RHEUMATREX) 2.5 MG tablet Take 7.5 mg by mouth every Monday. Caution:Chemotherapy. Protect from light.    . Multiple Vitamin (MULTIVITAMIN) tablet Take 1 tablet by mouth daily.    . Potassium Chloride ER 20 MEQ TBCR Take 20 mEq by mouth daily.    . predniSONE (DELTASONE) 10 MG tablet Take 20 mg by mouth daily with breakfast.     .  Probiotic Product (PROBIOTIC DAILY PO) Take 1 tablet by mouth daily.    Marland Kitchen warfarin (COUMADIN) 5 MG tablet Take 2.5-5 mg by mouth every morning. Takes 5mg  on Wed Takes 2.5mg  all other days     No current facility-administered medications for this visit.    Allergies:    Allergies  Allergen Reactions  . Iohexol Itching and Rash         Social History:  The patient  reports that he has quit smoking. He has never used smokeless tobacco. He reports that he does not drink alcohol or use illicit drugs.   ROS:  Please see the history of present illness.   Appetite is stable. Asking about aortic valve replacement via TAVR. Also know if he has abdominal aortic aneurysm. Denies orthopnea.    All other systems reviewed and negative.   OBJECTIVE: VS:  BP 110/90 mmHg  Pulse 95  Ht 5\' 9"  (1.753 m)  Wt 180 lb (81.647 kg)  BMI 26.57 kg/m2  SpO2 98% Well nourished, well developed, in no acute distress, elderly and somewhat frail HEENT: normal Neck: JVD flat. Carotid bruit absent  Cardiac:  normal S1, S2; RRR; no murmur Lungs:  clear to auscultation bilaterally, no wheezing, rhonchi or rales Abd: soft, nontender, no hepatomegaly Ext: Edema trace to 1+ bilateral. Pulses 2+ Skin: warm and dry Neuro:  CNs 2-12 intact, no focal abnormalities noted  EKG:  Not performed       Signed, 09-06-2001 III, MD 04/15/2014 4:30 PM

## 2014-04-15 NOTE — Telephone Encounter (Signed)
pt aware appt time with Dr.Smith today moved up to 3:30pm.pt verbalized understanding.

## 2014-04-15 NOTE — Patient Instructions (Signed)
Your physician recommends that you continue on your current medications as directed. Please refer to the Current Medication list given to you today.  Your physician has requested that you have an echocardiogram. Echocardiography is a painless test that uses sound waves to create images of your heart. It provides your doctor with information about the size and shape of your heart and how well your heart's chambers and valves are working. This procedure takes approximately one hour. There are no restrictions for this procedure.   Your physician recommends that you weigh, daily, at the same time every day, and in the same amount of clothing. Please record your daily weights.OK to take an extra 40mg  of Lasix if your weight is up 3-5lbs (above 178lbs) if your weight is 180lb or greater take an extra 80mg  of Lasix. If your weight is not back to your baseline weight the next day please call the office  Your physician recommends that you return for lab work in: January  Your physician recommends that you schedule a follow-up appointment the end of January 2016

## 2014-04-15 NOTE — Telephone Encounter (Signed)
The patient was seen in the office today. He is stable. No changes were made.

## 2014-04-20 ENCOUNTER — Inpatient Hospital Stay (HOSPITAL_COMMUNITY)
Admission: EM | Admit: 2014-04-20 | Discharge: 2014-04-25 | DRG: 193 | Disposition: A | Discharge status: Home / Self Care | Payer: Medicare HMO | Attending: Internal Medicine | Admitting: Internal Medicine

## 2014-04-20 ENCOUNTER — Emergency Department (HOSPITAL_COMMUNITY): Payer: Medicare HMO

## 2014-04-20 ENCOUNTER — Encounter (HOSPITAL_COMMUNITY): Payer: Self-pay | Admitting: *Deleted

## 2014-04-20 DIAGNOSIS — K225 Diverticulum of esophagus, acquired: Secondary | ICD-10-CM | POA: Diagnosis present

## 2014-04-20 DIAGNOSIS — Z86718 Personal history of other venous thrombosis and embolism: Secondary | ICD-10-CM | POA: Diagnosis not present

## 2014-04-20 DIAGNOSIS — J9601 Acute respiratory failure with hypoxia: Secondary | ICD-10-CM | POA: Diagnosis present

## 2014-04-20 DIAGNOSIS — D649 Anemia, unspecified: Secondary | ICD-10-CM | POA: Diagnosis present

## 2014-04-20 DIAGNOSIS — Z7901 Long term (current) use of anticoagulants: Secondary | ICD-10-CM

## 2014-04-20 DIAGNOSIS — R0602 Shortness of breath: Secondary | ICD-10-CM

## 2014-04-20 DIAGNOSIS — J440 Chronic obstructive pulmonary disease with acute lower respiratory infection: Secondary | ICD-10-CM | POA: Diagnosis present

## 2014-04-20 DIAGNOSIS — M069 Rheumatoid arthritis, unspecified: Secondary | ICD-10-CM | POA: Diagnosis present

## 2014-04-20 DIAGNOSIS — M109 Gout, unspecified: Secondary | ICD-10-CM | POA: Diagnosis present

## 2014-04-20 DIAGNOSIS — Z87891 Personal history of nicotine dependence: Secondary | ICD-10-CM

## 2014-04-20 DIAGNOSIS — J189 Pneumonia, unspecified organism: Principal | ICD-10-CM | POA: Diagnosis present

## 2014-04-20 DIAGNOSIS — I129 Hypertensive chronic kidney disease with stage 1 through stage 4 chronic kidney disease, or unspecified chronic kidney disease: Secondary | ICD-10-CM | POA: Diagnosis present

## 2014-04-20 DIAGNOSIS — I5023 Acute on chronic systolic (congestive) heart failure: Secondary | ICD-10-CM | POA: Diagnosis present

## 2014-04-20 DIAGNOSIS — I10 Essential (primary) hypertension: Secondary | ICD-10-CM | POA: Diagnosis present

## 2014-04-20 DIAGNOSIS — I272 Other secondary pulmonary hypertension: Secondary | ICD-10-CM | POA: Diagnosis present

## 2014-04-20 DIAGNOSIS — I739 Peripheral vascular disease, unspecified: Secondary | ICD-10-CM | POA: Diagnosis present

## 2014-04-20 DIAGNOSIS — Z9842 Cataract extraction status, left eye: Secondary | ICD-10-CM

## 2014-04-20 DIAGNOSIS — J449 Chronic obstructive pulmonary disease, unspecified: Secondary | ICD-10-CM | POA: Diagnosis present

## 2014-04-20 DIAGNOSIS — N183 Chronic kidney disease, stage 3 unspecified: Secondary | ICD-10-CM | POA: Diagnosis present

## 2014-04-20 DIAGNOSIS — R0600 Dyspnea, unspecified: Secondary | ICD-10-CM

## 2014-04-20 DIAGNOSIS — Y95 Nosocomial condition: Secondary | ICD-10-CM | POA: Diagnosis present

## 2014-04-20 DIAGNOSIS — I8290 Acute embolism and thrombosis of unspecified vein: Secondary | ICD-10-CM | POA: Diagnosis present

## 2014-04-20 DIAGNOSIS — I5022 Chronic systolic (congestive) heart failure: Secondary | ICD-10-CM | POA: Diagnosis present

## 2014-04-20 DIAGNOSIS — R5381 Other malaise: Secondary | ICD-10-CM | POA: Diagnosis present

## 2014-04-20 LAB — CBC
HCT: 38.5 % — ABNORMAL LOW (ref 39.0–52.0)
Hemoglobin: 13.1 g/dL (ref 13.0–17.0)
MCH: 29.3 pg (ref 26.0–34.0)
MCHC: 34 g/dL (ref 30.0–36.0)
MCV: 86.1 fL (ref 78.0–100.0)
PLATELETS: 153 10*3/uL (ref 150–400)
RBC: 4.47 MIL/uL (ref 4.22–5.81)
RDW: 18.8 % — AB (ref 11.5–15.5)
WBC: 5.6 10*3/uL (ref 4.0–10.5)

## 2014-04-20 LAB — BASIC METABOLIC PANEL
Anion gap: 15 (ref 5–15)
BUN: 38 mg/dL — ABNORMAL HIGH (ref 6–23)
CHLORIDE: 102 meq/L (ref 96–112)
CO2: 24 meq/L (ref 19–32)
CREATININE: 1.78 mg/dL — AB (ref 0.50–1.35)
Calcium: 9 mg/dL (ref 8.4–10.5)
GFR calc non Af Amer: 32 mL/min — ABNORMAL LOW (ref >90)
GFR, EST AFRICAN AMERICAN: 37 mL/min — AB (ref >90)
Glucose, Bld: 112 mg/dL — ABNORMAL HIGH (ref 70–99)
Potassium: 4.7 mEq/L (ref 3.7–5.3)
SODIUM: 141 meq/L (ref 137–147)

## 2014-04-20 LAB — I-STAT TROPONIN, ED: Troponin i, poc: 0.02 ng/mL (ref 0.00–0.08)

## 2014-04-20 LAB — PROTIME-INR
INR: 2.68 — AB (ref 0.00–1.49)
PROTHROMBIN TIME: 28.7 s — AB (ref 11.6–15.2)

## 2014-04-20 LAB — PRO B NATRIURETIC PEPTIDE: PRO B NATRI PEPTIDE: 4588 pg/mL — AB (ref 0–450)

## 2014-04-20 MED ORDER — ONDANSETRON HCL 4 MG/2ML IJ SOLN: 4.0000 mg | Freq: Four times a day (QID) | INTRAMUSCULAR | Status: DC | PRN

## 2014-04-20 MED ORDER — ALBUTEROL SULFATE HFA 108 (90 BASE) MCG/ACT IN AERS: 2.0000 | INHALATION_SPRAY | Freq: Four times a day (QID) | RESPIRATORY_TRACT | Status: DC | PRN

## 2014-04-20 MED ORDER — IPRATROPIUM-ALBUTEROL 0.5-2.5 (3) MG/3ML IN SOLN
3.0000 mL | Freq: Once | RESPIRATORY_TRACT | Status: AC
  Administered 2014-04-20: 3 mL via RESPIRATORY_TRACT
  Filled 2014-04-20: qty 3

## 2014-04-20 MED ORDER — IPRATROPIUM-ALBUTEROL 0.5-2.5 (3) MG/3ML IN SOLN
3.0000 mL | Freq: Four times a day (QID) | RESPIRATORY_TRACT | Status: DC
  Administered 2014-04-20 – 2014-04-23 (×10): 3 mL via RESPIRATORY_TRACT
  Filled 2014-04-20 (×10): qty 3

## 2014-04-20 MED ORDER — SODIUM CHLORIDE 0.9 % IV SOLN
INTRAVENOUS | Status: AC
  Administered 2014-04-20: 19:00:00 via INTRAVENOUS

## 2014-04-20 MED ORDER — CALCIUM CARBONATE-VITAMIN D 500-200 MG-UNIT PO TABS
1.0000 | ORAL_TABLET | Freq: Every day | ORAL | Status: DC
  Administered 2014-04-21 – 2014-04-25 (×5): 1 via ORAL
  Filled 2014-04-20 (×6): qty 1

## 2014-04-20 MED ORDER — METHYLPREDNISOLONE SODIUM SUCC 125 MG IJ SOLR
125.0000 mg | Freq: Once | INTRAMUSCULAR | Status: AC
  Administered 2014-04-20: 125 mg via INTRAVENOUS
  Filled 2014-04-20: qty 2

## 2014-04-20 MED ORDER — OXYCODONE HCL 5 MG PO TABS: 5.0000 mg | ORAL_TABLET | ORAL | Status: DC | PRN

## 2014-04-20 MED ORDER — ACAI 25 MG PO CAPS: 1.0000 | ORAL_CAPSULE | Freq: Every day | ORAL | Status: DC

## 2014-04-20 MED ORDER — VANCOMYCIN HCL IN DEXTROSE 1-5 GM/200ML-% IV SOLN
1000.0000 mg | INTRAVENOUS | Status: DC
  Administered 2014-04-20 – 2014-04-24 (×5): 1000 mg via INTRAVENOUS
  Filled 2014-04-20 (×6): qty 200

## 2014-04-20 MED ORDER — METHOTREXATE 2.5 MG PO TABS
7.5000 mg | ORAL_TABLET | ORAL | Status: DC
  Administered 2014-04-22: 7.5 mg via ORAL
  Filled 2014-04-20: qty 3

## 2014-04-20 MED ORDER — ALBUTEROL SULFATE (2.5 MG/3ML) 0.083% IN NEBU: 2.5000 mg | INHALATION_SOLUTION | Freq: Four times a day (QID) | RESPIRATORY_TRACT | Status: DC

## 2014-04-20 MED ORDER — ONDANSETRON HCL 4 MG PO TABS: 4.0000 mg | ORAL_TABLET | Freq: Four times a day (QID) | ORAL | Status: DC | PRN

## 2014-04-20 MED ORDER — PREDNISONE 10 MG PO TABS
10.0000 mg | ORAL_TABLET | Freq: Every day | ORAL | Status: DC
  Administered 2014-04-21 – 2014-04-25 (×5): 10 mg via ORAL
  Filled 2014-04-20 (×6): qty 1

## 2014-04-20 MED ORDER — ALBUTEROL SULFATE (2.5 MG/3ML) 0.083% IN NEBU
2.5000 mg | INHALATION_SOLUTION | RESPIRATORY_TRACT | Status: DC | PRN
  Administered 2014-04-22 (×2): 2.5 mg via RESPIRATORY_TRACT
  Filled 2014-04-20 (×2): qty 3

## 2014-04-20 MED ORDER — WARFARIN - PHARMACIST DOSING INPATIENT: Freq: Every day | Status: DC

## 2014-04-20 MED ORDER — ACETAMINOPHEN 650 MG RE SUPP: 650.0000 mg | Freq: Four times a day (QID) | RECTAL | Status: DC | PRN

## 2014-04-20 MED ORDER — ALUM & MAG HYDROXIDE-SIMETH 200-200-20 MG/5ML PO SUSP: 30.0000 mL | Freq: Four times a day (QID) | ORAL | Status: DC | PRN

## 2014-04-20 MED ORDER — IPRATROPIUM BROMIDE 0.02 % IN SOLN: 0.5000 mg | Freq: Four times a day (QID) | RESPIRATORY_TRACT | Status: DC

## 2014-04-20 MED ORDER — BISACODYL 10 MG RE SUPP: 10.0000 mg | Freq: Every day | RECTAL | Status: DC | PRN

## 2014-04-20 MED ORDER — SENNOSIDES-DOCUSATE SODIUM 8.6-50 MG PO TABS
1.0000 | ORAL_TABLET | Freq: Every evening | ORAL | Status: DC | PRN
  Filled 2014-04-20: qty 1

## 2014-04-20 MED ORDER — ACETAMINOPHEN 325 MG PO TABS: 650.0000 mg | ORAL_TABLET | Freq: Four times a day (QID) | ORAL | Status: DC | PRN

## 2014-04-20 MED ORDER — DEXTROSE 5 % IV SOLN
1.0000 g | INTRAVENOUS | Status: DC
  Administered 2014-04-20 – 2014-04-24 (×5): 1 g via INTRAVENOUS
  Filled 2014-04-20 (×6): qty 1

## 2014-04-20 NOTE — Progress Notes (Signed)
PHARMACIST - PHYSICIAN ORDER COMMUNICATION  CONCERNING: P&T Medication Policy on Herbal Medications  DESCRIPTION:  This patient's order for:  acai  has been noted.  This product(s) is classified as an "herbal" or natural product. Due to a lack of definitive safety studies or FDA approval, nonstandard manufacturing practices, plus the potential risk of unknown drug-drug interactions while on inpatient medications, the Pharmacy and Therapeutics Committee does not permit the use of "herbal" or natural products of this type within [facility].   ACTION TAKEN: The pharmacy department is unable to verify this order at this time and your patient has been informed of this safety policy. Please reevaluate patient's clinical condition at discharge and address if the herbal or natural product(s) should be resumed at that time.  [place], [address].D., BCPS Clinical Pharmacist Pager: [phone] 04/20/2014 6:42 PM

## 2014-04-20 NOTE — Progress Notes (Addendum)
ANTIBIOTIC CONSULT NOTE - INITIAL  Pharmacy Consult for vancomycin + cefepime Indication: rule out pneumonia  Allergies  Allergen Reactions  . Iohexol Itching and Rash         Patient Measurements:   Adjusted Body Weight:   Vital Signs: Temp: 97.6 F (36.4 C) (11/28 1427) Temp Source: Oral (11/28 1427) BP: 105/70 mmHg (11/28 1545) Pulse Rate: 94 (11/28 1545) Intake/Output from previous day:   Intake/Output from this shift:    Labs:  Recent Labs  04/20/14 1528  WBC 5.6  HGB 13.1  PLT 153  CREATININE 1.78*   Estimated Creatinine Clearance: 27 mL/min (by C-G formula based on Cr of 1.78). No results for input(s): VANCOTROUGH, VANCOPEAK, VANCORANDOM, GENTTROUGH, GENTPEAK, GENTRANDOM, TOBRATROUGH, TOBRAPEAK, TOBRARND, AMIKACINPEAK, AMIKACINTROU, AMIKACIN in the last 72 hours.   Microbiology: No results found for this or any previous visit (from the past 720 hour(s)).  Medical History: Past Medical History  Diagnosis Date  . Hypertension   . Chronic kidney disease   . PAD (peripheral artery disease)   . Rheumatoid arthritis(714.0)   . CKD (chronic kidney disease), stage III   . Anemia   . Gout   . COPD (chronic obstructive pulmonary disease)   . DVT, lower extremity   . Chronic anticoagulation     Medications:  Anti-infectives    Start     Dose/Rate Route Frequency Ordered Stop   04/20/14 1700  vancomycin (VANCOCIN) IVPB 1000 mg/200 mL premix     1,000 mg200 mL/hr over 60 Minutes Intravenous Every 24 hours 04/20/14 1637     04/20/14 1700  ceFEPIme (MAXIPIME) 1 g in dextrose 5 % 50 mL IVPB     1 g100 mL/hr over 30 Minutes Intravenous Every 24 hours 04/20/14 1637       Assessment: 89 yom presented to the ED with SOB. To start broad-spectrum antibiotics for treatment of possible HCAP. Pt is afebrile and WBC is WNL. Scr is elevated at 1.78 but known history of CKD.   Vanc 11/28>> Cefepime 11/28>>  Goal of Therapy:  Vancomycin trough level 15-20  mcg/ml  Plan:  1. Vancomycin 1gm IV Q24H 2. Cefepime 1gm IV Q24H 3. F/u renal fxn, C&S, clinical status and trough at [facility]  Jacion Dismore, Drake Leach 04/20/2014,4:38 PM  Addendum: Pt to continue home warfarin for hx DVT. INR is therapeutic and pt already took his dose today.  Plan: 1. No further coumadin tonight 2. Daily INR  Lysle Pearl, PharmD, BCPS Pager # [phone] 04/20/2014 6:31 PM

## 2014-04-20 NOTE — H&P (Signed)
Patient Demographics  [name] [name], is a 80 y.o. male  MRN: [id number]   DOB - [birth year]  Admit Date - 04/20/2014  Outpatient Primary MD for the patient is [name], MD   With History of -  Past Medical History  Diagnosis Date  . Hypertension   . Chronic kidney disease   . PAD (peripheral artery disease)   . Rheumatoid arthritis(714.0)   . CKD (chronic kidney disease), stage III   . Anemia   . Gout   . COPD (chronic obstructive pulmonary disease)   . DVT, lower extremity   . Chronic anticoagulation       Past Surgical History  Procedure Laterality Date  . Hernia repair  1982    right  . Cataract extraction    . Amputation  05/02/2011    Procedure: AMPUTATION DIGIT;  Surgeon: Kathryne Hitch;  Location: [facility] OR;  Service: Orthopedics;  Laterality: Left;  left great toe  . Colonoscopy w/ biopsies and polypectomy      Hx: of  . Amputation Left 07/03/2013    Procedure: LEFT 2ND TOE AMPUTATION ;  Surgeon: Kathryne Hitch, MD;  Location: [facility] OR;  Service: Orthopedics;  Laterality: Left;  . Esophageal dilation      in for   Chief Complaint  Patient presents with  . Shortness of Breath     HPI  [name] [name]  is a 80 y.o. male, with known past medical history of pulmonary hypertension,Donna kidney disease, chronic kidney disease, rheumatoid arthritis, gout, DVT on anticoagulation, started in August of [facility] secondary to acute CHF exacerbation, resents with complaints of shortness of breath, cough, productive sputum, denies any fever or chills, reports blood-tinged sputum as well, patient was afebrile upon presentation, no leukocytosis, but was tachypneic, his x-ray showing evidence of early pneumonia, patient was started on IV vancomycin and cefepime for age HCAP, patient did not have any leukocytosis, creatinine at baseline, had elevated BNP, but it is at his baseline as well on the chest x-ray does not show any evidence of acute CHF, INR  was therapeutic at 2.68.    Review of Systems    In addition to the HPI above,  No Fever-chills, No Headache, No changes with Vision or hearing, No problems swallowing food or Liquids, No Chest pain, and planes of Cough or Shortness of Breath, No Abdominal pain, No Nausea or Vommitting, Bowel movements are regular, No Blood in stool or Urine, No dysuria, No new skin rashes or bruises, No new joints pains-aches,  No new weakness, tingling, numbness in any extremity, complaints of generalized weakness No recent weight gain or loss, No polyuria, polydypsia or polyphagia, No significant Mental Stressors.  A full 10 point Review of Systems was done, except as stated above, all other Review of Systems were negative.   Social History History  Substance Use Topics  . Smoking status: Former Games developer  . Smokeless tobacco: Never Used  . Alcohol Use: No     Family History Family History  Problem Relation Age of Onset  . Hypertension Mother   . Heart disease Father     CAD     Prior to Admission medications   Medication Sig Start Date End Date Taking? Authorizing Provider  Acai 25 MG CAPS Take 1 capsule by mouth daily.   Yes Historical Provider, MD  albuterol (PROVENTIL HFA;VENTOLIN HFA) 108 (90 BASE) MCG/ACT inhaler Inhale 2 puffs into the lungs every 6 (six) hours as needed for  wheezing or shortness of breath.   Yes Historical Provider, MD  calcium-vitamin D (OSCAL WITH D) 500-200 MG-UNIT per tablet Take 1 tablet by mouth daily.    Yes Historical Provider, MD  furosemide (LASIX) 40 MG tablet Take 2 tablets (80 mg total) by mouth daily. 04/04/14  Yes Lyn Records III, MD  hydrALAZINE (APRESOLINE) 25 MG tablet Take 1 tablet (25 mg total) by mouth every 8 (eight) hours. 01/18/14  Yes Brittainy Sherlynn Carbon, PA-C  HYDROcodone-acetaminophen (NORCO/VICODIN) 5-325 MG per tablet as needed. 03/23/14  Yes Historical Provider, MD  isosorbide mononitrate (IMDUR) 60 MG 24 hr tablet Take 1 tablet  (60 mg total) by mouth daily. 01/18/14  Yes Brittainy Sherlynn Carbon, PA-C  menthol-thymol (ABSORBINE JR) LIQD Apply 1 application topically as needed (for hip pain).   Yes Historical Provider, MD  methotrexate (RHEUMATREX) 2.5 MG tablet Take 7.5 mg by mouth every Monday. Caution:Chemotherapy. Protect from light.   Yes Historical Provider, MD  Multiple Vitamin (MULTIVITAMIN) tablet Take 1 tablet by mouth daily.   Yes Historical Provider, MD  Potassium Chloride ER 20 MEQ TBCR Take 20 mEq by mouth daily. 01/25/14  Yes Lyn Records III, MD  predniSONE (DELTASONE) 10 MG tablet Take 10 mg by mouth daily with breakfast.    Yes Historical Provider, MD  Probiotic Product (PROBIOTIC DAILY PO) Take 1 tablet by mouth daily.   Yes Historical Provider, MD  warfarin (COUMADIN) 5 MG tablet Take 2.5-5 mg by mouth every morning. Takes 5mg  on Wed Takes 2.5mg  all other days   Yes Historical Provider, MD    Allergies  Allergen Reactions  . Iohexol Itching and Rash         Physical Exam  Vitals  Blood pressure 110/66, pulse 95, temperature 97.6 F (36.4 C), temperature source Oral, resp. rate 20, SpO2 96 %.   1. General frail elderly male lying in bed in NAD,   2. Normal affect and insight, Not Suicidal or Homicidal, Awake Alert, Oriented X 3.  3. No F.N deficits, ALL C.Nerves Intact, Strength 5/5 all 4 extremities, Sensation intact all 4 extremities, Plantars down going.  4. Ears and Eyes appear Normal, Conjunctivae clear, PERRLA. Moist Oral Mucosa.  5. Supple Neck, No JVD, No cervical lymphadenopathy appriciated, No Carotid Bruits.  6. Symmetrical Chest wall movement, Good air movement bilaterally, scattered rhonchi but no wheezing.  7. RRR, No Gallops, Rubs or Murmurs, No Parasternal Heave.  8. Positive Bowel Sounds, Abdomen Soft, No tenderness, No organomegaly appriciated,No rebound -guarding or rigidity.  9.  No Cyanosis, Normal Skin Turgor, No Skin Rash or Bruise.  10. Good muscle tone,  joints  appear normal , no effusions, Normal ROM. +1 lower extremity edema bilaterally  11. No Palpable Lymph Nodes in Neck or Axillae    Data Review  CBC  Recent Labs Lab 04/20/14 1528  WBC 5.6  HGB 13.1  HCT 38.5*  PLT 153  MCV 86.1  MCH 29.3  MCHC 34.0  RDW 18.8*   ------------------------------------------------------------------------------------------------------------------  Chemistries   Recent Labs Lab 04/20/14 1528  NA 141  K 4.7  CL 102  CO2 24  GLUCOSE 112*  BUN 38*  CREATININE 1.78*  CALCIUM 9.0   ------------------------------------------------------------------------------------------------------------------ estimated creatinine clearance is 27 mL/min (by C-G formula based on Cr of 1.78). ------------------------------------------------------------------------------------------------------------------ No results for input(s): TSH, T4TOTAL, T3FREE, THYROIDAB in the last 72 hours.  Invalid input(s): FREET3   Coagulation profile  Recent Labs Lab 04/20/14 1528  INR 2.68*   -------------------------------------------------------------------------------------------------------------------  No results for input(s): DDIMER in the last 72 hours. -------------------------------------------------------------------------------------------------------------------  Cardiac Enzymes No results for input(s): CKMB, TROPONINI, MYOGLOBIN in the last 168 hours.  Invalid input(s): CK ------------------------------------------------------------------------------------------------------------------ Invalid input(s): POCBNP   ---------------------------------------------------------------------------------------------------------------  Urinalysis    Component Value Date/Time   COLORURINE YELLOW 06/17/2013 2122   APPEARANCEUR CLEAR 06/17/2013 2122   LABSPEC 1.018 06/17/2013 2122   PHURINE 7.5 06/17/2013 2122   GLUCOSEU NEGATIVE 06/17/2013 2122   HGBUR SMALL*  06/17/2013 2122   BILIRUBINUR NEGATIVE 06/17/2013 2122   KETONESUR NEGATIVE 06/17/2013 2122   PROTEINUR 30* 06/17/2013 2122   UROBILINOGEN 0.2 06/17/2013 2122   NITRITE NEGATIVE 06/17/2013 2122   LEUKOCYTESUR NEGATIVE 06/17/2013 2122    ----------------------------------------------------------------------------------------------------------------  Imaging results:   Dg Chest 2 View  04/20/2014   CLINICAL DATA:  Shortness of breath, cough  EXAM: CHEST  2 VIEW  COMPARISON:  01/12/2014  FINDINGS: Patchy left lower lobe opacity, suspicious for pneumonia.  Underlying chronic interstitial markings. No pleural effusion or pneumothorax.  The heart is normal size.  Degenerative changes of the visualized thoracolumbar spine.  IMPRESSION: Patchy left lower lobe opacity, suspicious for pneumonia.   Electronically Signed   By: Charline Bills M.D.   On: 04/20/2014 15:27    M    Assessment & Plan  Principal Problem:   HCAP (healthcare-associated pneumonia) Active Problems:   HTN (hypertension)   CKD (chronic kidney disease), stage III   Rheumatoid arthritis   VTE (venous thromboembolism)   Zenker diverticulum   Debility   Chronic systolic heart failure  Healthcare associated pneumonia -Patient had hospitalization within the last 90 days, so he'll be started on IV vancomycin and cefepime. -Check influenza, follow on blood cultures -Start on nebulizer treatment, when necessary oxygen Mucinex, chest PT  Hypertension -Blood pressure is on the lower side, so will hold antihypertensive medication until he is more stable  Rheumatoid arthritis -Continue with methotrexate and prednisone  VTE -On warfarin with therapeutic INR, pharmacy to dose warfarin  Chronic kidney disease -Appears to be at baseline  Chronic systolic heart failure -Appears to be compensated, no evidence of volume overload on chest x-ray, will hold Lasix to patient is more stable but meanwhile will avoid IV fluids  as well, will give him very minimal fluids total of 500 mL of normal saline over the next 10 hours.   DVT Prophylaxis Warfarin  AM Labs Ordered, also please review Full Orders  Family Communication: Admission, patients condition and plan of care including tests being ordered have been discussed with the patient and son and granddaughter who indicate understanding and agree with the plan and Code Status.  Code Status full  Disposition, admit to telemetry  Condition GUARDED   Time spent in minutes : 60 minutes    Sloane Junkin M.D on 04/20/2014 at 6:10 PM  Between 7am to 7pm - Pager - [phone]  After 7pm go to [email or web address] - password [email or web address]  And look for the night coverage person covering me after hours  [facility] Office  [phone]   **Disclaimer: This note may have been dictated with voice recognition software. Similar sounding words can inadvertently be transcribed and this note may contain transcription errors which may not have been corrected upon publication of note.**

## 2014-04-20 NOTE — ED Notes (Signed)
Pt reports having chf, reports admission to hospital back in august. Having increase in sob. Denies swelling to extremities but is having productive cough with dark colored sputum. Airway intact at triage. ekg done.

## 2014-04-20 NOTE — ED Notes (Signed)
Pt reports SOB for past couple of days with pain in lower back.  Pt denies SOB. Pt placed on 2 L [place] per dr. Blinda Leatherwood.  Blood tinged sputum noted per granddaughter.

## 2014-04-20 NOTE — ED Notes (Signed)
EDP ordered repeat EKG. First EKG completed in triage. Second EKG completed given to EDP.

## 2014-04-20 NOTE — ED Notes (Signed)
Attempted blood cultures.

## 2014-04-20 NOTE — ED Provider Notes (Signed)
CSN: [id number]     Arrival date & time 04/20/14  1414 History   First MD Initiated Contact with Patient 04/20/14 1445     Chief Complaint  Patient presents with  . Shortness of Breath     (Consider location/radiation/quality/duration/timing/severity/associated sxs/prior Treatment) HPI Comments: Patient presents to the ER for evaluation of shortness of breath. Patient reports that he has had increasing shortness of breath for the last couple of days. This has been associated with progressively worsening cough. He reports that dark sputum production. Granddaughter reports that the sputum has been blood-tinged at times. He has not had any known fever. Patient denies any anterior chest pain. He has noticed pain in the lower back that worsens when his shortness of breath worsens.  Patient does have a history of COPD and CHF. He has not had any significant worsening of his leg edema. He has been using his inhalers without any improvement with his breathing.  Patient is a 80 y.o. male presenting with shortness of breath.  Shortness of Breath Associated symptoms: cough   Associated symptoms: no chest pain     Past Medical History  Diagnosis Date  . Hypertension   . Chronic kidney disease   . PAD (peripheral artery disease)   . Rheumatoid arthritis(714.0)   . CKD (chronic kidney disease), stage III   . Anemia   . Gout   . COPD (chronic obstructive pulmonary disease)   . DVT, lower extremity   . Chronic anticoagulation    Past Surgical History  Procedure Laterality Date  . Hernia repair  1982    right  . Cataract extraction    . Amputation  05/02/2011    Procedure: AMPUTATION DIGIT;  Surgeon: Kathryne Hitch;  Location: [facility] OR;  Service: Orthopedics;  Laterality: Left;  left great toe  . Colonoscopy w/ biopsies and polypectomy      Hx: of  . Amputation Left 07/03/2013    Procedure: LEFT 2ND TOE AMPUTATION ;  Surgeon: Kathryne Hitch, MD;  Location: [facility] OR;  Service:  Orthopedics;  Laterality: Left;  . Esophageal dilation     Family History  Problem Relation Age of Onset  . Hypertension Mother   . Heart disease Father     CAD   History  Substance Use Topics  . Smoking status: Former Games developer  . Smokeless tobacco: Never Used  . Alcohol Use: No    Review of Systems  Respiratory: Positive for cough and shortness of breath.   Cardiovascular: Positive for leg swelling. Negative for chest pain.  All other systems reviewed and are negative.     Allergies  Iohexol  Home Medications   Prior to Admission medications   Medication Sig Start Date End Date Taking? Authorizing Provider  Acai 25 MG CAPS Take 1 capsule by mouth daily.   Yes Historical Provider, MD  albuterol (PROVENTIL HFA;VENTOLIN HFA) 108 (90 BASE) MCG/ACT inhaler Inhale 2 puffs into the lungs every 6 (six) hours as needed for wheezing or shortness of breath.   Yes Historical Provider, MD  calcium-vitamin D (OSCAL WITH D) 500-200 MG-UNIT per tablet Take 1 tablet by mouth daily.    Yes Historical Provider, MD  furosemide (LASIX) 40 MG tablet Take 2 tablets (80 mg total) by mouth daily. 04/04/14  Yes Lyn Records III, MD  hydrALAZINE (APRESOLINE) 25 MG tablet Take 1 tablet (25 mg total) by mouth every 8 (eight) hours. 01/18/14  Yes Brittainy Sherlynn Carbon, PA-C  HYDROcodone-acetaminophen (NORCO/VICODIN) 5-325 MG per  tablet as needed. 03/23/14  Yes Historical Provider, MD  isosorbide mononitrate (IMDUR) 60 MG 24 hr tablet Take 1 tablet (60 mg total) by mouth daily. 01/18/14  Yes Brittainy Sherlynn Carbon, PA-C  menthol-thymol (ABSORBINE JR) LIQD Apply 1 application topically as needed (for hip pain).   Yes Historical Provider, MD  methotrexate (RHEUMATREX) 2.5 MG tablet Take 7.5 mg by mouth every Monday. Caution:Chemotherapy. Protect from light.   Yes Historical Provider, MD  Multiple Vitamin (MULTIVITAMIN) tablet Take 1 tablet by mouth daily.   Yes Historical Provider, MD  Potassium Chloride ER 20 MEQ  TBCR Take 20 mEq by mouth daily. 01/25/14  Yes Lyn Records III, MD  predniSONE (DELTASONE) 10 MG tablet Take 10 mg by mouth daily with breakfast.    Yes Historical Provider, MD  Probiotic Product (PROBIOTIC DAILY PO) Take 1 tablet by mouth daily.   Yes Historical Provider, MD  warfarin (COUMADIN) 5 MG tablet Take 2.5-5 mg by mouth every morning. Takes 5mg  on Wed Takes 2.5mg  all other days   Yes Historical Provider, MD   BP 105/70 mmHg  Pulse 94  Temp(Src) 97.6 F (36.4 C) (Oral)  Resp 19  SpO2 99% Physical Exam  Constitutional: He is oriented to person, place, and time. He appears well-developed and well-nourished. No distress.  HENT:  Head: Normocephalic and atraumatic.  Right Ear: Hearing normal.  Left Ear: Hearing normal.  Nose: Nose normal.  Mouth/Throat: Oropharynx is clear and moist and mucous membranes are normal.  Eyes: Conjunctivae and EOM are normal. Pupils are equal, round, and reactive to light.  Neck: Normal range of motion. Neck supple.  Cardiovascular: Regular rhythm, S1 normal and S2 normal.  Exam reveals no gallop and no friction rub.   No murmur heard. Pulmonary/Chest: Effort normal. No respiratory distress. He has wheezes. He has rales. He exhibits no tenderness.  Abdominal: Soft. Normal appearance and bowel sounds are normal. There is no hepatosplenomegaly. There is no tenderness. There is no rebound, no guarding, no tenderness at McBurney's point and negative Murphy's sign. No hernia.  Musculoskeletal: Normal range of motion. He exhibits edema (1+ bilateral).  Neurological: He is alert and oriented to person, place, and time. He has normal strength. No cranial nerve deficit or sensory deficit. Coordination normal. GCS eye subscore is 4. GCS verbal subscore is 5. GCS motor subscore is 6.  Skin: Skin is warm, dry and intact. No rash noted. No cyanosis.  Psychiatric: He has a normal mood and affect. His speech is normal and behavior is normal. Thought content normal.   Nursing note and vitals reviewed.   ED Course  Procedures (including critical care time) Labs Review Labs Reviewed  CBC - Abnormal; Notable for the following:    HCT 38.5 (*)    RDW 18.8 (*)    All other components within normal limits  BASIC METABOLIC PANEL - Abnormal; Notable for the following:    Glucose, Bld 112 (*)    BUN 38 (*)    Creatinine, Ser 1.78 (*)    GFR calc non Af Amer 32 (*)    GFR calc Af Amer 37 (*)    All other components within normal limits  PRO B NATRIURETIC PEPTIDE - Abnormal; Notable for the following:    Pro B Natriuretic peptide (BNP) 4588.0 (*)    All other components within normal limits  PROTIME-INR - Abnormal; Notable for the following:    Prothrombin Time 28.7 (*)    INR 2.68 (*)    All  other components within normal limits  CULTURE, BLOOD (ROUTINE X 2)  CULTURE, BLOOD (ROUTINE X 2)  I-STAT TROPOININ, ED    Imaging Review Dg Chest 2 View  04/20/2014   CLINICAL DATA:  Shortness of breath, cough  EXAM: CHEST  2 VIEW  COMPARISON:  01/12/2014  FINDINGS: Patchy left lower lobe opacity, suspicious for pneumonia.  Underlying chronic interstitial markings. No pleural effusion or pneumothorax.  The heart is normal size.  Degenerative changes of the visualized thoracolumbar spine.  IMPRESSION: Patchy left lower lobe opacity, suspicious for pneumonia.   Electronically Signed   By: Charline Bills M.D.   On: 04/20/2014 15:27     EKG Interpretation   Date/Time:  Saturday April 20 2014 15:55:00 EST Ventricular Rate:  93 PR Interval:  171 QRS Duration: 115 QT Interval:  394 QTC Calculation: 490 R Axis:   -21 Text Interpretation:  Sinus tachycardia Atrial premature complexes  Consider left atrial enlargement Incomplete left bundle branch block  Anterior Q waves, possibly due to ILBBB Baseline wander in lead(s) V3 No  significant change since last tracing Confirmed by Kentarius Partington  MD,  Kelsi Benham [phone]) on 04/20/2014 3:58:03 PM        MDM    Final diagnoses:  HCAP (healthcare-associated pneumonia)    Is as to the ER for evaluation of shortness of breath and cough. Patient reports that he has had progressively worsening shortness of breath for the past couple of days. Cough is productive of thick and dark sputum, occasionally blood-tinged. Patient is not exhibiting significant hypoxia here in the ER, but is very Neck and out of breath with minimal movements. Family concerned about previous history of congestive heart failure. BNP is elevated, but chest x-ray is more consistent with pneumonia in the left lower lobe. Clinically, diagnosis of pneumonia is more likely. Patient was hospitalized at the end of August, is right at the 25 day Mark with previous hospitalization. Will treat for healthcare associated pneumonia. Will ask for admission for further treatment.    Gilda Crease, MD 04/20/14 [phone]

## 2014-04-20 NOTE — ED Notes (Signed)
Attempted report 

## 2014-04-21 LAB — CBC
HCT: 33.6 % — ABNORMAL LOW (ref 39.0–52.0)
Hemoglobin: 11.3 g/dL — ABNORMAL LOW (ref 13.0–17.0)
MCH: 28.5 pg (ref 26.0–34.0)
MCHC: 33.6 g/dL (ref 30.0–36.0)
MCV: 84.8 fL (ref 78.0–100.0)
PLATELETS: 135 10*3/uL — AB (ref 150–400)
RBC: 3.96 MIL/uL — AB (ref 4.22–5.81)
RDW: 18.4 % — ABNORMAL HIGH (ref 11.5–15.5)
WBC: 3.3 10*3/uL — ABNORMAL LOW (ref 4.0–10.5)

## 2014-04-21 LAB — PROTIME-INR
INR: 2.98 — AB (ref 0.00–1.49)
PROTHROMBIN TIME: 31.2 s — AB (ref 11.6–15.2)

## 2014-04-21 LAB — GLUCOSE, CAPILLARY
GLUCOSE-CAPILLARY: 184 mg/dL — AB (ref 70–99)
Glucose-Capillary: 148 mg/dL — ABNORMAL HIGH (ref 70–99)

## 2014-04-21 LAB — BASIC METABOLIC PANEL
ANION GAP: 16 — AB (ref 5–15)
BUN: 37 mg/dL — ABNORMAL HIGH (ref 6–23)
CALCIUM: 8.4 mg/dL (ref 8.4–10.5)
CO2: 23 meq/L (ref 19–32)
Chloride: 103 mEq/L (ref 96–112)
Creatinine, Ser: 1.52 mg/dL — ABNORMAL HIGH (ref 0.50–1.35)
GFR calc Af Amer: 44 mL/min — ABNORMAL LOW (ref >90)
GFR calc non Af Amer: 38 mL/min — ABNORMAL LOW (ref >90)
GLUCOSE: 161 mg/dL — AB (ref 70–99)
POTASSIUM: 4.2 meq/L (ref 3.7–5.3)
SODIUM: 142 meq/L (ref 137–147)

## 2014-04-21 LAB — INFLUENZA PANEL BY PCR (TYPE A & B)
H1N1FLUPCR: NOT DETECTED
INFLAPCR: NEGATIVE
Influenza B By PCR: NEGATIVE

## 2014-04-21 LAB — MAGNESIUM: Magnesium: 2.1 mg/dL (ref 1.5–2.5)

## 2014-04-21 MED ORDER — WARFARIN 1.25 MG HALF TABLET
1.2500 mg | ORAL_TABLET | Freq: Once | ORAL | Status: AC
  Administered 2014-04-21: 1.25 mg via ORAL
  Filled 2014-04-21: qty 1

## 2014-04-21 MED ORDER — DM-GUAIFENESIN ER 30-600 MG PO TB12
1.0000 | ORAL_TABLET | Freq: Two times a day (BID) | ORAL | Status: DC
  Administered 2014-04-21 – 2014-04-25 (×9): 1 via ORAL
  Filled 2014-04-21 (×10): qty 1

## 2014-04-21 MED ORDER — CETYLPYRIDINIUM CHLORIDE 0.05 % MT LIQD
7.0000 mL | Freq: Two times a day (BID) | OROMUCOSAL | Status: DC
Start: 2014-04-21 — End: 2014-04-22
  Administered 2014-04-22: 7 mL via OROMUCOSAL

## 2014-04-21 MED ORDER — PNEUMOCOCCAL VAC POLYVALENT 25 MCG/0.5ML IJ INJ
0.5000 mL | INJECTION | INTRAMUSCULAR | Status: AC
  Administered 2014-04-22: 0.5 mL via INTRAMUSCULAR
  Filled 2014-04-21: qty 0.5

## 2014-04-21 NOTE — Progress Notes (Signed)
Utilization Review Completed.   Jaxyn Mestas, RN, BSN Nurse Case Manager  

## 2014-04-21 NOTE — Evaluation (Signed)
Physical Therapy Evaluation Patient Details Name: [name] [name] MRN: [id number] DOB: 1920/10/16 Today's Date: 04/21/2014   History of Present Illness  [name] [name]  is a 80 y.o. male, with known past medical history of pulmonary hypertension,Donna kidney disease, chronic kidney disease, rheumatoid arthritis, gout, DVT on anticoagulation, started in August of [facility] secondary to acute CHF exacerbation, presents with complaints of shortness of breath, cough, productive sputum, his x-ray showing evidence of early pneumonia,  Clinical Impression  Pt admitted with/for SOB found to be due to PNA.  Pt currently limited functionally due to the problems listed below.  (see problems list.)  Pt will benefit from PT to maximize function and safety to be able to get home safely with available assist of family.     Follow Up Recommendations Home health PT;Supervision for mobility/OOB    Equipment Recommendations  None recommended by PT    Recommendations for Other Services       Precautions / Restrictions        Mobility  Bed Mobility Overal bed mobility: Needs Assistance Bed Mobility: Sit to Supine       Sit to supine: Modified independent (Device/Increase time)   General bed mobility comments: Mod I into bed,  getting oob not addressed  Transfers Overall transfer level: Needs assistance Equipment used: None;Rolling walker (2 wheeled) Transfers: Sit to/from Stand Sit to Stand: Supervision         General transfer comment: moves well from chair and toilet.  Steady doing self pericare  Ambulation/Gait Ambulation/Gait assistance: Min guard Ambulation Distance (Feet): 140 Feet Assistive device: Rolling walker (2 wheeled) Gait Pattern/deviations: Step-through pattern;Decreased step length - right;Decreased step length - left;Decreased stride length Gait velocity: slower   General Gait Details: mildly unsteady, but safe with RW  Stairs            Wheelchair  Mobility    Modified Rankin (Stroke Patients Only)       Balance Overall balance assessment: Needs assistance Sitting-balance support: No upper extremity supported Sitting balance-Leahy Scale: Normal     Standing balance support: No upper extremity supported;Single extremity supported Standing balance-Leahy Scale: Good Standing balance comment: did his own pericare post bowel movement                             Pertinent Vitals/Pain Pain Assessment: No/denies pain    Home Living Family/patient expects to be discharged to:: Private residence Living Arrangements: Spouse/significant other Available Help at Discharge: Family;Available PRN/intermittently Type of Home: House Home Access: Stairs to enter Entrance Stairs-Rails: None Entrance Stairs-Number of Steps: 3 Home Layout: One level Home Equipment: Walker - 2 wheels;Cane - single point;Shower seat Additional Comments: Pt has daughter and son that lives with him and wife. They both work and are available intermitterntly. Pt is primary caregiver for wife who has advanced Alzheimer's.     Prior Function Level of Independence: Independent with assistive device(s)         Comments: Has started using RW.  Not driving at this time.  Has been primary caregiver to his wife.  Son and Daughter assisting     Hand Dominance   Dominant Hand: Right    Extremity/Trunk Assessment   Upper Extremity Assessment: Overall WFL for tasks assessed           Lower Extremity Assessment: Overall WFL for tasks assessed (mild weaknesses from less activity)         Communication  Communication: No difficulties  Cognition Arousal/Alertness: Awake/alert Behavior During Therapy: WFL for tasks assessed/performed Overall Cognitive Status: Within Functional Limits for tasks assessed                      General Comments      Exercises        Assessment/Plan    PT Assessment Patient needs continued PT  services  PT Diagnosis Generalized weakness;Other (comment) (decreased activity tolerance, deconditioning)   PT Problem List Decreased strength;Decreased activity tolerance;Decreased balance;Decreased mobility;Decreased knowledge of use of DME;Cardiopulmonary status limiting activity  PT Treatment Interventions DME instruction;Gait training;Stair training;Functional mobility training;Therapeutic activities;Balance training;Patient/family education   PT Goals (Current goals can be found in the Care Plan section) Acute Rehab PT Goals Patient Stated Goal: Able to help take care of my wife PT Goal Formulation: With patient Time For Goal Achievement: 04/28/14 Potential to Achieve Goals: Good    Frequency Min 3X/week   Barriers to discharge        Co-evaluation               End of Session   Activity Tolerance: Patient tolerated treatment well;Other (comment) (notable SOB) Patient left: in bed;with call bell/phone within reach;with family/visitor present Nurse Communication: Mobility status         Time: 1431-1455 PT Time Calculation (min) (ACUTE ONLY): 24 min   Charges:   PT Evaluation $Initial PT Evaluation Tier I: 1 Procedure PT Treatments $Gait Training: 8-22 mins   PT G Codes:          [name] [name], [name] [name] 04/21/2014, 3:10 PM 04/21/2014  [name] [name], PT [phone]  (pager)

## 2014-04-21 NOTE — Progress Notes (Signed)
The patient had a 5 beat run of wide QRS complex tachycardia.  He was asymptomatic and asleep at the time.  [name] [name] was notified.

## 2014-04-21 NOTE — Progress Notes (Signed)
Pt given flutter valve & instruction. Pt is eating at this time.

## 2014-04-21 NOTE — Progress Notes (Signed)
ANTICOAGULATION CONSULT NOTE - Follow Up Consult  Pharmacy Consult for warfarin Indication: hx DVT  Allergies  Allergen Reactions  . Iohexol Itching and Rash         Patient Measurements: Height: 5\' 9"  (175.3 cm) Weight: 175 lb 12.8 oz (79.742 kg) (SCALE b) IBW/kg (Calculated) : 70.7  Vital Signs: Temp: 98.1 F (36.7 C) (11/29 0622) Temp Source: Oral (11/29 0622) BP: 106/71 mmHg (11/29 0622) Pulse Rate: 98 (11/29 0622)  Labs:  Recent Labs  04/20/14 1528 04/21/14 0250  HGB 13.1 11.3*  HCT 38.5* 33.6*  PLT 153 135*  LABPROT 28.7* 31.2*  INR 2.68* 2.98*  CREATININE 1.78* 1.52*    Estimated Creatinine Clearance: 31.7 mL/min (by C-G formula based on Cr of 1.52).  Assessment: 80 y/o male on chronic warfarin for hx DVT. INR is 2.98 and therapeutic on the upper end of goal this morning. No bleeding noted, Hb has trended down to 11.3, platelets have also trended down to 135 - watch closely.  Goal of Therapy:  INR 2-3 Monitor platelets by anticoagulation protocol: Yes   Plan:  - Warfarin 1.25 mg PO tonight - INR daily - Monitor for s/sx of bleeding  [facility], [facility].D., BCPS Clinical Pharmacist Pager: [phone] 04/21/2014 10:45 AM

## 2014-04-21 NOTE — Progress Notes (Signed)
Patient Demographics  [name] [name], is a 80 y.o. male, DOB - 18-Jun-1920, QPY:[id number]  Admit date - 04/20/2014   Admitting Physician Huey Bienenstock, MD  Outpatient Primary MD for the patient is [name], MD  LOS - 1   Chief Complaint  Patient presents with  . Shortness of Breath      Admission history of present illness/brief narrative: [name] [name] is a 80 y.o. male, with known past medical history of pulmonary hypertension,Donna kidney disease, chronic kidney disease, rheumatoid arthritis, gout, DVT on anticoagulation, started in August of [facility] secondary to acute CHF exacerbation, presents with complaints of shortness of breath, cough, productive sputum, his x-ray showing evidence of early pneumonia, patient was started on IV vancomycin and cefepime for age HCAP, patient did not have any leukocytosis, creatinine at baseline, had elevated BNP, but it is at his baseline as well on the chest x-ray does not show any evidence of acute CHF, INR was therapeutic at 2.68.  Subjective:   [name] [name] today has, No headache, No chest pain, No abdominal pain - No Nausea, No new weakness tingling or numbness, still having productive cough, with mild blood-tinged sputum.  Assessment & Plan    Principal Problem:   HCAP (healthcare-associated pneumonia) Active Problems:   HTN (hypertension)   CKD (chronic kidney disease), stage III   Rheumatoid arthritis   VTE (venous thromboembolism)   Zenker diverticulum   Debility   Chronic systolic heart failure  Healthcare associated pneumonia -Patient had hospitalization within the last 90 days, so continue IV vancomycin and cefepime (11/28) -Follow on influenza, follow on blood cultures -on nebulizer treatment, when necessary oxygen Mucinex, chest PT  Hypertension -Blood pressure is on the lower side,  so will continue hold antihypertensive medication until he is more stable  Rheumatoid arthritis -Continue with methotrexate and prednisone  VTE -On warfarin with therapeutic INR, pharmacy to dose warfarin  Chronic kidney disease -Appears to be at baseline  Chronic systolic heart failure -Appears to be compensated, no evidence of volume overload on chest x-ray,  - Would resume diuresis in a.m. if remains stable.  Code Status: Full  Family Communication: None at bedside   Disposition Plan: Remains on telemetry   Procedures  None   Consults   None   Medications  Scheduled Meds: . antiseptic oral rinse  7 mL Mouth Rinse BID  . calcium-vitamin D  1 tablet Oral Q breakfast  . ceFEPime (MAXIPIME) IV  1 g Intravenous Q24H  . ipratropium-albuterol  3 mL Nebulization Q6H  . [START ON 04/22/2014] methotrexate  7.5 mg Oral Q Mon  . [START ON 04/22/2014] pneumococcal 23 valent vaccine  0.5 mL Intramuscular Tomorrow-1000  . predniSONE  10 mg Oral Q breakfast  . vancomycin  1,000 mg Intravenous Q24H  . warfarin  1.25 mg Oral ONCE-1800  . Warfarin - Pharmacist Dosing Inpatient   Does not apply q1800   Continuous Infusions:  PRN Meds:.acetaminophen **OR** acetaminophen, albuterol, alum & mag hydroxide-simeth, bisacodyl, ondansetron **OR** ondansetron (ZOFRAN) IV, oxyCODONE, senna-docusate  DVT Prophylaxis  on warfarin  Lab Results  Component Value Date   PLT 135* 04/21/2014    Antibiotics   Anti-infectives    Start     Dose/Rate Route Frequency  Ordered Stop   04/20/14 1700  vancomycin (VANCOCIN) IVPB 1000 mg/200 mL premix     1,000 mg200 mL/hr over 60 Minutes Intravenous Every 24 hours 04/20/14 1637     04/20/14 1700  ceFEPIme (MAXIPIME) 1 g in dextrose 5 % 50 mL IVPB     1 g100 mL/hr over 30 Minutes Intravenous Every 24 hours 04/20/14 1637            Objective:   Filed Vitals:   04/21/14 0227 04/21/14 0622 04/21/14 0741 04/21/14 0900  BP: 110/69 106/71  110/62    Pulse: 96 98  102  Temp: 97.6 F (36.4 C) 98.1 F (36.7 C)  98.5 F (36.9 C)  TempSrc: Oral Oral  Oral  Resp: 18 18  18   Height:      Weight:  79.742 kg (175 lb 12.8 oz)    SpO2: 95% 99% 97% 92%    Wt Readings from Last 3 Encounters:  04/21/14 79.742 kg (175 lb 12.8 oz)  04/15/14 81.647 kg (180 lb)  02/25/14 86.183 kg (190 lb)     Intake/Output Summary (Last 24 hours) at 04/21/14 1124 Last data filed at 04/21/14 0544  Gross per 24 hour  Intake    650 ml  Output    675 ml  Net    -25 ml     Physical Exam  Awake Alert, Oriented X 3, No new F.N deficits, Normal affect [place].AT,PERRAL Supple Neck,No JVD, No cervical lymphadenopathy appriciated.  Symmetrical Chest wall movement, Good air movement bilaterally,  RRR,No Gallops,Rubs or new Murmurs, No Parasternal Heave +ve B.Sounds, Abd Soft, No tenderness, No organomegaly appriciated, No rebound - guarding or rigidity. No Cyanosis, Clubbing , No new Rash or bruise  , bilateral pedal edema  Data Review   Micro Results No results found for this or any previous visit (from the past 240 hour(s)).  Radiology Reports Dg Chest 2 View  04/20/2014   CLINICAL DATA:  Shortness of breath, cough  EXAM: CHEST  2 VIEW  COMPARISON:  01/12/2014  FINDINGS: Patchy left lower lobe opacity, suspicious for pneumonia.  Underlying chronic interstitial markings. No pleural effusion or pneumothorax.  The heart is normal size.  Degenerative changes of the visualized thoracolumbar spine.  IMPRESSION: Patchy left lower lobe opacity, suspicious for pneumonia.   Electronically Signed   By: Charline Bills M.D.   On: 04/20/2014 15:27    CBC  Recent Labs Lab 04/20/14 1528 04/21/14 0250  WBC 5.6 3.3*  HGB 13.1 11.3*  HCT 38.5* 33.6*  PLT 153 135*  MCV 86.1 84.8  MCH 29.3 28.5  MCHC 34.0 33.6  RDW 18.8* 18.4*    Chemistries   Recent Labs Lab 04/20/14 1528 04/21/14 0250  NA 141 142  K 4.7 4.2  CL 102 103  CO2 24 23  GLUCOSE 112* 161*   BUN 38* 37*  CREATININE 1.78* 1.52*  CALCIUM 9.0 8.4  MG  --  2.1   ------------------------------------------------------------------------------------------------------------------ estimated creatinine clearance is 31.7 mL/min (by C-G formula based on Cr of 1.52). ------------------------------------------------------------------------------------------------------------------ No results for input(s): HGBA1C in the last 72 hours. ------------------------------------------------------------------------------------------------------------------ No results for input(s): CHOL, HDL, LDLCALC, TRIG, CHOLHDL, LDLDIRECT in the last 72 hours. ------------------------------------------------------------------------------------------------------------------ No results for input(s): TSH, T4TOTAL, T3FREE, THYROIDAB in the last 72 hours.  Invalid input(s): FREET3 ------------------------------------------------------------------------------------------------------------------ No results for input(s): VITAMINB12, FOLATE, FERRITIN, TIBC, IRON, RETICCTPCT in the last 72 hours.  Coagulation profile  Recent Labs Lab 04/20/14 1528 04/21/14 0250  INR 2.68* 2.98*  No results for input(s): DDIMER in the last 72 hours.  Cardiac Enzymes No results for input(s): CKMB, TROPONINI, MYOGLOBIN in the last 168 hours.  Invalid input(s): CK ------------------------------------------------------------------------------------------------------------------ Invalid input(s): POCBNP     Time Spent in minutes   30 minutes   [name] [name] M.D on 04/21/2014 at 11:24 AM  Between 7am to 7pm - Pager - [phone]  After 7pm go to [email or web address] - password [email or web address]  And look for the night coverage person covering for me after hours  [facility] Office  [phone]   **Disclaimer: This note may have been dictated with voice recognition software. Similar sounding words can inadvertently be  transcribed and this note may contain transcription errors which may not have been corrected upon publication of note.**

## 2014-04-22 ENCOUNTER — Inpatient Hospital Stay (HOSPITAL_COMMUNITY): Payer: Medicare HMO

## 2014-04-22 DIAGNOSIS — J189 Pneumonia, unspecified organism: Secondary | ICD-10-CM | POA: Diagnosis not present

## 2014-04-22 LAB — BASIC METABOLIC PANEL
ANION GAP: 18 — AB (ref 5–15)
BUN: 39 mg/dL — ABNORMAL HIGH (ref 6–23)
CALCIUM: 8.6 mg/dL (ref 8.4–10.5)
CO2: 20 mEq/L (ref 19–32)
CREATININE: 1.5 mg/dL — AB (ref 0.50–1.35)
Chloride: 98 mEq/L (ref 96–112)
GFR, EST AFRICAN AMERICAN: 45 mL/min — AB (ref >90)
GFR, EST NON AFRICAN AMERICAN: 39 mL/min — AB (ref >90)
Glucose, Bld: 131 mg/dL — ABNORMAL HIGH (ref 70–99)
Potassium: 3.8 mEq/L (ref 3.7–5.3)
Sodium: 136 mEq/L — ABNORMAL LOW (ref 137–147)

## 2014-04-22 LAB — CBC
HCT: 35.4 % — ABNORMAL LOW (ref 39.0–52.0)
Hemoglobin: 12.1 g/dL — ABNORMAL LOW (ref 13.0–17.0)
MCH: 28.9 pg (ref 26.0–34.0)
MCHC: 34.2 g/dL (ref 30.0–36.0)
MCV: 84.7 fL (ref 78.0–100.0)
Platelets: 155 10*3/uL (ref 150–400)
RBC: 4.18 MIL/uL — ABNORMAL LOW (ref 4.22–5.81)
RDW: 18.6 % — AB (ref 11.5–15.5)
WBC: 8.2 10*3/uL (ref 4.0–10.5)

## 2014-04-22 LAB — PROTIME-INR
INR: 3.1 — ABNORMAL HIGH (ref 0.00–1.49)
Prothrombin Time: 32.2 seconds — ABNORMAL HIGH (ref 11.6–15.2)

## 2014-04-22 MED ORDER — FUROSEMIDE 10 MG/ML IJ SOLN
40.0000 mg | Freq: Once | INTRAMUSCULAR | Status: AC
  Administered 2014-04-22: 40 mg via INTRAVENOUS
  Filled 2014-04-22: qty 4

## 2014-04-22 MED ORDER — FUROSEMIDE 10 MG/ML IJ SOLN
40.0000 mg | Freq: Once | INTRAMUSCULAR | Status: AC
  Administered 2014-04-22: 40 mg via INTRAVENOUS

## 2014-04-22 MED ORDER — METOPROLOL TARTRATE 1 MG/ML IV SOLN
INTRAVENOUS | Status: AC
  Filled 2014-04-22: qty 5

## 2014-04-22 MED ORDER — CETYLPYRIDINIUM CHLORIDE 0.05 % MT LIQD
7.0000 mL | Freq: Two times a day (BID) | OROMUCOSAL | Status: DC
  Administered 2014-04-22 – 2014-04-25 (×6): 7 mL via OROMUCOSAL

## 2014-04-22 MED ORDER — METOPROLOL TARTRATE 1 MG/ML IV SOLN
5.0000 mg | Freq: Once | INTRAVENOUS | Status: AC
  Administered 2014-04-22: 5 mg via INTRAVENOUS

## 2014-04-22 MED ORDER — FUROSEMIDE 40 MG PO TABS
40.0000 mg | ORAL_TABLET | Freq: Two times a day (BID) | ORAL | Status: DC
  Administered 2014-04-22 – 2014-04-25 (×6): 40 mg via ORAL
  Filled 2014-04-22 (×8): qty 1

## 2014-04-22 MED ORDER — WARFARIN 0.5 MG HALF TABLET
0.5000 mg | ORAL_TABLET | Freq: Once | ORAL | Status: AC
  Administered 2014-04-22: 0.5 mg via ORAL
  Filled 2014-04-22: qty 1

## 2014-04-22 NOTE — Progress Notes (Signed)
ANTICOAGULATION CONSULT NOTE - Follow Up Consult  Pharmacy Consult:  Coumadin Indication: hx DVT  Allergies  Allergen Reactions  . Iohexol Itching and Rash         Patient Measurements: Height: 5\' 9"  (175.3 cm) Weight: 178 lb 3.2 oz (80.831 kg) (bed weight d/t pt breathing) IBW/kg (Calculated) : 70.7  Vital Signs: Temp: 98.2 F (36.8 C) (11/30 0526) Temp Source: Oral (11/30 0526) BP: 101/75 mmHg (11/30 0526) Pulse Rate: 102 (11/30 0526)  Labs:  Recent Labs  04/20/14 1528 04/21/14 0250 04/22/14 0439  HGB 13.1 11.3* 12.1*  HCT 38.5* 33.6* 35.4*  PLT 153 135* 155  LABPROT 28.7* 31.2* 32.2*  INR 2.68* 2.98* 3.10*  CREATININE 1.78* 1.52* 1.50*    Estimated Creatinine Clearance: 32.1 mL/min (by C-G formula based on Cr of 1.5).     Assessment: 80 y/o male on chronic warfarin for history of DVT.  INR slightly supra-therapeutic today; no bleeding reported.   Goal of Therapy:  INR 2-3    Plan:  - Coumadin 0.5mg  PO today - Daily PT / INR - Vanc 1gm IV Q24H and Cefepime 1gm IV Q24H    Tillmon Kisling D. 06-30-1980, PharmD, BCPS Pager:  [phone] 04/22/2014, 10:27 AM

## 2014-04-22 NOTE — Plan of Care (Signed)
Problem: Phase I Progression Outcomes Goal: Dyspnea controlled at rest Outcome: Not Progressing Pt still short of breath even at rest.

## 2014-04-22 NOTE — Progress Notes (Signed)
Patient Demographics  [name] [name], is a 80 y.o. male, DOB - 28-Sep-1920, PVV:[id number]  Admit date - 04/20/2014   Admitting Physician Huey Bienenstock, MD  Outpatient Primary MD for the patient is [name], MD  LOS - 2   Chief Complaint  Patient presents with  . Shortness of Breath      Admission history of present illness/brief narrative: [name] [name] is a 80 y.o. male, with known past medical history of pulmonary hypertension,Donna kidney disease, chronic kidney disease, rheumatoid arthritis, gout, DVT on anticoagulation, started in August of [facility] secondary to acute CHF exacerbation, presents with complaints of shortness of breath, cough, productive sputum, his x-ray showing evidence of early pneumonia, patient was started on IV vancomycin and cefepime for age HCAP, patient did not have any leukocytosis, creatinine at baseline, had elevated BNP, but it is at his baseline as well on the chest x-ray does not show any evidence of acute CHF, INR was therapeutic at 2.68.  Subjective:   [name] today has, No headache, No chest pain, No abdominal pain - No Nausea, No new weakness tingling or numbness, still having productive cough, with mild blood-tinged sputum. Patient had laboratory response overnight secondary to shortness of breath, where he did his chest x-ray show pulmonary edema, patient has significant improvement after receiving 80 mg of IV Lasix.  Assessment & Plan    Principal Problem:   HCAP (healthcare-associated pneumonia) Active Problems:   HTN (hypertension)   CKD (chronic kidney disease), stage III   Rheumatoid arthritis   VTE (venous thromboembolism)   Zenker diverticulum   Debility   Chronic systolic heart failure  Healthcare associated pneumonia -Patient had hospitalization within the last 90 days, so continue IV  vancomycin and cefepime (11/28) - influenza is negative,  - blood cultures no growth to date -on nebulizer treatment, when necessary oxygen Mucinex, chest PT   Chronic systolic heart failure -Patient had an episode of pulmonary edema overnight, responded to IV Lasix, will resume patient back on Lasix, but will change from 80 mg oral daily to 40 mg oral twice a day, given his soft blood pressure and unpredictability with high-dose.  Hypertension -Blood pressure is on the lower side, so will continue hold antihypertensive medication until he is more stable  Rheumatoid arthritis -Continue with methotrexate and prednisone  VTE -On warfarin with therapeutic INR, pharmacy to dose warfarin  Chronic kidney disease -Appears to be at baseline, will monitor closely as patient is back on diuresis.    Code Status: Full  Family Communication: None at bedside   Disposition Plan: Remains on telemetry   Procedures  None   Consults   None   Medications  Scheduled Meds: . antiseptic oral rinse  7 mL Mouth Rinse BID  . calcium-vitamin D  1 tablet Oral Q breakfast  . ceFEPime (MAXIPIME) IV  1 g Intravenous Q24H  . dextromethorphan-guaiFENesin  1 tablet Oral BID  . furosemide  40 mg Oral BID  . ipratropium-albuterol  3 mL Nebulization Q6H  . methotrexate  7.5 mg Oral Q Mon  . metoprolol      . pneumococcal 23 valent vaccine  0.5 mL Intramuscular Tomorrow-1000  . predniSONE  10 mg Oral Q breakfast  . vancomycin  1,000 mg Intravenous Q24H  . warfarin  0.5 mg Oral ONCE-1800  . Warfarin - Pharmacist Dosing Inpatient   Does not apply q1800   Continuous Infusions:  PRN Meds:.acetaminophen **OR** acetaminophen, albuterol, alum & mag hydroxide-simeth, bisacodyl, ondansetron **OR** ondansetron (ZOFRAN) IV, oxyCODONE, senna-docusate  DVT Prophylaxis  on warfarin  Lab Results  Component Value Date   PLT 155 04/22/2014    Antibiotics   Anti-infectives    Start     Dose/Rate Route  Frequency Ordered Stop   04/20/14 1700  vancomycin (VANCOCIN) IVPB 1000 mg/200 mL premix     1,000 mg200 mL/hr over 60 Minutes Intravenous Every 24 hours 04/20/14 1637     04/20/14 1700  ceFEPIme (MAXIPIME) 1 g in dextrose 5 % 50 mL IVPB     1 g100 mL/hr over 30 Minutes Intravenous Every 24 hours 04/20/14 1637            Objective:   Filed Vitals:   04/22/14 0429 04/22/14 0526 04/22/14 1045 04/22/14 1124  BP: 111/90 101/75 110/55   Pulse: 105 102 99   Temp: 98.1 F (36.7 C) 98.2 F (36.8 C)    TempSrc: Oral Oral    Resp: 24 22 22    Height:      Weight:  80.831 kg (178 lb 3.2 oz)    SpO2: 100% 98% 99% 95%    Wt Readings from Last 3 Encounters:  04/22/14 80.831 kg (178 lb 3.2 oz)  04/15/14 81.647 kg (180 lb)  02/25/14 86.183 kg (190 lb)     Intake/Output Summary (Last 24 hours) at 04/22/14 1157 Last data filed at 04/22/14 0911  Gross per 24 hour  Intake   1321 ml  Output   1600 ml  Net   -279 ml     Physical Exam  Awake Alert, Oriented X 3, No new F.N deficits, Normal affect [place].AT,PERRAL Supple Neck,No JVD, No cervical lymphadenopathy appriciated.  Symmetrical Chest wall movement, Good air movement bilaterally, bibasilar crackles and rales. RRR,No Gallops,Rubs or new Murmurs, No Parasternal Heave +ve B.Sounds, Abd Soft, No tenderness, No organomegaly appriciated, No rebound - guarding or rigidity. No Cyanosis, Clubbing , No new Rash or bruise  , bilateral pedal edema  Data Review   Micro Results Recent Results (from the past 240 hour(s))  Culture, blood (routine x 2)     Status: None (Preliminary result)   Collection Time: 04/20/14  5:04 PM  Result Value Ref Range Status   Specimen Description BLOOD RIGHT ARM  Final   Special Requests BOTTLES DRAWN AEROBIC AND ANAEROBIC 5 CC EACH  Final   Culture  Setup Time   Final    04/21/2014 01:01 Performed at 04/23/2014    Culture   Final           BLOOD CULTURE RECEIVED NO GROWTH TO DATE CULTURE WILL  BE HELD FOR 5 DAYS BEFORE ISSUING A FINAL NEGATIVE REPORT Performed at [facility]    Report Status PENDING  Incomplete  Culture, blood (routine x 2)     Status: None (Preliminary result)   Collection Time: 04/20/14  5:12 PM  Result Value Ref Range Status   Specimen Description BLOOD RIGHT FOREARM  Final   Special Requests BOTTLES DRAWN AEROBIC ONLY 4 CC  Final   Culture  Setup Time   Final    04/21/2014 01:01 Performed at 04/23/2014    Culture   Final           BLOOD  CULTURE RECEIVED NO GROWTH TO DATE CULTURE WILL BE HELD FOR 5 DAYS BEFORE ISSUING A FINAL NEGATIVE REPORT Performed at [facility]    Report Status PENDING  Incomplete    Radiology Reports Dg Chest 2 View  04/20/2014   CLINICAL DATA:  Shortness of breath, cough  EXAM: CHEST  2 VIEW  COMPARISON:  01/12/2014  FINDINGS: Patchy left lower lobe opacity, suspicious for pneumonia.  Underlying chronic interstitial markings. No pleural effusion or pneumothorax.  The heart is normal size.  Degenerative changes of the visualized thoracolumbar spine.  IMPRESSION: Patchy left lower lobe opacity, suspicious for pneumonia.   Electronically Signed   By: Charline Bills M.D.   On: 04/20/2014 15:27   Dg Chest Port 1 View  04/22/2014   CLINICAL DATA:  Acute onset of shortness of breath. Subsequent study.  EXAM: PORTABLE CHEST - 1 VIEW  COMPARISON:  Chest radiograph performed 04/20/2014  FINDINGS: The lungs are well-aerated. Bibasilar airspace opacities are noted, with associated vascular congestion and small bilateral pleural effusions. This is concerning for pulmonary edema. No pneumothorax is seen.  The cardiomediastinal silhouette is borderline normal in size. No acute osseous abnormalities are seen. Chronic deformity is noted at the proximal left humerus.  IMPRESSION: Bibasilar airspace opacities, with associated vascular congestion and small bilateral pleural effusions, concerning for pulmonary edema.    Electronically Signed   By: Roanna Raider M.D.   On: 04/22/2014 03:28    CBC  Recent Labs Lab 04/20/14 1528 04/21/14 0250 04/22/14 0439  WBC 5.6 3.3* 8.2  HGB 13.1 11.3* 12.1*  HCT 38.5* 33.6* 35.4*  PLT 153 135* 155  MCV 86.1 84.8 84.7  MCH 29.3 28.5 28.9  MCHC 34.0 33.6 34.2  RDW 18.8* 18.4* 18.6*    Chemistries   Recent Labs Lab 04/20/14 1528 04/21/14 0250 04/22/14 0439  NA 141 142 136*  K 4.7 4.2 3.8  CL 102 103 98  CO2 24 23 20   GLUCOSE 112* 161* 131*  BUN 38* 37* 39*  CREATININE 1.78* 1.52* 1.50*  CALCIUM 9.0 8.4 8.6  MG  --  2.1  --    ------------------------------------------------------------------------------------------------------------------ estimated creatinine clearance is 32.1 mL/min (by C-G formula based on Cr of 1.5). ------------------------------------------------------------------------------------------------------------------ No results for input(s): HGBA1C in the last 72 hours. ------------------------------------------------------------------------------------------------------------------ No results for input(s): CHOL, HDL, LDLCALC, TRIG, CHOLHDL, LDLDIRECT in the last 72 hours. ------------------------------------------------------------------------------------------------------------------ No results for input(s): TSH, T4TOTAL, T3FREE, THYROIDAB in the last 72 hours.  Invalid input(s): FREET3 ------------------------------------------------------------------------------------------------------------------ No results for input(s): VITAMINB12, FOLATE, FERRITIN, TIBC, IRON, RETICCTPCT in the last 72 hours.  Coagulation profile  Recent Labs Lab 04/20/14 1528 04/21/14 0250 04/22/14 0439  INR 2.68* 2.98* 3.10*    No results for input(s): DDIMER in the last 72 hours.  Cardiac Enzymes No results for input(s): CKMB, TROPONINI, MYOGLOBIN in the last 168 hours.  Invalid input(s):  CK ------------------------------------------------------------------------------------------------------------------ Invalid input(s): POCBNP     Time Spent in minutes   30 minutes   Etheridge Geil M.D on 04/22/2014 at 11:57 AM  Between 7am to 7pm - Pager - [phone]  After 7pm go to [email or web address] - password [email or web address]  And look for the night coverage person covering for me after hours  [facility] Office  [phone]   **Disclaimer: This note may have been dictated with voice recognition software. Similar sounding words can inadvertently be transcribed and this note may contain transcription errors which may not have been corrected upon publication of note.**

## 2014-04-22 NOTE — Progress Notes (Signed)
Pt c/o shortness of breath, albuterol given and duoneb given by RT, pt still having expiratory wheezes. Dr. Jennye Boroughs notified and ordered lasix 40 mg IV once and stat chest xray.

## 2014-04-22 NOTE — Progress Notes (Signed)
1857 Placed back to bed . Rested thereafter

## 2014-04-22 NOTE — Plan of Care (Signed)
Problem: Phase II Progression Outcomes Goal: Encourage coughing & deep breathing Outcome: Progressing Goal: Wean O2 if indicated Outcome: Progressing Goal: Pain controlled Outcome: Completed/Met Date Met:  04/22/14 Goal: Review culture results with MD if needed Outcome: Completed/Met Date Met:  04/22/14 Goal: Progress activity as tolerated unless otherwise ordered Outcome: Progressing Goal: Discharge plan established Outcome: Progressing Goal: Tolerating diet Outcome: Completed/Met Date Met:  04/22/14

## 2014-04-22 NOTE — Plan of Care (Signed)
Problem: Phase I Progression Outcomes Goal: Dyspnea controlled at rest Outcome: Progressing Goal: Pain controlled with appropriate interventions Outcome: Not Applicable Date Met:  69/67/89 Goal: OOB as tolerated unless otherwise ordered Outcome: Completed/Met Date Met:  04/22/14 Goal: Confirm chest x-ray completed Outcome: Completed/Met Date Met:  04/22/14 Goal: Initial discharge plan identified Outcome: Progressing Goal: Voiding-avoid urinary catheter unless indicated Outcome: Completed/Met Date Met:  04/22/14

## 2014-04-22 NOTE — Progress Notes (Addendum)
Shift Event: RN paged this PA secondary to pt c/o's of SOB and dessating to 85% on RA. RT administered breathing txs and Placed on 2L [place] with sats of 95%, but with continued work of breathing.   -At  Bedside:  Pt awake, oriented, with inc work of breathing. Lungs with diminished air mvmt and   diffuse crackles. HR 140s, RR 40s.    Acute respiratory failure with hypoxia -Secondary to CHF exacerbation -CXR with vascular congestion/ effusions concerning for pulm edema.  -Ordered 40mg  IV Lasix X2 -EKG-unchanged from previous sinus tachycardia -Ordered IV lopressor 5mg .   -Within 20 mins pt is more relaxed, resting comfortably in bed and VSS.   -Placed condom cath- voided 125 cc  [facility] [phone]

## 2014-04-22 NOTE — Progress Notes (Signed)
Occupational Therapy Evaluation Patient Details Name: [name] [name] MRN: [id number] DOB: 09/04/1920 Today's Date: 04/22/2014    History of Present Illness [name] [name]  is a 80 y.o. male, with known past medical history of pulmonary hypertension,Donna kidney disease, chronic kidney disease, rheumatoid arthritis, gout, DVT on anticoagulation, started in August of [facility] secondary to acute CHF exacerbation, presents with complaints of shortness of breath, cough, productive sputum, his x-ray showing evidence of early pneumonia,   Clinical Impression   PTA pt mod I with ADL and mobility @ RW level. Pt was caregiver for his wife, who has advanced Alzheimer's. Pt completing ADL task on 4L with desat to 90. Dyspnea 2/4. Encouraged pt to sit in chair and to ambulate with staff. Feel pt will be able to D/C home with initial 24/7 S of family. Will follow acutely to address established goals.     Follow Up Recommendations  Home health OT;Supervision/Assistance - 24 hour (initially)    Equipment Recommendations  None recommended by OT    Recommendations for Other Services       Precautions / Restrictions Precautions Precautions: Fall;Other (comment) (watch O2 Sats)      Mobility Bed Mobility Overal bed mobility: Needs Assistance Bed Mobility: Supine to Sit     Supine to sit: Supervision        Transfers Overall transfer level: Needs assistance Equipment used: Rolling walker (2 wheeled) Transfers: Sit to/from [employer] Transfers Sit to Stand: Supervision Stand pivot transfers: Supervision       General transfer comment: has his own "method" of standing up, which is safe and functional    Balance             Standing balance-Leahy Scale: Good                              ADL Overall ADL's : Needs assistance/impaired     Grooming: Set up;Supervision/safety;Standing   Upper Body Bathing: Supervision/ safety;Set up;Sitting   Lower  Body Bathing: Minimal assistance;Sit to/from stand Lower Body Bathing Details (indicate cue type and reason): lmited by endurance Upper Body Dressing : Set up;Supervision/safety;Sitting   Lower Body Dressing: Minimal assistance;Sit to/from stand   Toilet Transfer: Supervision/safety;Ambulation     Toileting - Clothing Manipulation Details (indicate cue type and reason): using condom cath     Functional mobility during ADLs: Supervision/safety;Rolling walker General ADL Comments: poor endurance for ADL. AMbulating to sink on 4L. desat to 89. . May benefit from use of AE for LB ADL     Vision                     Perception     Praxis      Pertinent Vitals/Pain Pain Assessment: No/denies pain     Hand Dominance Right   Extremity/Trunk Assessment Upper Extremity Assessment Upper Extremity Assessment: Generalized weakness;LUE deficits/detail LUE Deficits / Details: rotator cuff deficits - limited shoulder flex to @ 30 degrees   Lower Extremity Assessment Lower Extremity Assessment: Defer to PT evaluation   Cervical / Trunk Assessment Cervical / Trunk Assessment: Normal   Communication Communication Communication: HOH   Cognition Arousal/Alertness: Awake/alert Behavior During Therapy: WFL for tasks assessed/performed Overall Cognitive Status: Within Functional Limits for tasks assessed                     General Comments       Exercises  Shoulder Instructions      Home Living Family/patient expects to be discharged to:: Private residence Living Arrangements: Spouse/significant other Available Help at Discharge: Family;Available PRN/intermittently Type of Home: House Home Access: Stairs to enter [employer] of Steps: 3 Entrance Stairs-Rails: None Home Layout: One level     Bathroom Shower/Tub: Tub/shower unit Shower/tub characteristics: Door Firefighter: Standard Bathroom Accessibility: Yes How Accessible: Accessible  via walker Home Equipment:  (handicap stoll over toilet)   Additional Comments: Pt has daughter and son that lives with him and wife. They both work and are available intermitterntly. Pt is primary caregiver for wife who has advanced Alzheimer's.  (Family is talking care of their Mom)      Prior Functioning/Environment Level of Independence: Independent with assistive device(s)        Comments: Has started using RW.  Not driving at this time.  Has been primary caregiver to his wife.  Son and Daughter assisting    OT Diagnosis: Generalized weakness   OT Problem List: Decreased strength;Decreased activity tolerance;Cardiopulmonary status limiting activity;Decreased knowledge of use of DME or AE   OT Treatment/Interventions: Self-care/ADL training;Therapeutic exercise;DME and/or AE instruction;Therapeutic activities;Patient/family education    OT Goals(Current goals can be found in the care plan section) Acute Rehab OT Goals Patient Stated Goal: Able to help take care of my wife OT Goal Formulation: With patient Time For Goal Achievement: 05/06/14 Potential to Achieve Goals: Good  OT Frequency: Min 2X/week   Barriers to D/C:            Co-evaluation              End of Session Equipment Utilized During Treatment: Gait belt;Rolling walker;Oxygen (4L) Nurse Communication: Mobility status  Activity Tolerance: Patient tolerated treatment well Patient left: in chair;with call bell/phone within reach   Time: 5681-2751 OT Time Calculation (min): 32 min Charges:  OT General Charges $OT Visit: 1 Procedure OT Evaluation $Initial OT Evaluation Tier I: 1 Procedure OT Treatments $Self Care/Home Management : 23-37 mins G-Codes:    Anglia Blakley,HILLARY 2014/05/04, 11:34 AM   Luisa Dago, OTR/L  [phone] May 04, 2014

## 2014-04-22 NOTE — Progress Notes (Signed)
Called to room 3e 28 for pt in respiratory distress. Pt originally admitted pna and chf exacerbation. Per floor RN pt has had SOB since midnight. He has received several NEB treatments and 40 mg IV lasix. CXR completed reveals pulmonary edema. Upon my arrival pt rr 30-40, hr 140s st. Audible rales, exp wheezes and crackles heard upon auscultation. Po2 94-95 on 2 LNC. PT awake oriented and anxious. S.Osman PA called to bedside to evaluate Pt. Bedside EKG completed revealing ST. An additional 40mg  lasix ordered and IV lopressor. Within 20 minutes pt more relaxed. Condom cath placed. Pt left resting in bed RR 20s, HR 98-105 ST, calm resting with eyes closed. RN advised to contact myself and/or provider for worsening changes.

## 2014-04-22 NOTE — Progress Notes (Signed)
Pt c/o shortness of breath, still have wheezes on auscultation, O2 sat= 97 on room air, prn albuterol neb given. Pt feels better after treatment. Will continue to monitor.

## 2014-04-23 LAB — CBC
HEMATOCRIT: 33.4 % — AB (ref 39.0–52.0)
Hemoglobin: 11.6 g/dL — ABNORMAL LOW (ref 13.0–17.0)
MCH: 29.3 pg (ref 26.0–34.0)
MCHC: 34.7 g/dL (ref 30.0–36.0)
MCV: 84.3 fL (ref 78.0–100.0)
Platelets: 136 10*3/uL — ABNORMAL LOW (ref 150–400)
RBC: 3.96 MIL/uL — AB (ref 4.22–5.81)
RDW: 18.4 % — ABNORMAL HIGH (ref 11.5–15.5)
WBC: 6.3 10*3/uL (ref 4.0–10.5)

## 2014-04-23 LAB — BASIC METABOLIC PANEL
Anion gap: 20 — ABNORMAL HIGH (ref 5–15)
BUN: 42 mg/dL — ABNORMAL HIGH (ref 6–23)
CALCIUM: 8.2 mg/dL — AB (ref 8.4–10.5)
CO2: 20 mEq/L (ref 19–32)
Chloride: 97 mEq/L (ref 96–112)
Creatinine, Ser: 1.43 mg/dL — ABNORMAL HIGH (ref 0.50–1.35)
GFR calc Af Amer: 48 mL/min — ABNORMAL LOW (ref >90)
GFR calc non Af Amer: 41 mL/min — ABNORMAL LOW (ref >90)
Glucose, Bld: 91 mg/dL (ref 70–99)
Potassium: 3.4 mEq/L — ABNORMAL LOW (ref 3.7–5.3)
Sodium: 137 mEq/L (ref 137–147)

## 2014-04-23 LAB — PROTIME-INR
INR: 3.14 — ABNORMAL HIGH (ref 0.00–1.49)
Prothrombin Time: 32.5 seconds — ABNORMAL HIGH (ref 11.6–15.2)

## 2014-04-23 LAB — GLUCOSE, CAPILLARY: GLUCOSE-CAPILLARY: 120 mg/dL — AB (ref 70–99)

## 2014-04-23 MED ORDER — POTASSIUM CHLORIDE CRYS ER 20 MEQ PO TBCR
40.0000 meq | EXTENDED_RELEASE_TABLET | Freq: Once | ORAL | Status: AC
  Administered 2014-04-23: 40 meq via ORAL

## 2014-04-23 NOTE — Plan of Care (Signed)
Problem: Phase II Progression Outcomes Goal: Wean O2 if indicated Outcome: Completed/Met Date Met:  04/23/14  Problem: Phase III Progression Outcomes Goal: O2 sats > or equal to 93% on room air Outcome: Progressing Goal: Pain controlled on oral analgesia Outcome: Adequate for Discharge Goal: Activity at appropriate level-compared to baseline (UP IN CHAIR FOR HEMODIALYSIS)  Outcome: Progressing Goal: Tolerating diet Outcome: Adequate for Discharge Goal: Convert IV antibiotics to PO Outcome: Progressing

## 2014-04-23 NOTE — Progress Notes (Addendum)
ANTICOAGULATION / ANTIBIOTIC CONSULT NOTE - Follow Up Consult  Pharmacy Consult:  Coumadin + Vancomycin / Cefepime Indication: hx DVT + HCAP  Allergies  Allergen Reactions  . Iohexol Itching and Rash         Patient Measurements: Height: 5\' 9"  (175.3 cm) Weight: 176 lb 6.4 oz (80.015 kg) (scale B) IBW/kg (Calculated) : 70.7  Vital Signs: Temp: 97.4 F (36.3 C) (12/01 0604) Temp Source: Oral (12/01 0604) BP: 109/89 mmHg (12/01 0604) Pulse Rate: 103 (12/01 0604)  Labs:  Recent Labs  04/21/14 0250 04/22/14 0439 04/23/14 0258  HGB 11.3* 12.1* 11.6*  HCT 33.6* 35.4* 33.4*  PLT 135* 155 136*  LABPROT 31.2* 32.2* 32.5*  INR 2.98* 3.10* 3.14*  CREATININE 1.52* 1.50* 1.43*    Estimated Creatinine Clearance: 33.6 mL/min (by C-G formula based on Cr of 1.43).     Assessment: 80 y/o male on chronic warfarin for history of DVT.  INR slightly supra-therapeutic with very low Coumadin doses; no bleeding reported.   He continues on day #4 of vancomycin and cefepime for HCAP.  Patient's renal function is gradually improving.  Good urine output.  WBC WNL and he is afebrile.  Vanc 11/28 >> Cefepime 11/28 >>   11/28 BCx2 - NGTD 11/28 Flu -   Goal of Therapy:  INR 2-3  Vanc trough: 15-20 mcg/mL    Plan:  - No Coumadin today - Daily PT / INR - Continue Vanc 1gm IV Q24H and Cefepime 1gm IV Q24H - Monitor renal fxn, clinical progress, vanc trough if abx not changed to PO soon   Jalee Saine D. 06-30-1980, PharmD, BCPS Pager:  [phone] 04/23/2014, 10:19 AM

## 2014-04-23 NOTE — Progress Notes (Signed)
1700 back to bed .Keep comfortable in bed.

## 2014-04-23 NOTE — Plan of Care (Signed)
Problem: Phase I Progression Outcomes Goal: Dyspnea controlled at rest Outcome: Progressing Goal: First antibiotic given within 6hrs of admit Outcome: Completed/Met Date Met:  04/23/14 Goal: Initial discharge plan identified Outcome: Progressing Goal: Hemodynamically stable Outcome: Completed/Met Date Met:  04/23/14  Problem: Phase II Progression Outcomes Goal: Encourage coughing & deep breathing Outcome: Progressing Goal: Wean O2 if indicated Outcome: Progressing

## 2014-04-23 NOTE — Progress Notes (Signed)
Physical Therapy Treatment Patient Details Name: [name] [name] MRN: [id number] DOB: Mar 01, 1921 Today's Date: 05-10-14    History of Present Illness [name] [name]  is a 80 y.o. male, with known past medical history of pulmonary hypertension,Donna kidney disease, chronic kidney disease, rheumatoid arthritis, gout, DVT on anticoagulation, started in August of [facility] secondary to acute CHF exacerbation, presents with complaints of shortness of breath, cough, productive sputum, his x-ray showing evidence of early pneumonia,    PT Comments    Pt progressing well. SaO2 92-94% on RA with amb.  Follow Up Recommendations  Home health PT;Supervision - Intermittent     Equipment Recommendations  None recommended by PT    Recommendations for Other Services       Precautions / Restrictions Precautions Precautions: Fall    Mobility  Bed Mobility                  Transfers Overall transfer level: Modified independent Equipment used: Rolling walker (2 wheeled) Transfers: Sit to/from Stand Sit to Stand: Modified independent (Device/Increase time)            Ambulation/Gait Ambulation/Gait assistance: Supervision Ambulation Distance (Feet): 220 Feet Assistive device: Rolling walker (2 wheeled) Gait Pattern/deviations: Step-through pattern;Decreased stride length     General Gait Details: Pt with SaO2 92-94% on RA with amb   Stairs            Wheelchair Mobility    Modified Rankin (Stroke Patients Only)       Balance Overall balance assessment: Needs assistance Sitting-balance support: No upper extremity supported Sitting balance-Leahy Scale: Normal     Standing balance support: No upper extremity supported Standing balance-Leahy Scale: Good                      Cognition Arousal/Alertness: Awake/alert Behavior During Therapy: WFL for tasks assessed/performed Overall Cognitive Status: Within Functional Limits for tasks  assessed                      Exercises      General Comments        Pertinent Vitals/Pain Pain Assessment: No/denies pain    Home Living                      Prior Function            PT Goals (current goals can now be found in the care plan section) Progress towards PT goals: Progressing toward goals    Frequency  Min 3X/week    PT Plan Current plan remains appropriate    Co-evaluation             End of Session   Activity Tolerance: Patient tolerated treatment well Patient left: in chair;with call bell/phone within reach     Time: 1210-1233 PT Time Calculation (min) (ACUTE ONLY): 23 min  Charges:  $Gait Training: 23-37 mins                    G Codes:      [name] [name] 10-May-2014, 2:10 PM  [employer] PT [phone]

## 2014-04-23 NOTE — Progress Notes (Signed)
Patient Demographics  [name] [name], is a 80 y.o. male, DOB - [birth year], [id number]  Admit date - 04/20/2014   Admitting Physician Huey Bienenstock, MD  Outpatient Primary MD for the patient is [name], MD  LOS - 3   Chief Complaint  Patient presents with  . Shortness of Breath      Admission history of present illness/brief narrative: [name] [name] is a 80 y.o. male, with known past medical history of pulmonary hypertension,Donna kidney disease, chronic kidney disease, rheumatoid arthritis, gout, DVT on anticoagulation, started in August of [facility] secondary to acute CHF exacerbation, presents with complaints of shortness of breath, cough, productive sputum, his x-ray showing evidence of early pneumonia, patient was started on IV vancomycin and cefepime for age HCAP, patient did not have any leukocytosis, creatinine at baseline, had elevated BNP, but it is at his baseline as well on the chest x-ray does not show any evidence of acute CHF, INR was therapeutic at 2.68.  Subjective:   [name] [name] today has, No headache, No chest pain, No abdominal pain - No Nausea, No new weakness tingling or numbness, productive cough much improved.   Assessment & Plan    Principal Problem:   HCAP (healthcare-associated pneumonia) Active Problems:   HTN (hypertension)   CKD (chronic kidney disease), stage III   Rheumatoid arthritis   VTE (venous thromboembolism)   Zenker diverticulum   Debility   Chronic systolic heart failure  Healthcare associated pneumonia - continue IV vancomycin and cefepime (11/28) - influenza is negative,  - blood cultures no growth to date -on nebulizer treatment, when necessary oxygen Mucinex, chest PT   Chronic systolic heart failure -Patient had an episode of pulmonary edema overnight 11/30, responded to IV Lasix,  will resume patient back on Lasix, but will change from 80 mg oral daily to 40 mg oral twice a day, given his soft blood pressure and unpredictability with high-dose.  Hypertension -Blood pressure is on the lower side, so will continue hold antihypertensive medication until he is more stable  Rheumatoid arthritis -Continue with methotrexate and prednisone  VTE -On warfarin with therapeutic INR, pharmacy to dose warfarin  Chronic kidney disease -Appears to be at baseline, will monitor closely as patient is back on diuresis.    Code Status: Full  Family Communication: None at bedside   Disposition Plan: Remains on telemetry   Procedures  None   Consults   None   Medications  Scheduled Meds: . antiseptic oral rinse  7 mL Mouth Rinse BID  . calcium-vitamin D  1 tablet Oral Q breakfast  . ceFEPime (MAXIPIME) IV  1 g Intravenous Q24H  . dextromethorphan-guaiFENesin  1 tablet Oral BID  . furosemide  40 mg Oral BID  . methotrexate  7.5 mg Oral Q Mon  . predniSONE  10 mg Oral Q breakfast  . vancomycin  1,000 mg Intravenous Q24H  . Warfarin - Pharmacist Dosing Inpatient   Does not apply q1800   Continuous Infusions:  PRN Meds:.acetaminophen **OR** acetaminophen, albuterol, alum & mag hydroxide-simeth, bisacodyl, ondansetron **OR** ondansetron (ZOFRAN) IV, oxyCODONE, senna-docusate  DVT Prophylaxis  on warfarin  Lab Results  Component Value Date   PLT 136* 04/23/2014    Antibiotics   Anti-infectives  Start     Dose/Rate Route Frequency Ordered Stop   04/20/14 1700  vancomycin (VANCOCIN) IVPB 1000 mg/200 mL premix     1,000 mg200 mL/hr over 60 Minutes Intravenous Every 24 hours 04/20/14 1637     04/20/14 1700  ceFEPIme (MAXIPIME) 1 g in dextrose 5 % 50 mL IVPB     1 g100 mL/hr over 30 Minutes Intravenous Every 24 hours 04/20/14 1637            Objective:   Filed Vitals:   04/23/14 0853 04/23/14 1018 04/23/14 1228 04/23/14 1517  BP:  109/69  91/73  Pulse:   102  96  Temp:    98.2 F (36.8 C)  TempSrc:    Oral  Resp:  18  18  Height:      Weight:      SpO2: 95% 96% 92% 96%    Wt Readings from Last 3 Encounters:  04/23/14 80.015 kg (176 lb 6.4 oz)  04/15/14 81.647 kg (180 lb)  02/25/14 86.183 kg (190 lb)     Intake/Output Summary (Last 24 hours) at 04/23/14 1718 Last data filed at 04/23/14 1310  Gross per 24 hour  Intake    800 ml  Output   1451 ml  Net   -651 ml     Physical Exam  Awake Alert, Oriented X 3, No new F.N deficits, Normal affect [place].AT,PERRAL Supple Neck,No JVD, No cervical lymphadenopathy appriciated.  Symmetrical Chest wall movement, Good air movement bilaterally, bibasilar crackles and rales. RRR,No Gallops,Rubs or new Murmurs, No Parasternal Heave +ve B.Sounds, Abd Soft, No tenderness, No organomegaly appriciated, No rebound - guarding or rigidity. No Cyanosis, Clubbing , No new Rash or bruise  , bilateral pedal edema  Data Review   Micro Results Recent Results (from the past 240 hour(s))  Culture, blood (routine x 2)     Status: None (Preliminary result)   Collection Time: 04/20/14  5:04 PM  Result Value Ref Range Status   Specimen Description BLOOD RIGHT ARM  Final   Special Requests BOTTLES DRAWN AEROBIC AND ANAEROBIC 5 CC EACH  Final   Culture  Setup Time   Final    04/21/2014 01:01 Performed at [facility]    Culture   Final           BLOOD CULTURE RECEIVED NO GROWTH TO DATE CULTURE WILL BE HELD FOR 5 DAYS BEFORE ISSUING A FINAL NEGATIVE REPORT Performed at [facility]    Report Status PENDING  Incomplete  Culture, blood (routine x 2)     Status: None (Preliminary result)   Collection Time: 04/20/14  5:12 PM  Result Value Ref Range Status   Specimen Description BLOOD RIGHT FOREARM  Final   Special Requests BOTTLES DRAWN AEROBIC ONLY 4 CC  Final   Culture  Setup Time   Final    04/21/2014 01:01 Performed at [facility]    Culture   Final           BLOOD  CULTURE RECEIVED NO GROWTH TO DATE CULTURE WILL BE HELD FOR 5 DAYS BEFORE ISSUING A FINAL NEGATIVE REPORT Performed at [facility]    Report Status PENDING  Incomplete    Radiology Reports Dg Chest Port 1 View  04/22/2014   CLINICAL DATA:  Acute onset of shortness of breath. Subsequent study.  EXAM: PORTABLE CHEST - 1 VIEW  COMPARISON:  Chest radiograph performed 04/20/2014  FINDINGS: The lungs are well-aerated. Bibasilar airspace opacities are noted,  with associated vascular congestion and small bilateral pleural effusions. This is concerning for pulmonary edema. No pneumothorax is seen.  The cardiomediastinal silhouette is borderline normal in size. No acute osseous abnormalities are seen. Chronic deformity is noted at the proximal left humerus.  IMPRESSION: Bibasilar airspace opacities, with associated vascular congestion and small bilateral pleural effusions, concerning for pulmonary edema.   Electronically Signed   By: Roanna Raider M.D.   On: 04/22/2014 03:28    CBC  Recent Labs Lab 04/20/14 1528 04/21/14 0250 04/22/14 0439 04/23/14 0258  WBC 5.6 3.3* 8.2 6.3  HGB 13.1 11.3* 12.1* 11.6*  HCT 38.5* 33.6* 35.4* 33.4*  PLT 153 135* 155 136*  MCV 86.1 84.8 84.7 84.3  MCH 29.3 28.5 28.9 29.3  MCHC 34.0 33.6 34.2 34.7  RDW 18.8* 18.4* 18.6* 18.4*    Chemistries   Recent Labs Lab 04/20/14 1528 04/21/14 0250 04/22/14 0439 04/23/14 0258  NA 141 142 136* 137  K 4.7 4.2 3.8 3.4*  CL 102 103 98 97  CO2 24 23 20 20   GLUCOSE 112* 161* 131* 91  BUN 38* 37* 39* 42*  CREATININE 1.78* 1.52* 1.50* 1.43*  CALCIUM 9.0 8.4 8.6 8.2*  MG  --  2.1  --   --    ------------------------------------------------------------------------------------------------------------------ estimated creatinine clearance is 33.6 mL/min (by C-G formula based on Cr of 1.43). ------------------------------------------------------------------------------------------------------------------ No  results for input(s): HGBA1C in the last 72 hours. ------------------------------------------------------------------------------------------------------------------ No results for input(s): CHOL, HDL, LDLCALC, TRIG, CHOLHDL, LDLDIRECT in the last 72 hours. ------------------------------------------------------------------------------------------------------------------ No results for input(s): TSH, T4TOTAL, T3FREE, THYROIDAB in the last 72 hours.  Invalid input(s): FREET3 ------------------------------------------------------------------------------------------------------------------ No results for input(s): VITAMINB12, FOLATE, FERRITIN, TIBC, IRON, RETICCTPCT in the last 72 hours.  Coagulation profile  Recent Labs Lab 04/20/14 1528 04/21/14 0250 04/22/14 0439 04/23/14 0258  INR 2.68* 2.98* 3.10* 3.14*    No results for input(s): DDIMER in the last 72 hours.  Cardiac Enzymes No results for input(s): CKMB, TROPONINI, MYOGLOBIN in the last 168 hours.  Invalid input(s): CK ------------------------------------------------------------------------------------------------------------------ Invalid input(s): POCBNP     Time Spent in minutes   25 minutes   Tasmine Hipwell M.D on 04/23/2014 at 5:18 PM  Between 7am to 7pm - Pager - [phone]  After 7pm go to [email or web address] - password [email or web address]  And look for the night coverage person covering for me after hours  [facility] Office  [phone]   **Disclaimer: This note may have been dictated with voice recognition software. Similar sounding words can inadvertently be transcribed and this note may contain transcription errors which may not have been corrected upon publication of note.**

## 2014-04-24 DIAGNOSIS — R5381 Other malaise: Secondary | ICD-10-CM

## 2014-04-24 DIAGNOSIS — I5022 Chronic systolic (congestive) heart failure: Secondary | ICD-10-CM

## 2014-04-24 DIAGNOSIS — N183 Chronic kidney disease, stage 3 (moderate): Secondary | ICD-10-CM

## 2014-04-24 DIAGNOSIS — I10 Essential (primary) hypertension: Secondary | ICD-10-CM

## 2014-04-24 LAB — CBC
HCT: 36.6 % — ABNORMAL LOW (ref 39.0–52.0)
HEMOGLOBIN: 12.7 g/dL — AB (ref 13.0–17.0)
MCH: 29.4 pg (ref 26.0–34.0)
MCHC: 34.7 g/dL (ref 30.0–36.0)
MCV: 84.7 fL (ref 78.0–100.0)
PLATELETS: 168 10*3/uL (ref 150–400)
RBC: 4.32 MIL/uL (ref 4.22–5.81)
RDW: 18.4 % — ABNORMAL HIGH (ref 11.5–15.5)
WBC: 5.6 10*3/uL (ref 4.0–10.5)

## 2014-04-24 LAB — BASIC METABOLIC PANEL
ANION GAP: 18 — AB (ref 5–15)
BUN: 38 mg/dL — ABNORMAL HIGH (ref 6–23)
CO2: 21 mEq/L (ref 19–32)
Calcium: 8.2 mg/dL — ABNORMAL LOW (ref 8.4–10.5)
Chloride: 100 mEq/L (ref 96–112)
Creatinine, Ser: 1.4 mg/dL — ABNORMAL HIGH (ref 0.50–1.35)
GFR calc Af Amer: 49 mL/min — ABNORMAL LOW (ref >90)
GFR, EST NON AFRICAN AMERICAN: 42 mL/min — AB (ref >90)
Glucose, Bld: 93 mg/dL (ref 70–99)
Potassium: 3.3 mEq/L — ABNORMAL LOW (ref 3.7–5.3)
SODIUM: 139 meq/L (ref 137–147)

## 2014-04-24 LAB — PROTIME-INR
INR: 2.53 — ABNORMAL HIGH (ref 0.00–1.49)
PROTHROMBIN TIME: 27.4 s — AB (ref 11.6–15.2)

## 2014-04-24 MED ORDER — WARFARIN SODIUM 1 MG PO TABS
1.5000 mg | ORAL_TABLET | Freq: Once | ORAL | Status: AC
  Administered 2014-04-24: 1.5 mg via ORAL
  Filled 2014-04-24: qty 1

## 2014-04-24 MED ORDER — POTASSIUM CHLORIDE CRYS ER 20 MEQ PO TBCR
40.0000 meq | EXTENDED_RELEASE_TABLET | Freq: Every day | ORAL | Status: DC
  Administered 2014-04-24 – 2014-04-25 (×2): 40 meq via ORAL
  Filled 2014-04-24 (×2): qty 2

## 2014-04-24 NOTE — Progress Notes (Signed)
Patient ID: [name] [name]  male  NWG:[id number]    DOB: 1921/03/29    DOA: 04/20/2014  PCP: [name], MD  Brief narrative  Patient is a 80 year old male with pulmonary hypertension, CKD, rheumatoid arthritis, gout, DVT on anticoagulation, presented with shortness of breath, productive cough. Chest x-ray showed evidence of early pneumonia. Patient was placed on IV vancomycin and cefepime for HCAP. (Patient had been admitted in August for acute CHF exacerbation)  Assessment/Plan: Principal Problem:   HCAP (healthcare-associated pneumonia) - Continue IV vancomycin and cefepime, started on 11/28 - Influenza negative, blood cultures negative so far - Continue O2, antitussives, prn nebs  Active Problems:   HTN (hypertension) - Currently stable    CKD (chronic kidney disease), stage III - Appears at baseline    Rheumatoid arthritis -Continue methotrexate and prednisone     VTE (venous thromboembolism) - Continue Coumadin per pharmacy    Chronic systolic heart failure - Patient had an episode of pulmonary edema on 11/30, responded to IV Lasix. Continue Lasix 40 mg twice a day  DVT Prophylaxis: warfarin   Code Status: full code   Family Communication:  Disposition: PT eval pending   Consultants: None Procedures:  None  Antibiotics:  IV vanc and cefepime     Subjective: Patient seen and examined, still feels weak and not ready for disposition. Denies any chest pain, still feels he has coughing with "dark tinge" to it, no fevers or chills   Objective: Weight change: -0.59 kg (-1 lb 4.8 oz)  Intake/Output Summary (Last 24 hours) at 04/24/14 1225 Last data filed at 04/24/14 0856  Gross per 24 hour  Intake   1330 ml  Output   2176 ml  Net   -846 ml   Blood pressure 109/71, pulse 72, temperature 98.2 F (36.8 C), temperature source Oral, resp. rate 18, height 5\' 9"  (1.753 m), weight 79.425 kg (175 lb 1.6 oz), SpO2 97 %.  Physical Exam: General: Alert  and awake, oriented x3, not in any acute distress. CVS: S1-S2 clear, no murmur rubs or gallops Chest:Decreased breath sounds at the bases Abdomen: Soft, NT, ND Extremities : no cyanosis, clubbing, 1+ edema noted bilaterally Neuro: Cranial nerves II-XII intact, no focal neurological deficits  Lab Results: Basic Metabolic Panel:  Recent Labs Lab 04/21/14 0250  04/23/14 0258 04/24/14 0610  NA 142  < > 137 139  K 4.2  < > 3.4* 3.3*  CL 103  < > 97 100  CO2 23  < > 20 21  GLUCOSE 161*  < > 91 93  BUN 37*  < > 42* 38*  CREATININE 1.52*  < > 1.43* 1.40*  CALCIUM 8.4  < > 8.2* 8.2*  MG 2.1  --   --   --   < > = values in this interval not displayed. Liver Function Tests: No results for input(s): AST, ALT, ALKPHOS, BILITOT, PROT, ALBUMIN in the last 168 hours. No results for input(s): LIPASE, AMYLASE in the last 168 hours. No results for input(s): AMMONIA in the last 168 hours. CBC:  Recent Labs Lab 04/23/14 0258 04/24/14 0610  WBC 6.3 5.6  HGB 11.6* 12.7*  HCT 33.4* 36.6*  MCV 84.3 84.7  PLT 136* 168   Cardiac Enzymes: No results for input(s): CKTOTAL, CKMB, CKMBINDEX, TROPONINI in the last 168 hours. BNP: Invalid input(s): POCBNP CBG:  Recent Labs Lab 04/21/14 1106 04/21/14 1656 04/22/14 2142  GLUCAP 184* 148* 120*     Micro Results: Recent Results (from  the past 240 hour(s))  Culture, blood (routine x 2)     Status: None (Preliminary result)   Collection Time: 04/20/14  5:04 PM  Result Value Ref Range Status   Specimen Description BLOOD RIGHT ARM  Final   Special Requests BOTTLES DRAWN AEROBIC AND ANAEROBIC 5 CC EACH  Final   Culture  Setup Time   Final    04/21/2014 01:01 Performed at [facility]    Culture   Final           BLOOD CULTURE RECEIVED NO GROWTH TO DATE CULTURE WILL BE HELD FOR 5 DAYS BEFORE ISSUING A FINAL NEGATIVE REPORT Performed at [facility]    Report Status PENDING  Incomplete  Culture, blood (routine x 2)      Status: None (Preliminary result)   Collection Time: 04/20/14  5:12 PM  Result Value Ref Range Status   Specimen Description BLOOD RIGHT FOREARM  Final   Special Requests BOTTLES DRAWN AEROBIC ONLY 4 CC  Final   Culture  Setup Time   Final    04/21/2014 01:01 Performed at [facility]    Culture   Final           BLOOD CULTURE RECEIVED NO GROWTH TO DATE CULTURE WILL BE HELD FOR 5 DAYS BEFORE ISSUING A FINAL NEGATIVE REPORT Performed at [facility]    Report Status PENDING  Incomplete    Studies/Results: Dg Chest 2 View  04/20/2014   CLINICAL DATA:  Shortness of breath, cough  EXAM: CHEST  2 VIEW  COMPARISON:  01/12/2014  FINDINGS: Patchy left lower lobe opacity, suspicious for pneumonia.  Underlying chronic interstitial markings. No pleural effusion or pneumothorax.  The heart is normal size.  Degenerative changes of the visualized thoracolumbar spine.  IMPRESSION: Patchy left lower lobe opacity, suspicious for pneumonia.   Electronically Signed   By: Charline Bills M.D.   On: 04/20/2014 15:27   Dg Chest Port 1 View  04/22/2014   CLINICAL DATA:  Acute onset of shortness of breath. Subsequent study.  EXAM: PORTABLE CHEST - 1 VIEW  COMPARISON:  Chest radiograph performed 04/20/2014  FINDINGS: The lungs are well-aerated. Bibasilar airspace opacities are noted, with associated vascular congestion and small bilateral pleural effusions. This is concerning for pulmonary edema. No pneumothorax is seen.  The cardiomediastinal silhouette is borderline normal in size. No acute osseous abnormalities are seen. Chronic deformity is noted at the proximal left humerus.  IMPRESSION: Bibasilar airspace opacities, with associated vascular congestion and small bilateral pleural effusions, concerning for pulmonary edema.   Electronically Signed   By: Roanna Raider M.D.   On: 04/22/2014 03:28    Medications: Scheduled Meds: . antiseptic oral rinse  7 mL Mouth Rinse BID  . calcium-vitamin  D  1 tablet Oral Q breakfast  . ceFEPime (MAXIPIME) IV  1 g Intravenous Q24H  . dextromethorphan-guaiFENesin  1 tablet Oral BID  . furosemide  40 mg Oral BID  . methotrexate  7.5 mg Oral Q Mon  . potassium chloride  40 mEq Oral Daily  . predniSONE  10 mg Oral Q breakfast  . vancomycin  1,000 mg Intravenous Q24H  . warfarin  1.5 mg Oral ONCE-1800  . Warfarin - Pharmacist Dosing Inpatient   Does not apply q1800      LOS: 4 days   RAI,RIPUDEEP M.D. [facility] 04/24/2014, 12:25 PM Pager: [phone]  If 7PM-7AM, please contact night-coverage [email or web address] Password [email or web address]

## 2014-04-24 NOTE — Plan of Care (Signed)
Problem: Phase II Progression Outcomes Goal: Encourage coughing & deep breathing Outcome: Progressing     

## 2014-04-24 NOTE — Plan of Care (Signed)
Problem: Phase I Progression Outcomes Goal: Dyspnea controlled at rest Outcome: Progressing Patient continues to voice shortness of breath intermittently.  O2 sats on room air are 94-98%.  Placed patient on 1L via nasal cannula for comfort while asleep.  Will continue to monitor patient condition.

## 2014-04-24 NOTE — Progress Notes (Signed)
ANTICOAGULATION CONSULT NOTE - Follow Up Consult  Pharmacy Consult:  Coumadin Indication: hx DVT  Allergies  Allergen Reactions  . Iohexol Itching and Rash         Patient Measurements: Height: 5\' 9"  (175.3 cm) Weight: 175 lb 1.6 oz (79.425 kg) (scale b) IBW/kg (Calculated) : 70.7  Vital Signs: Temp: 98.2 F (36.8 C) (12/02 0637) Temp Source: Oral (12/02 10-11-1992) BP: 114/72 mmHg (12/02 10-11-1992) Pulse Rate: 108 (12/02 0637)  Labs:  Recent Labs  04/22/14 0439 04/23/14 0258 04/24/14 0610  HGB 12.1* 11.6* 12.7*  HCT 35.4* 33.4* 36.6*  PLT 155 136* 168  LABPROT 32.2* 32.5* 27.4*  INR 3.10* 3.14* 2.53*  CREATININE 1.50* 1.43* 1.40*    Estimated Creatinine Clearance: 34.4 mL/min (by C-G formula based on Cr of 1.4).     Assessment: 80 y/o male on chronic warfarin for history of DVT.  INR decreased to therapeutic level today; no bleeding reported.  Patient has been requiring lower Coumadin doses than home dose, which is 2.5mg  PO daily except 5mg  on Wed.   Goal of Therapy:  INR 2-3  Vanc trough: 15-20 mcg/mL    Plan:  - Coumadin 1.5mg  PO today - Daily PT / INR - Continue Vanc 1gm IV Q24H and Cefepime 1gm IV Q24H - Monitor renal fxn, clinical progress, vanc trough if abx not changed to PO soon   Rosario Duey D. Tue, PharmD, BCPS Pager:  [phone] 04/24/2014, 9:31 AM

## 2014-04-25 DIAGNOSIS — I8290 Acute embolism and thrombosis of unspecified vein: Secondary | ICD-10-CM

## 2014-04-25 DIAGNOSIS — M069 Rheumatoid arthritis, unspecified: Secondary | ICD-10-CM

## 2014-04-25 DIAGNOSIS — K225 Diverticulum of esophagus, acquired: Secondary | ICD-10-CM

## 2014-04-25 LAB — PROTIME-INR
INR: 2.1 — AB (ref 0.00–1.49)
PROTHROMBIN TIME: 23.8 s — AB (ref 11.6–15.2)

## 2014-04-25 MED ORDER — PREDNISONE 10 MG PO TABS: ORAL_TABLET | ORAL | Status: DC

## 2014-04-25 MED ORDER — LEVOFLOXACIN 750 MG PO TABS
750.0000 mg | ORAL_TABLET | Freq: Once | ORAL | Status: AC
  Administered 2014-04-25: 750 mg via ORAL
  Filled 2014-04-25: qty 1

## 2014-04-25 MED ORDER — SENNOSIDES-DOCUSATE SODIUM 8.6-50 MG PO TABS: 1.0000 | ORAL_TABLET | Freq: Every evening | ORAL | Status: DC | PRN

## 2014-04-25 MED ORDER — LEVOFLOXACIN IN D5W 750 MG/150ML IV SOLN: 750.0000 mg | Freq: Once | INTRAVENOUS | Status: DC

## 2014-04-25 MED ORDER — FUROSEMIDE 40 MG PO TABS: 40.0000 mg | ORAL_TABLET | Freq: Two times a day (BID) | ORAL | Status: DC

## 2014-04-25 MED ORDER — IPRATROPIUM-ALBUTEROL 0.5-2.5 (3) MG/3ML IN SOLN
3.0000 mL | Freq: Four times a day (QID) | RESPIRATORY_TRACT | Status: DC
  Administered 2014-04-25 (×2): 3 mL via RESPIRATORY_TRACT
  Filled 2014-04-25 (×2): qty 3

## 2014-04-25 MED ORDER — LEVOFLOXACIN 750 MG PO TABS: 750.0000 mg | ORAL_TABLET | ORAL | Status: DC

## 2014-04-25 MED ORDER — DM-GUAIFENESIN ER 30-600 MG PO TB12: 1.0000 | ORAL_TABLET | Freq: Two times a day (BID) | ORAL | Status: DC

## 2014-04-25 MED ORDER — FLUTICASONE PROPIONATE HFA 44 MCG/ACT IN AERO
2.0000 | INHALATION_SPRAY | Freq: Two times a day (BID) | RESPIRATORY_TRACT | Status: DC
Start: 2014-04-25 — End: 2014-05-07

## 2014-04-25 MED ORDER — PREDNISONE 20 MG PO TABS
40.0000 mg | ORAL_TABLET | Freq: Every day | ORAL | Status: DC
  Administered 2014-04-25: 40 mg via ORAL
  Filled 2014-04-25 (×3): qty 2

## 2014-04-25 MED ORDER — PREDNISONE 10 MG PO TABS: 10.0000 mg | ORAL_TABLET | Freq: Every day | ORAL | Status: DC

## 2014-04-25 MED ORDER — FLUTICASONE PROPIONATE HFA 44 MCG/ACT IN AERO
2.0000 | INHALATION_SPRAY | Freq: Two times a day (BID) | RESPIRATORY_TRACT | Status: DC
  Filled 2014-04-25 (×2): qty 10.6

## 2014-04-25 NOTE — Progress Notes (Signed)
D/C'd tele; D/C'd IV; D/C'd pt.  Explained discharge instructions to pt.  Explained medication regimen in detail.  Pt had no further questions.  Pt in no acute distress.

## 2014-04-25 NOTE — Progress Notes (Signed)
Occupational Therapy Treatment Patient Details Name: [name] [name] MRN: [id number] DOB: 06-10-1920 Today's Date: 04/25/2014    History of present illness [name] [name]  is a 80 y.o. male, with known past medical history of pulmonary hypertension,[name] [name], chronic kidney [name], rheumatoid arthritis, gout, DVT on anticoagulation, started in August of [facility] secondary to acute CHF exacerbation, presents with complaints of shortness of breath, cough, productive sputum, his x-ray showing evidence of early pneumonia,   OT comments  Pt making steady progress. Educated pt on E conservation techniques and fall prevention. Continue to recommend HHOT to follow up with pt to continue to address IADL tasks and home safety as pt is caregiver for his demented wife. OT signing off. All further OT to be addressed by [facility].  Follow Up Recommendations  Home health OT;Supervision/Assistance - 24 hour    Equipment Recommendations  None recommended by OT    Recommendations for Other Services      Precautions / Restrictions Precautions Precautions: Fall       Mobility Bed Mobility Overal bed mobility: Modified Independent                Transfers Overall transfer level: Modified independent Equipment used: Rolling walker (2 wheeled)                  Balance           Standing balance support: Single extremity supported Standing balance-Leahy Scale: Fair                     ADL                                       Functional mobility during ADLs: Modified independent;Rolling walker General ADL Comments: Pt overall mod I with ADL. Educated pt on E conservation techniques and activity simplification. Discussed caring for his wife and utilizing these techniques. Also discussed fall prevention. Pt verbalized understanding.       Vision                     Perception     Praxis      Cognition   Behavior During  Therapy: WFL for tasks assessed/performed Overall Cognitive Status: Within Functional Limits for tasks assessed                       Extremity/Trunk Assessment               Exercises     Shoulder Instructions       General Comments      Pertinent Vitals/ Pain       Pain Assessment: No/denies pain  Home Living                                          Prior Functioning/Environment              Frequency Min 2X/week     Progress Toward Goals  OT Goals(current goals can now be found in the care plan section)  Progress towards OT goals: Goals met/education completed, patient discharged from OT (further OT to be addressed by [facility])  Acute Rehab OT Goals Patient Stated Goal: Able to help take care of my wife OT Goal  Formulation: With patient Time For Goal Achievement: 05/06/14 Potential to Achieve Goals: Good ADL Goals Pt Will Perform Lower Body Bathing: with supervision;sit to/from stand;with adaptive equipment Pt Will Perform Lower Body Dressing: with supervision;with adaptive equipment;sit to/from stand Pt Will Transfer to Toilet: with modified independence;ambulating Pt Will Perform Toileting - Clothing Manipulation and hygiene: with modified independence;sit to/from stand Additional ADL Goal #1: Pt will verbalize understanding of E conservation techniques for ADL.  Plan Discharge plan remains appropriate    Co-evaluation                 End of Session Equipment Utilized During Treatment: Rolling walker   Activity Tolerance Patient tolerated treatment well   Patient Left in chair;with call bell/phone within reach   Nurse Communication Mobility status        Time: 7001-7494 OT Time Calculation (min): 11 min  Charges: OT General Charges $OT Visit: 1 Procedure OT Treatments $Self Care/Home Management : 8-22 mins  [name] [name],[name] [name] 04/25/2014, 4:02 PM   [name] [name], OTR/L  [phone] 04/25/2014

## 2014-04-25 NOTE — Discharge Summary (Signed)
Physician Discharge Summary  Patient ID: [name] [name] MRN: [id number] DOB/AGE: [birth year] 80 y.o.  Admit date: 04/20/2014 Discharge date: 04/25/2014  Primary Care Physician:  [name], MD  Discharge Diagnoses:    . HCAP (healthcare-associated pneumonia) . HTN (hypertension) . CKD (chronic kidney disease), stage III . Rheumatoid arthritis . VTE (venous thromboembolism) . Zenker diverticulum . Debility . Chronic systolic heart failure  Consults:  None   Recommendations for Outpatient Follow-up:  Please check chest x-ray in 3-4 weeks to Evaluate for complete resolution of the pneumonia  Please note hydralazine and Imdur are currently on hold due to soft BP  Allergies:   Allergies  Allergen Reactions  . Iohexol Itching and Rash          Discharge Medications:   Medication List    STOP taking these medications        hydrALAZINE 25 MG tablet  Commonly known as:  APRESOLINE     isosorbide mononitrate 60 MG 24 hr tablet  Commonly known as:  IMDUR      TAKE these medications        Acai 25 MG Caps  Take 1 capsule by mouth daily.     albuterol 108 (90 BASE) MCG/ACT inhaler  Commonly known as:  PROVENTIL HFA;VENTOLIN HFA  Inhale 2 puffs into the lungs every 6 (six) hours as needed for wheezing or shortness of breath.     calcium-vitamin D 500-200 MG-UNIT per tablet  Commonly known as:  OSCAL WITH D  Take 1 tablet by mouth daily.     dextromethorphan-guaiFENesin 30-600 MG per 12 hr tablet  Commonly known as:  MUCINEX DM  Take 1 tablet by mouth 2 (two) times daily.     fluticasone 44 MCG/ACT inhaler  Commonly known as:  FLOVENT HFA  Inhale 2 puffs into the lungs 2 (two) times daily.     furosemide 40 MG tablet  Commonly known as:  LASIX  Take 1 tablet (40 mg total) by mouth 2 (two) times daily.     HYDROcodone-acetaminophen 5-325 MG per tablet  Commonly known as:  NORCO/VICODIN  as needed.     levofloxacin 750 MG tablet  Commonly known as:   LEVAQUIN  Take 1 tablet (750 mg total) by mouth every other day. X 3 doses  Start taking on:  04/27/2014     menthol-thymol Liqd  Apply 1 application topically as needed (for hip pain).     methotrexate 2.5 MG tablet  Commonly known as:  RHEUMATREX  Take 7.5 mg by mouth every Monday. Caution:Chemotherapy. Protect from light.     multivitamin tablet  Take 1 tablet by mouth daily.     Potassium Chloride ER 20 MEQ Tbcr  Take 20 mEq by mouth daily.     predniSONE 10 MG tablet  Commonly known as:  DELTASONE  Take 1 tablet (10 mg total) by mouth daily with breakfast. Hold until prednisone taper is down to 10mg      predniSONE 10 MG tablet  Commonly known as:  DELTASONE  Prednisone dosing: Take  Prednisone 40mg  (4 tabs) x 3 days, then taper to 30mg  (3 tabs) x 3 days, then 20mg  (2 tabs) x 3days, then continue your maintenance 10mg  dose daily     PROBIOTIC DAILY PO  Take 1 tablet by mouth daily.     senna-docusate 8.6-50 MG per tablet  Commonly known as:  Senokot-S  Take 1 tablet by mouth at bedtime as needed for mild constipation.  warfarin 5 MG tablet  Commonly known as:  COUMADIN  - Take 2.5-5 mg by mouth every morning. Takes 5mg  on Wed  - Takes 2.5mg  all other days         Brief H and P: For complete details please refer to admission H and P, but in brief patient is a 80 year old male with pulmonary hypertension, chronic kidney disease, edema died arthritis, gout, DVT on anticoagulation who was admitted in August secondary to acute CHF exacerbation. Patient presented with shortness of breath, cough, productive sputum. Chest x-ray showed evidence of early pneumonia. Patient was started on IV vancomycin and cefepime for HCAP.  Hospital Course:  Patient is a 80 year old male with pulmonary hypertension, CKD, rheumatoid arthritis, gout, DVT on anticoagulation, presented with shortness of breath, productive cough. Chest x-ray showed evidence of early pneumonia. Patient was  placed on IV vancomycin and cefepime for HCAP. (Patient had been admitted in August for acute CHF exacerbation)  HCAP (healthcare-associated pneumonia) Patient was admitted and placed on IV vancomycin and cefepime, started on 11/28, patient has received 6 days of antibiotics during hospitalization. Influenza negative, blood cultures negative so far. Patient was continued on antitussives, nebulizers, O2 via nasal cannula. Patient was given prescription for Levaquin for 3 more doses for completing the course for 14 days total. He has appointment with pulmonology, Dr. Shelle Iron on 12/15. Please obtain a repeat chest x-ray for complete resolution of pneumonia. He was continued on albuterol, also placed on Flovent inhaler, prednisone taper. PT OT recommended home health PT, OT, RN for follow-up, which was arranged by case management   HTN (hypertension) - Currently stable however somewhat soft, hence hydralazine and Imdur currently on hold   CKD (chronic kidney disease), stage III - Appears at baseline   Rheumatoid arthritis -Continue methotrexate and prednisone   VTE (venous thromboembolism) - Continue Coumadin per pharmacy   Chronic systolic heart failure - Patient had an episode of pulmonary edema on 11/30, responded to IV Lasix. Continue Lasix 40 mg twice a day  Day of Discharge BP 110/75 mmHg  Pulse 57  Temp(Src) 97.6 F (36.4 C) (Oral)  Resp 18  Ht 5\' 9"  (1.753 m)  Wt 79.198 kg (174 lb 9.6 oz)  BMI 25.77 kg/m2  SpO2 94%  Physical Exam: General: Alert and awake oriented x3 not in any acute distress. CVS: S1-S2 clear no murmur rubs or gallops Chest: Fine expiratory wheezing improving abdomen soft nontender, nondistended, normal bowel sounds Extremities: no cyanosis, clubbing or edema noted bilaterally Neuro: Cranial nerves II-XII intact, no focal neurological deficits   The results of significant diagnostics from this hospitalization (including imaging, microbiology,  ancillary and laboratory) are listed below for reference.    LAB RESULTS: Basic Metabolic Panel:  Recent Labs Lab 04/21/14 0250  04/23/14 0258 04/24/14 0610  NA 142  < > 137 139  K 4.2  < > 3.4* 3.3*  CL 103  < > 97 100  CO2 23  < > 20 21  GLUCOSE 161*  < > 91 93  BUN 37*  < > 42* 38*  CREATININE 1.52*  < > 1.43* 1.40*  CALCIUM 8.4  < > 8.2* 8.2*  MG 2.1  --   --   --   < > = values in this interval not displayed. Liver Function Tests: No results for input(s): AST, ALT, ALKPHOS, BILITOT, PROT, ALBUMIN in the last 168 hours. No results for input(s): LIPASE, AMYLASE in the last 168 hours. No results for input(s): AMMONIA in  the last 168 hours. CBC:  Recent Labs Lab 04/23/14 0258 04/24/14 0610  WBC 6.3 5.6  HGB 11.6* 12.7*  HCT 33.4* 36.6*  MCV 84.3 84.7  PLT 136* 168   Cardiac Enzymes: No results for input(s): CKTOTAL, CKMB, CKMBINDEX, TROPONINI in the last 168 hours. BNP: Invalid input(s): POCBNP CBG:  Recent Labs Lab 04/21/14 1656 04/22/14 2142  GLUCAP 148* 120*    Significant Diagnostic Studies:  Dg Chest 2 View  04/20/2014   CLINICAL DATA:  Shortness of breath, cough  EXAM: CHEST  2 VIEW  COMPARISON:  01/12/2014  FINDINGS: Patchy left lower lobe opacity, suspicious for pneumonia.  Underlying chronic interstitial markings. No pleural effusion or pneumothorax.  The heart is normal size.  Degenerative changes of the visualized thoracolumbar spine.  IMPRESSION: Patchy left lower lobe opacity, suspicious for pneumonia.   Electronically Signed   By: Charline Bills M.D.   On: 04/20/2014 15:27    2D ECHO:   Disposition and Follow-up:     Discharge Instructions    Diet - low sodium heart healthy    Complete by:  As directed      Discharge instructions    Complete by:  As directed   Please hold BP medications until follow-up with your primary care doctor.     Increase activity slowly    Complete by:  As directed             DISPOSITION: Home    DIET: Heart healthy diet   TESTS THAT NEED FOLLOW-UP Chest x-ray   DISCHARGE FOLLOW-UP Follow-up Information    Follow up with Barbaraann Share, MD On 05/07/2014.   Specialty:  Pulmonary Disease   Why:  at 10:45AM, for hospital follow-up   Contact information:   [address] [place] [address] [phone]       Follow up with [name], MD On 05/02/2014.   Specialty:  Family Medicine   Why:  Thursday @ 1:30 PM per [place] [place] information:   [address] [place] [address] [phone]       Follow up with [facility].   Specialty:  Home Health Services   Why:  Registered Nurse, Physical Therapy, Occupational Therapy, Home Health Aide Services to start within 24-48 hours of discharge   Contact information:   [address] [place] [address] [phone]       Time spent on Discharge: 39 mins  Signed:   Terrill Alperin M.D. [facility] 04/25/2014, 12:50 PM Pager: [phone]

## 2014-04-25 NOTE — Plan of Care (Signed)
Problem: Phase III Progression Outcomes Goal: O2 sats > or equal to 93% on room air Outcome: Completed/Met Date Met:  04/25/14 No acute events this shift.  Patient opted to sleep without O2 and did not have any shortness of breath.  See Flowsheets for O2 sat measurements. Will continue to monitor patient condition.

## 2014-04-25 NOTE — Progress Notes (Signed)
ANTICOAGULATION CONSULT NOTE - Follow Up Consult  Pharmacy Consult:  Coumadin Indication: hx DVT  Allergies  Allergen Reactions  . Iohexol Itching and Rash         Patient Measurements: Height: 5\' 9"  (175.3 cm) Weight: 174 lb 9.6 oz (79.198 kg) (scale B) IBW/kg (Calculated) : 70.7  Vital Signs: Temp: 97.6 F (36.4 C) (12/03 0457) Temp Source: Oral (12/03 0457) BP: 110/75 mmHg (12/03 1022) Pulse Rate: 57 (12/03 1134)  Labs:  Recent Labs  04/23/14 0258 04/24/14 0610 04/25/14 0405  HGB 11.6* 12.7*  --   HCT 33.4* 36.6*  --   PLT 136* 168  --   LABPROT 32.5* 27.4* 23.8*  INR 3.14* 2.53* 2.10*  CREATININE 1.43* 1.40*  --     Estimated Creatinine Clearance: 34.4 mL/min (by C-G formula based on Cr of 1.4).     Assessment: 80 y/o male on chronic warfarin for history of DVT.  INR remains therapeutic but continues to trend down.  Patient has been requiring lower Coumadin doses here than PTA, which is 2.5mg  PO daily except 5mg  on Wed.  No bleeding reported.  Noted patient is now on Levaquin, which may increase the effect of Coumadin.   Goal of Therapy:  INR 2-3     Plan:  - Coumadin 4mg  PO today if not discharged - Daily PT / INR    Montague Corella D. Sun, PharmD, BCPS Pager:  [phone] 04/25/2014, 2:07 PM

## 2014-04-25 NOTE — Progress Notes (Signed)
Physical Therapy Treatment Patient Details Name: [name] [name] MRN: [id number] DOB: 1920-11-07 Today's Date: May 15, 2014    History of Present Illness [name] [name]  is a 80 y.o. male, with known past medical history of pulmonary hypertension,Donna kidney disease, chronic kidney disease, rheumatoid arthritis, gout, DVT on anticoagulation, started in August of [facility] secondary to acute CHF exacerbation, presents with complaints of shortness of breath, cough, productive sputum, his x-ray showing evidence of early pneumonia,    PT Comments    Pt making steady progress.  Follow Up Recommendations  Home health PT;Supervision - Intermittent     Equipment Recommendations  None recommended by PT    Recommendations for Other Services       Precautions / Restrictions Precautions Precautions: Fall    Mobility  Bed Mobility                  Transfers Overall transfer level: Modified independent Equipment used: Rolling walker (2 wheeled) Transfers: Sit to/from Stand Sit to Stand: Modified independent (Device/Increase time)         General transfer comment: Incr time  Ambulation/Gait Ambulation/Gait assistance: Supervision Ambulation Distance (Feet): 220 Feet Assistive device: Rolling walker (2 wheeled) Gait Pattern/deviations: Step-through pattern;Decreased stride length Gait velocity: slower Gait velocity interpretation: Below normal speed for age/gender General Gait Details: Pt with SaO2 93-94% on RA with amb   Stairs            Wheelchair Mobility    Modified Rankin (Stroke Patients Only)       Balance Overall balance assessment: Needs assistance Sitting-balance support: No upper extremity supported Sitting balance-Leahy Scale: Normal     Standing balance support: No upper extremity supported Standing balance-Leahy Scale: Fair                      Cognition Arousal/Alertness: Awake/alert Behavior During Therapy: WFL for  tasks assessed/performed Overall Cognitive Status: Within Functional Limits for tasks assessed                      Exercises      General Comments        Pertinent Vitals/Pain Pain Assessment: No/denies pain    Home Living                      Prior Function            PT Goals (current goals can now be found in the care plan section) Progress towards PT goals: Progressing toward goals    Frequency  Min 3X/week    PT Plan Current plan remains appropriate    Co-evaluation             End of Session   Activity Tolerance: Patient tolerated treatment well Patient left: in chair;with call bell/phone within reach     Time: 1100-1119 PT Time Calculation (min) (ACUTE ONLY): 19 min  Charges:  $Gait Training: 8-22 mins                    G Codes:      Braxley Balandran 05-15-14, 11:38 AM  Skip Mayer PT [phone]

## 2014-04-25 NOTE — Care Management Note (Addendum)
    Page 1 of 2   04/25/2014     12:41:04 PM CARE MANAGEMENT NOTE 04/25/2014  Patient:  [name] [name], [name] [name]   Account Number:  [id number]  Date Initiated:  04/25/2014  Documentation initiated by:  Donato Schultz  Subjective/Objective Assessment:   HCAP     Action/Plan:   CM to follow for disposition needs   Anticipated DC Date:  04/25/2014   Anticipated DC Plan:  HOME W HOME HEALTH SERVICES      DC Planning Services  CM consult      [facility] Choice  HOME HEALTH   Choice offered to / List presented to:  C-1 Patient        HH arranged  HH-1 RN  HH-10 DISEASE MANAGEMENT  HH-2 PT  HH-3 OT  HH-4 NURSE'S AIDE      HH agency  [facility]   Status of service:  Completed, signed off Medicare Important Message given?  YES (If response is "NO", the following Medicare IM given date fields will be blank) Date Medicare IM given:  04/24/2014 Medicare IM given by:  Yoandri Congrove Date Additional Medicare IM given:   Additional Medicare IM given by:    Discharge Disposition:  HOME W HOME HEALTH SERVICES  Per UR Regulation:  Reviewed for med. necessity/level of care/duration of stay  If discussed at Long Length of Stay Meetings, dates discussed:   04/25/2014    Comments:  Donato Schultz RN, BSN, MSHL, CCM  Nurse - Case Manager,  (Unit [place])  [phone]  04/25/2014 Social:  From home with son and granddtr PT/OT RECS:  HH PT/OT DME RECS:  none Dispo Plan:  Home with HHS:  RN, PT, OT, HHA ([facility] [place] [facility]  notified)

## 2014-04-27 LAB — CULTURE, BLOOD (ROUTINE X 2)
CULTURE: NO GROWTH
Culture: NO GROWTH

## 2014-05-01 ENCOUNTER — Encounter: Payer: Self-pay | Admitting: Interventional Cardiology

## 2014-05-07 ENCOUNTER — Other Ambulatory Visit (HOSPITAL_COMMUNITY): Payer: Medicare HMO

## 2014-05-07 ENCOUNTER — Ambulatory Visit (INDEPENDENT_AMBULATORY_CARE_PROVIDER_SITE_OTHER): Payer: Medicare HMO | Admitting: Pulmonary Disease

## 2014-05-07 ENCOUNTER — Encounter: Payer: Self-pay | Admitting: Pulmonary Disease

## 2014-05-07 VITALS — BP 110/60 | HR 71 | Temp 98.0°F | Ht 69.0 in | Wt 168.8 lb

## 2014-05-07 DIAGNOSIS — R0609 Other forms of dyspnea: Secondary | ICD-10-CM

## 2014-05-07 NOTE — Patient Instructions (Signed)
Will schedule you for breathing studies, and see you back on the same day to review with you.

## 2014-05-07 NOTE — Progress Notes (Signed)
   Subjective:    Patient ID: [name] [name], male    DOB: [birth year], 80 y.o.   MRN: [id number]  HPI The patient is a 80 year old male who I've been asked to see for evaluation of possible COPD. He has a history of a cardiomyopathy with severe mitral regurgitation, and has known chronic congestive heart failure. He also has a long history of smoking, but has not done so in 30+ years. To his knowledge she has never had pulmonary function studies. He was recently in the hospital for a possible left lower lobe infiltrate, however his chest x-ray 2 days later showed acute pulmonary edema and bilateral effusions. The patient states that he gets short of breath just walking through his house, and rarely uses his albuterol. He currently has no significant cough and has minimal mucus that is clear. He denies any worsening lower extremity edema, but he does wear compression hose which helps. He is being treated with diuretics and other medication for his cardiomyopathy.   Review of Systems  Constitutional: Positive for unexpected weight change. Negative for fever.  HENT: Positive for sore throat. Negative for congestion, dental problem, ear pain, nosebleeds, postnasal drip, rhinorrhea, sinus pressure, sneezing and trouble swallowing.   Eyes: Negative for redness and itching.  Respiratory: Positive for cough and shortness of breath. Negative for chest tightness and wheezing.   Cardiovascular: Positive for leg swelling. Negative for palpitations.  Gastrointestinal: Negative for nausea and vomiting.  Genitourinary: Negative for dysuria.  Musculoskeletal: Negative for joint swelling.  Skin: Negative for rash.  Neurological: Negative for headaches.  Hematological: Does not bruise/bleed easily.  Psychiatric/Behavioral: Negative for dysphoric mood. The patient is nervous/anxious.        Objective:   Physical Exam Constitutional:  Well developed, no acute distress  HENT:  Nares patent without  discharge  Oropharynx without exudate, palate and uvula are normal  Eyes:  Perrla, eomi, no scleral icterus  Neck:  No JVD, no TMG  Cardiovascular:  Normal rate, regular rhythm, no rubs or gallops.  1/6 sem        Intact distal pulses, but decreased.  Pulmonary :  Normal breath sounds, no stridor or respiratory distress   No rhonchi, or wheezing, crackles both bases about 1/3 up  Abdominal:  Soft, nondistended, bowel sounds present.  No tenderness noted.   Musculoskeletal:  No lower extremity edema noted, compression hose in place  Lymph Nodes:  No cervical lymphadenopathy noted  Skin:  No cyanosis noted  Neurologic:  Alert, appropriate, moves all 4 extremities without obvious deficit.         Assessment & Plan:

## 2014-05-07 NOTE — Assessment & Plan Note (Signed)
The patient has significant dyspnea on exertion that I suspect is primarily related to his cardiomyopathy with chronic congestive heart failure, his mitral valve disease, as well as his age and musculoskeletal issues. He does have a history of smoking, but has not done so in over 30 years. To his knowledge, he has never had pulmonary function studies, and we will need to schedule these for evaluation. If he does indeed have COPD, we would consider a bronchodilator regimen to see if we can improve his quality of life. If he does not have COPD, and then we need to concentrate our efforts on his chronic heart disease.

## 2014-05-08 ENCOUNTER — Inpatient Hospital Stay (HOSPITAL_COMMUNITY)
Admission: EM | Admit: 2014-05-08 | Discharge: 2014-05-13 | DRG: 872 | Disposition: A | Discharge status: Skilled Nursing Facility | Payer: Medicare HMO | Attending: Internal Medicine | Admitting: Internal Medicine

## 2014-05-08 ENCOUNTER — Emergency Department (HOSPITAL_COMMUNITY): Payer: Medicare HMO

## 2014-05-08 ENCOUNTER — Encounter (HOSPITAL_COMMUNITY): Payer: Self-pay | Admitting: Emergency Medicine

## 2014-05-08 DIAGNOSIS — E440 Moderate protein-calorie malnutrition: Secondary | ICD-10-CM | POA: Diagnosis present

## 2014-05-08 DIAGNOSIS — Z7901 Long term (current) use of anticoagulants: Secondary | ICD-10-CM

## 2014-05-08 DIAGNOSIS — M109 Gout, unspecified: Secondary | ICD-10-CM | POA: Diagnosis present

## 2014-05-08 DIAGNOSIS — R6510 Systemic inflammatory response syndrome (SIRS) of non-infectious origin without acute organ dysfunction: Secondary | ICD-10-CM | POA: Diagnosis present

## 2014-05-08 DIAGNOSIS — Z87891 Personal history of nicotine dependence: Secondary | ICD-10-CM

## 2014-05-08 DIAGNOSIS — A419 Sepsis, unspecified organism: Principal | ICD-10-CM

## 2014-05-08 DIAGNOSIS — N183 Chronic kidney disease, stage 3 (moderate): Secondary | ICD-10-CM | POA: Diagnosis present

## 2014-05-08 DIAGNOSIS — R531 Weakness: Secondary | ICD-10-CM

## 2014-05-08 DIAGNOSIS — N179 Acute kidney failure, unspecified: Secondary | ICD-10-CM | POA: Diagnosis present

## 2014-05-08 DIAGNOSIS — R0600 Dyspnea, unspecified: Secondary | ICD-10-CM

## 2014-05-08 DIAGNOSIS — Z8701 Personal history of pneumonia (recurrent): Secondary | ICD-10-CM

## 2014-05-08 DIAGNOSIS — M25552 Pain in left hip: Secondary | ICD-10-CM | POA: Diagnosis present

## 2014-05-08 DIAGNOSIS — E860 Dehydration: Secondary | ICD-10-CM | POA: Diagnosis present

## 2014-05-08 DIAGNOSIS — Z86718 Personal history of other venous thrombosis and embolism: Secondary | ICD-10-CM

## 2014-05-08 DIAGNOSIS — I739 Peripheral vascular disease, unspecified: Secondary | ICD-10-CM | POA: Diagnosis present

## 2014-05-08 DIAGNOSIS — E872 Acidosis, unspecified: Secondary | ICD-10-CM | POA: Diagnosis present

## 2014-05-08 DIAGNOSIS — J9811 Atelectasis: Secondary | ICD-10-CM | POA: Diagnosis present

## 2014-05-08 DIAGNOSIS — M069 Rheumatoid arthritis, unspecified: Secondary | ICD-10-CM | POA: Diagnosis present

## 2014-05-08 DIAGNOSIS — T502X5A Adverse effect of carbonic-anhydrase inhibitors, benzothiadiazides and other diuretics, initial encounter: Secondary | ICD-10-CM | POA: Diagnosis present

## 2014-05-08 DIAGNOSIS — I10 Essential (primary) hypertension: Secondary | ICD-10-CM

## 2014-05-08 DIAGNOSIS — D649 Anemia, unspecified: Secondary | ICD-10-CM | POA: Diagnosis present

## 2014-05-08 DIAGNOSIS — I129 Hypertensive chronic kidney disease with stage 1 through stage 4 chronic kidney disease, or unspecified chronic kidney disease: Secondary | ICD-10-CM | POA: Diagnosis present

## 2014-05-08 DIAGNOSIS — W19XXXA Unspecified fall, initial encounter: Secondary | ICD-10-CM | POA: Diagnosis present

## 2014-05-08 DIAGNOSIS — J449 Chronic obstructive pulmonary disease, unspecified: Secondary | ICD-10-CM | POA: Diagnosis present

## 2014-05-08 DIAGNOSIS — Z9849 Cataract extraction status, unspecified eye: Secondary | ICD-10-CM

## 2014-05-08 DIAGNOSIS — Z89412 Acquired absence of left great toe: Secondary | ICD-10-CM | POA: Diagnosis not present

## 2014-05-08 DIAGNOSIS — B029 Zoster without complications: Secondary | ICD-10-CM

## 2014-05-08 DIAGNOSIS — Z79899 Other long term (current) drug therapy: Secondary | ICD-10-CM | POA: Diagnosis not present

## 2014-05-08 DIAGNOSIS — I272 Other secondary pulmonary hypertension: Secondary | ICD-10-CM | POA: Diagnosis present

## 2014-05-08 DIAGNOSIS — I5042 Chronic combined systolic (congestive) and diastolic (congestive) heart failure: Secondary | ICD-10-CM | POA: Diagnosis present

## 2014-05-08 DIAGNOSIS — I5022 Chronic systolic (congestive) heart failure: Secondary | ICD-10-CM | POA: Diagnosis present

## 2014-05-08 DIAGNOSIS — R5381 Other malaise: Secondary | ICD-10-CM | POA: Diagnosis present

## 2014-05-08 HISTORY — DX: Heart failure, unspecified: I50.9

## 2014-05-08 LAB — BASIC METABOLIC PANEL
Anion gap: 21 — ABNORMAL HIGH (ref 5–15)
BUN: 43 mg/dL — ABNORMAL HIGH (ref 6–23)
CALCIUM: 8.7 mg/dL (ref 8.4–10.5)
CO2: 23 mEq/L (ref 19–32)
CREATININE: 1.75 mg/dL — AB (ref 0.50–1.35)
Chloride: 94 mEq/L — ABNORMAL LOW (ref 96–112)
GFR calc Af Amer: 37 mL/min — ABNORMAL LOW (ref >90)
GFR calc non Af Amer: 32 mL/min — ABNORMAL LOW (ref >90)
GLUCOSE: 166 mg/dL — AB (ref 70–99)
Potassium: 4.2 mEq/L (ref 3.7–5.3)
SODIUM: 138 meq/L (ref 137–147)

## 2014-05-08 LAB — CBC WITH DIFFERENTIAL/PLATELET
BASOS ABS: 0.1 10*3/uL (ref 0.0–0.1)
Basophils Relative: 1 % (ref 0–1)
EOS ABS: 0 10*3/uL (ref 0.0–0.7)
Eosinophils Relative: 0 % (ref 0–5)
HEMATOCRIT: 37.1 % — AB (ref 39.0–52.0)
Hemoglobin: 12.7 g/dL — ABNORMAL LOW (ref 13.0–17.0)
LYMPHS ABS: 0.8 10*3/uL (ref 0.7–4.0)
LYMPHS PCT: 9 % — AB (ref 12–46)
MCH: 29.5 pg (ref 26.0–34.0)
MCHC: 34.2 g/dL (ref 30.0–36.0)
MCV: 86.1 fL (ref 78.0–100.0)
Monocytes Absolute: 0.2 10*3/uL (ref 0.1–1.0)
Monocytes Relative: 2 % — ABNORMAL LOW (ref 3–12)
NEUTROS ABS: 7.3 10*3/uL (ref 1.7–7.7)
Neutrophils Relative %: 88 % — ABNORMAL HIGH (ref 43–77)
PLATELETS: 125 10*3/uL — AB (ref 150–400)
RBC: 4.31 MIL/uL (ref 4.22–5.81)
RDW: 19.5 % — AB (ref 11.5–15.5)
WBC: 8.4 10*3/uL (ref 4.0–10.5)

## 2014-05-08 LAB — URINE MICROSCOPIC-ADD ON

## 2014-05-08 LAB — PRO B NATRIURETIC PEPTIDE: Pro B Natriuretic peptide (BNP): 4083 pg/mL — ABNORMAL HIGH (ref 0–450)

## 2014-05-08 LAB — URINALYSIS, ROUTINE W REFLEX MICROSCOPIC
BILIRUBIN URINE: NEGATIVE
GLUCOSE, UA: NEGATIVE mg/dL
Ketones, ur: NEGATIVE mg/dL
Leukocytes, UA: NEGATIVE
Nitrite: NEGATIVE
Protein, ur: NEGATIVE mg/dL
SPECIFIC GRAVITY, URINE: 1.01 (ref 1.005–1.030)
UROBILINOGEN UA: 0.2 mg/dL (ref 0.0–1.0)
pH: 7 (ref 5.0–8.0)

## 2014-05-08 LAB — I-STAT CG4 LACTIC ACID, ED: Lactic Acid, Venous: 7.14 mmol/L — ABNORMAL HIGH (ref 0.5–2.2)

## 2014-05-08 LAB — PROTIME-INR
INR: 2.34 — AB (ref 0.00–1.49)
PROTHROMBIN TIME: 25.8 s — AB (ref 11.6–15.2)

## 2014-05-08 LAB — TROPONIN I: Troponin I: <0.3 ng/mL (ref <0.30)

## 2014-05-08 LAB — PROCALCITONIN: Procalcitonin: <0.1 ng/mL

## 2014-05-08 LAB — GLUCOSE, CAPILLARY: GLUCOSE-CAPILLARY: 177 mg/dL — AB (ref 70–99)

## 2014-05-08 MED ORDER — HYDROMORPHONE HCL 1 MG/ML IJ SOLN: 1.0000 mg | INTRAMUSCULAR | Status: DC | PRN

## 2014-05-08 MED ORDER — VANCOMYCIN HCL 10 G IV SOLR
1500.0000 mg | Freq: Once | INTRAVENOUS | Status: AC
  Administered 2014-05-08: 1500 mg via INTRAVENOUS
  Filled 2014-05-08: qty 1500

## 2014-05-08 MED ORDER — DEXTROSE 5 % IV SOLN
1.0000 g | INTRAVENOUS | Status: DC
  Administered 2014-05-08 – 2014-05-09 (×2): 1 g via INTRAVENOUS
  Filled 2014-05-08 (×3): qty 1

## 2014-05-08 MED ORDER — ACETAMINOPHEN 650 MG RE SUPP: 650.0000 mg | Freq: Four times a day (QID) | RECTAL | Status: DC | PRN

## 2014-05-08 MED ORDER — ALUM & MAG HYDROXIDE-SIMETH 200-200-20 MG/5ML PO SUSP: 30.0000 mL | Freq: Four times a day (QID) | ORAL | Status: DC | PRN

## 2014-05-08 MED ORDER — VALACYCLOVIR HCL 500 MG PO TABS
1000.0000 mg | ORAL_TABLET | ORAL | Status: DC
  Administered 2014-05-08 – 2014-05-12 (×5): 1000 mg via ORAL
  Filled 2014-05-08 (×8): qty 2

## 2014-05-08 MED ORDER — WARFARIN - PHARMACIST DOSING INPATIENT
Freq: Every day | Status: DC
  Administered 2014-05-10 – 2014-05-11 (×2)

## 2014-05-08 MED ORDER — ONDANSETRON HCL 4 MG PO TABS: 4.0000 mg | ORAL_TABLET | Freq: Four times a day (QID) | ORAL | Status: DC | PRN

## 2014-05-08 MED ORDER — CALCIUM CARBONATE-VITAMIN D 500-200 MG-UNIT PO TABS
1.0000 | ORAL_TABLET | Freq: Every day | ORAL | Status: DC
  Administered 2014-05-08 – 2014-05-13 (×6): 1 via ORAL
  Filled 2014-05-08 (×6): qty 1

## 2014-05-08 MED ORDER — SENNOSIDES-DOCUSATE SODIUM 8.6-50 MG PO TABS
1.0000 | ORAL_TABLET | Freq: Every evening | ORAL | Status: DC | PRN
  Filled 2014-05-08: qty 1

## 2014-05-08 MED ORDER — ACETAMINOPHEN 325 MG PO TABS: 650.0000 mg | ORAL_TABLET | Freq: Four times a day (QID) | ORAL | Status: DC | PRN

## 2014-05-08 MED ORDER — SODIUM CHLORIDE 0.9 % IV BOLUS (SEPSIS)
500.0000 mL | Freq: Once | INTRAVENOUS | Status: AC
  Administered 2014-05-08: 500 mL via INTRAVENOUS

## 2014-05-08 MED ORDER — SODIUM CHLORIDE 0.9 % IJ SOLN
3.0000 mL | Freq: Two times a day (BID) | INTRAMUSCULAR | Status: DC
  Administered 2014-05-08 – 2014-05-13 (×9): 3 mL via INTRAVENOUS

## 2014-05-08 MED ORDER — PIPERACILLIN-TAZOBACTAM 3.375 G IVPB 30 MIN
3.3750 g | Freq: Once | INTRAVENOUS | Status: AC
  Administered 2014-05-08: 3.375 g via INTRAVENOUS
  Filled 2014-05-08: qty 50

## 2014-05-08 MED ORDER — HYDROCODONE-ACETAMINOPHEN 5-325 MG PO TABS
1.0000 | ORAL_TABLET | Freq: Four times a day (QID) | ORAL | Status: DC | PRN
  Administered 2014-05-09: 1 via ORAL
  Filled 2014-05-08: qty 1

## 2014-05-08 MED ORDER — ALBUTEROL SULFATE (2.5 MG/3ML) 0.083% IN NEBU
3.0000 mL | INHALATION_SOLUTION | Freq: Four times a day (QID) | RESPIRATORY_TRACT | Status: DC | PRN
  Administered 2014-05-12 (×2): 3 mL via RESPIRATORY_TRACT
  Filled 2014-05-08 (×2): qty 3

## 2014-05-08 MED ORDER — DM-GUAIFENESIN ER 30-600 MG PO TB12
1.0000 | ORAL_TABLET | Freq: Two times a day (BID) | ORAL | Status: DC
  Administered 2014-05-08 – 2014-05-13 (×10): 1 via ORAL
  Filled 2014-05-08 (×11): qty 1

## 2014-05-08 MED ORDER — VANCOMYCIN HCL IN DEXTROSE 750-5 MG/150ML-% IV SOLN
750.0000 mg | INTRAVENOUS | Status: DC
  Administered 2014-05-09: 750 mg via INTRAVENOUS
  Filled 2014-05-08 (×2): qty 150

## 2014-05-08 MED ORDER — ONDANSETRON HCL 4 MG/2ML IJ SOLN: 4.0000 mg | Freq: Four times a day (QID) | INTRAMUSCULAR | Status: DC | PRN

## 2014-05-08 NOTE — ED Notes (Signed)
Attempted report 

## 2014-05-08 NOTE — ED Notes (Signed)
Patient transported to CT 

## 2014-05-08 NOTE — H&P (Signed)
History and Physical       Hospital Admission Note Date: 05/08/2014  Patient name: [name] [name] Medical record number: [id number] Date of birth: Nov 13, 1920 Age: 80 y.o. Gender: male  PCP: [name], MD    Chief Complaint:  Fall with generalized weakness, left hip pain  HPI: Patient is a 80 year old male with history of chronic systolic CHF, DVT on Coumadin, CKD stage III, anemia, peripheral vascular disease, hypertension, rheumatoid arthritis who presented to ED after a fall last night. Patient was recently discharged on 04/25/14 after HCAP and pulmonary edema with acute on chronic systolic CHF. Patient lives at home with his wife and son.  History was obtained from the patient and son in the room. Patient apparently fell last night after getting up from the bed on his left side, ever since then has trouble with his left hip pain. Patient reports that his left hip gives out. Patient was doing well after discharge, complained of increasing weakness and shortness of breath in the last 2-3 days. Patient's son also reported that he has not been eating well and complaining of being thirsty. Patient denied hitting his head yesterday, denied any fevers, chills, chest pain, nausea or vomiting or diarrhea.  Patient was seen by Dr. Shelle Iron as hospital follow-up yesterday, recommended pulmonary function tests otherwise stable.  Review of Systems:  Constitutional: Denies fever, chills, diaphoresis, poor appetite and fatigue.  HEENT: Denies photophobia, eye pain, redness, hearing loss, ear pain, congestion, sore throat, rhinorrhea, sneezing, mouth sores, trouble swallowing, neck pain, neck stiffness and tinnitus.   Respiratory: Denies chest tightness,  and wheezing.  + chronic shortness of breath with dyspnea on exertion Cardiovascular: Denies chest pain, palpitations and leg swelling.  Gastrointestinal: Denies nausea, vomiting, abdominal pain,  diarrhea, constipation, blood in stool and abdominal distention.  Genitourinary: Denies dysuria, urgency, frequency, hematuria, flank pain and difficulty urinating.  Musculoskeletal: Denies myalgias, back pain, joint swelling, arthralgias and gait problem.  left hip pain+  Skin: Denies pallor, rash and wound.  Neurological: Denies dizziness, seizures, syncope, weakness, light-headedness, numbness and headaches.  Hematological: Denies adenopathy. Easy bruising, personal or family bleeding history  Psychiatric/Behavioral: Denies suicidal ideation, mood changes, confusion, nervousness, sleep disturbance and agitation  Past Medical History: Past Medical History  Diagnosis Date  . Hypertension   . Chronic kidney disease   . PAD (peripheral artery disease)   . Rheumatoid arthritis(714.0)   . CKD (chronic kidney disease), stage III   . Anemia   . Gout   . COPD (chronic obstructive pulmonary disease)   . DVT, lower extremity   . Chronic anticoagulation    Past Surgical History  Procedure Laterality Date  . Hernia repair  1982    right  . Cataract extraction    . Amputation  05/02/2011    Procedure: AMPUTATION DIGIT;  Surgeon: Kathryne Hitch;  Location: [facility] OR;  Service: Orthopedics;  Laterality: Left;  left great toe  . Colonoscopy w/ biopsies and polypectomy      Hx: of  . Amputation Left 07/03/2013    Procedure: LEFT 2ND TOE AMPUTATION ;  Surgeon: Kathryne Hitch, MD;  Location: [facility] OR;  Service: Orthopedics;  Laterality: Left;  . Esophageal dilation      Medications: Prior to Admission medications   Medication Sig Start Date End Date Taking? Authorizing Provider  Acai 25 MG CAPS Take 1 capsule by mouth daily.    Historical Provider, MD  albuterol (PROVENTIL HFA;VENTOLIN HFA) 108 (90 BASE) MCG/ACT inhaler Inhale 2  puffs into the lungs every 6 (six) hours as needed for wheezing or shortness of breath.    Historical Provider, MD  calcium-vitamin D (OSCAL WITH D) 500-200  MG-UNIT per tablet Take 1 tablet by mouth daily.     Historical Provider, MD  dextromethorphan-guaiFENesin (MUCINEX DM) 30-600 MG per 12 hr tablet Take 1 tablet by mouth 2 (two) times daily. 04/25/14   Ripudeep Jenna Luo, MD  furosemide (LASIX) 40 MG tablet Take 1 tablet (40 mg total) by mouth 2 (two) times daily. 04/25/14   Ripudeep Jenna Luo, MD  HYDROcodone-acetaminophen (NORCO/VICODIN) 5-325 MG per tablet as needed. 03/23/14   Historical Provider, MD  menthol-thymol (ABSORBINE JR) LIQD Apply 1 application topically as needed (for hip pain).    Historical Provider, MD  methotrexate (RHEUMATREX) 2.5 MG tablet Take 7.5 mg by mouth every Monday. Caution:Chemotherapy. Protect from light.    Historical Provider, MD  Multiple Vitamin (MULTIVITAMIN) tablet Take 1 tablet by mouth daily.    Historical Provider, MD  Potassium Chloride ER 20 MEQ TBCR Take 20 mEq by mouth daily. 01/25/14   Lyn Records III, MD  predniSONE (DELTASONE) 10 MG tablet Prednisone dosing: Take  Prednisone 40mg  (4 tabs) x 3 days, then taper to 30mg  (3 tabs) x 3 days, then 20mg  (2 tabs) x 3days, then continue your maintenance 10mg  dose daily 04/25/14   Ripudeep , MD  Probiotic Product (PROBIOTIC DAILY PO) Take 1 tablet by mouth daily.    Historical Provider, MD  senna-docusate (SENOKOT-S) 8.6-50 MG per tablet Take 1 tablet by mouth at bedtime as needed for mild constipation. 04/25/14   Ripudeep 14/3/15, MD  warfarin (COUMADIN) 5 MG tablet Take 2.5-5 mg by mouth every morning. Takes 5mg  on Wed Takes 2.5mg  all other days    Historical Provider, MD    Allergies:   Allergies  Allergen Reactions  . Iohexol Itching and Rash         Social History:  reports that he quit smoking about 35 years ago. His smoking use included Cigarettes. He has a 30 pack-year smoking history. He has never used smokeless tobacco. He reports that he does not drink alcohol or use illicit drugs.  Family History: Family History  Problem Relation Age of Onset  .  Hypertension Mother   . Heart disease Father     CAD    Physical Exam: Blood pressure 101/72, pulse 105, temperature 97.7 F (36.5 C), temperature source Rectal, resp. rate 22, SpO2 94 %. General: Alert, awake, oriented x3, in no acute distress. HEENT: normocephalic, atraumatic, anicteric sclera, pink conjunctiva, pupils equal and reactive to light and accomodation, oropharynx clear Neck: supple, no masses or lymphadenopathy, no goiter, no bruits  Heart: Regular rate and rhythm, without murmurs, rubs or gallops. Lungs: Decreased breath sounds at the bases otherwise clear, no rhonchi or wheezing. Abdomen: Soft, nontender, nondistended, positive bowel sounds, no masses. Extremities: No clubbing, cyanosis or edema with positive pedal pulses. Neuro: Grossly intact, no focal neurological deficits, strength 5/5 upper and lower extremities bilaterally Psych: alert and oriented x 3, normal mood and affect Skin: no rashes or lesions, warm and dry   LABS on Admission:  Basic Metabolic Panel:  Recent Labs Lab 05/08/14 0940  NA 138  K 4.2  CL 94*  CO2 23  GLUCOSE 166*  BUN 43*  CREATININE 1.75*  CALCIUM 8.7   Liver Function Tests: No results for input(s): AST, ALT, ALKPHOS, BILITOT, PROT, ALBUMIN in the last 168 hours. No results  for input(s): LIPASE, AMYLASE in the last 168 hours. No results for input(s): AMMONIA in the last 168 hours. CBC:  Recent Labs Lab 05/08/14 1100  WBC PENDING  NEUTROABS PENDING  HGB 12.7*  HCT 37.1*  MCV 86.1  PLT PENDING   Cardiac Enzymes:  Recent Labs Lab 05/08/14 0940  TROPONINI <0.30   BNP: Invalid input(s): POCBNP CBG: No results for input(s): GLUCAP in the last 168 hours.   Radiological Exams on Admission: Dg Chest 2 View  05/08/2014   CLINICAL DATA:  80 year old male who fell this morning with shortness of breath and left hip pain. Initial encounter.  EXAM: CHEST  2 VIEW  COMPARISON:  04/22/2014 and earlier.  FINDINGS: Seated  upright AP and lateral views of the chest. The patient arms are not raised. Stable cardiomegaly and mediastinal contours. No pneumothorax or pulmonary edema. Mildly increased elevation of the left hemidiaphragm and streaky left lung base opacity. No other acute pulmonary opacity. Osteopenia. Chronic deformity of the proximal left humerus. Calcified aortic atherosclerosis.  IMPRESSION: Chronic cardiomegaly with mildly increased left lung base opacity, favor atelectasis.   Electronically Signed   By: Augusto Gamble M.D.   On: 05/08/2014 10:20   Dg Chest 2 View  04/20/2014   CLINICAL DATA:  Shortness of breath, cough  EXAM: CHEST  2 VIEW  COMPARISON:  01/12/2014  FINDINGS: Patchy left lower lobe opacity, suspicious for pneumonia.  Underlying chronic interstitial markings. No pleural effusion or pneumothorax.  The heart is normal size.  Degenerative changes of the visualized thoracolumbar spine.  IMPRESSION: Patchy left lower lobe opacity, suspicious for pneumonia.   Electronically Signed   By: Charline Bills M.D.   On: 04/20/2014 15:27   Dg Hip Complete Left  05/08/2014   CLINICAL DATA:  80 year old male with history of trauma from a fall this morning complaining of anterior left hip pain.  EXAM: LEFT HIP - COMPLETE 2+ VIEW  COMPARISON:  No priors.  FINDINGS: AP view of the pelvis and AP and lateral views of the left hip demonstrate no acute displaced fracture of the bony pelvic ring. Bilateral proximal femurs as visualized appear intact, in the left femoral head is properly located. Joint space narrowing, subchondral sclerosis, subchondral cyst formation and osteophyte formation is noted in the hip joints bilaterally, compatible with moderate osteoarthritis. Numerous vascular calcifications are noted.  IMPRESSION: 1. No acute radiographic abnormality of the bony pelvis or left hip. 2. Moderate bilateral hip joint osteoarthritis. 3. Atherosclerosis.   Electronically Signed   By: Trudie Reed M.D.   On:  05/08/2014 10:21   Ct Head Wo Contrast  05/08/2014   CLINICAL DATA:  80 year old male who fell with head injury last night. Gait and balance problems. Shortness of Breath. Initial encounter.  EXAM: CT HEAD WITHOUT CONTRAST  TECHNIQUE: Contiguous axial images were obtained from the base of the skull through the vertex without intravenous contrast.  COMPARISON:  Head CT without contrast 10/04/2010.  FINDINGS: No acute orbits soft tissue findings. Minimal to Mild scalp hematoma or contusion at the posterior vertex. Other scalp soft tissues within normal limits. Calvarium intact.  Chronic polypoid opacity in the left sphenoid sinus, partially calcified and increased since 2012. Other Visualized paranasal sinuses and mastoids are clear.  Interval age congruent cerebral volume loss. Chronic dural calcifications. No ventriculomegaly. No midline shift, mass effect, or evidence of intracranial mass lesion. Small chronic lacunar infarct in the right cerebellum is new. Patchy increased bilateral periventricular white matter hypodensity. No evidence of  cortically based acute infarction identified. No acute intracranial hemorrhage identified. No suspicious intracranial vascular hyperdensity.  IMPRESSION: 1. Mild posterior scalp soft tissue injury without underlying fracture. 2. No acute intracranial abnormality. Mild progression of nonspecific white matter changes and cerebellar small vessel ischemia since 2012.   Electronically Signed   By: Augusto Gamble M.D.   On: 05/08/2014 10:14   Dg Chest Port 1 View  04/22/2014   CLINICAL DATA:  Acute onset of shortness of breath. Subsequent study.  EXAM: PORTABLE CHEST - 1 VIEW  COMPARISON:  Chest radiograph performed 04/20/2014  FINDINGS: The lungs are well-aerated. Bibasilar airspace opacities are noted, with associated vascular congestion and small bilateral pleural effusions. This is concerning for pulmonary edema. No pneumothorax is seen.  The cardiomediastinal silhouette is  borderline normal in size. No acute osseous abnormalities are seen. Chronic deformity is noted at the proximal left humerus.  IMPRESSION: Bibasilar airspace opacities, with associated vascular congestion and small bilateral pleural effusions, concerning for pulmonary edema.   Electronically Signed   By: Roanna Raider M.D.   On: 04/22/2014 03:28    Assessment/Plan Principal Problem:   SIRS (systemic inflammatory response syndrome), recent HCAP, lactic acidosis, patient has recently finished his antibiotics - For now, obtain blood cultures, pro-calcitonin, was started on IV vancomycin and Zosyn in ED, will continue for 24 hours, if no fevers or worsening leukocytosis, will discontinue - UA negative for UTI  Active Problems: Lactic acidosis with dehydration: Likely due to overdiuresis with mild acute kidney injury, Cr 1.75 - The patient was discharged on Lasix 40 mg twice a day, has not been eating well and the last 3-4 days but continuing his medications, patient received IV fluids in ER. - Creatinine was 1.4 at the time of discharge, now 1.75, continue to hold Lasix    HTN (hypertension) -Currently borderline soft, continue to hold Lasix     Debility - PTOT evaluation in a.m., patient and son agreeable to skilled nursing facility if needed     Chronic systolic heart failure -Currently slightly on the dry side, hold Lasix today, patient has already taken his 40 mg of oral Lasix this morning  Shingles rash - On the right side of the neck, patient is started on Valtrex and Contac/air-bone precautions gles rash   fall with left hip pain  - No dislocation or fracture, follow PT OT evaluation, pain control   History of DVT, continue Coumadin per pharmacy  DVT prophylaxis: Coumadin  CODE STATUS:  discussed in detail with the patient, full CODE STATUS   Family Communication: Admission, patients condition and plan of care including tests being ordered have been discussed with the patient  and son who indicates understanding and agree with the plan and Code Status   Further plan will depend as patient's clinical course evolves and further radiologic and laboratory data become available.   Time Spent on Admission: 1 hour  RAI,RIPUDEEP M.D. [facility] 05/08/2014, 11:37 AM Pager: [phone]  If 7PM-7AM, please contact night-coverage [email or web address] Password [email or web address]

## 2014-05-08 NOTE — ED Notes (Signed)
Pt arrived with son. Pt stated that he fell last night and ever since has been having trouble with his left hip. Stated that his left hip gives out. Pt also c/o SOB and increased weakness x 2 days. Denies hitting head when he fell last night. Pt discharged from hospital Dec 3 after tx for Pneumonia.

## 2014-05-08 NOTE — Progress Notes (Signed)
ANTICOAGULATION CONSULT NOTE - Initial Consult  Pharmacy Consult for Coumadin Indication: DVT  Allergies  Allergen Reactions  . Iohexol Itching and Rash         Patient Measurements:    Vital Signs: Temp: 97.8 F (36.6 C) (12/16 1356) Temp Source: Oral (12/16 1356) BP: 108/88 mmHg (12/16 1730) Pulse Rate: 99 (12/16 1730)  Labs:  Recent Labs  05/08/14 0940 05/08/14 1100 05/08/14 1209  HGB  --  12.7*  --   HCT  --  37.1*  --   PLT  --  125*  --   LABPROT  --   --  25.8*  INR  --   --  2.34*  CREATININE 1.75*  --   --   TROPONINI <0.30  --   --     Estimated Creatinine Clearance: 27.5 mL/min (by C-G formula based on Cr of 1.75).   Medical History: Past Medical History  Diagnosis Date  . Hypertension   . Chronic kidney disease   . PAD (peripheral artery disease)   . Rheumatoid arthritis(714.0)   . CKD (chronic kidney disease), stage III   . Anemia   . Gout   . COPD (chronic obstructive pulmonary disease)   . DVT, lower extremity   . Chronic anticoagulation     Medications:   (Not in a hospital admission) Scheduled:  . [START ON 05/09/2014] vancomycin  750 mg Intravenous Q24H    Assessment: 80yo male presenting for generalize weakness. Pharmacy is consulted to dose warfarin for VTE treatment. Patient had been on warfarin 2.5mg  daily for VTE treatment prior to admission. INR on presentation is therapeutic at 2.34, Hgb 12.7, Plt 125.  Pt reports taking warfarin today.  Goal of Therapy:  INR 2-3 Monitor platelets by anticoagulation protocol: Yes   Plan:  Daily INR Continue to monitor H&H and platelets  Monitor s/sx of bleeding  Arlean Hopping. Newman Pies, PharmD Clinical Pharmacist Pager [phone] 05/08/2014,5:48 PM

## 2014-05-08 NOTE — ED Provider Notes (Signed)
CSN: [id number]     Arrival date & time 05/08/14  0901 History   First MD Initiated Contact with Patient 05/08/14 [phone]     Chief Complaint  Patient presents with  . Fall  . Weakness  . Shortness of Breath     (Consider location/radiation/quality/duration/timing/severity/associated sxs/prior Treatment) HPI Comments: 80 year old male with calm to [facility] medical history including peripheral arterial disease, kidney disease, rheumatoid arthritis, DVT, Coumadin use, osteomyelitis and interpretation of left toes, pulmonary hypertension, systolic and diastolic heart failure on Lasix 80 mg daily, recent admission for pneumonia, walk or use, lives with wife with dementia and son help take care of him presents with worsening general weakness, giving away of left hip and left leg, recent falls without head injury and worsening shortness of breath. Patient feels the symptoms gradually worsened the past few days. Patient feels he is generally weaker and then twice his left leg gave way causing him to fall. Patient denies any significant pain from the fall however patient is on Coumadin. Patient denies any stroke history or leading in the brain. Patient denies any fevers or urinary symptoms. Patient does not think he is currently on antibiotics however recently finished. Patient has had general weakness in the past with infection as the cause.  Patient is a 80 y.o. male presenting with fall, weakness, and shortness of breath. The history is provided by the patient and a relative.  Fall Associated symptoms include shortness of breath. Pertinent negatives include no chest pain, no abdominal pain and no headaches.  Weakness Associated symptoms include shortness of breath. Pertinent negatives include no chest pain, no abdominal pain and no headaches.  Shortness of Breath Associated symptoms: cough, fever and neck pain (chronic arthritis)   Associated symptoms: no abdominal pain, no chest pain, no headaches, no  rash and no vomiting     Past Medical History  Diagnosis Date  . Hypertension   . Chronic kidney disease   . PAD (peripheral artery disease)   . Rheumatoid arthritis(714.0)   . CKD (chronic kidney disease), stage III   . Anemia   . Gout   . COPD (chronic obstructive pulmonary disease)   . DVT, lower extremity   . Chronic anticoagulation    Past Surgical History  Procedure Laterality Date  . Hernia repair  1982    right  . Cataract extraction    . Amputation  05/02/2011    Procedure: AMPUTATION DIGIT;  Surgeon: Kathryne Hitch;  Location: [facility] OR;  Service: Orthopedics;  Laterality: Left;  left great toe  . Colonoscopy w/ biopsies and polypectomy      Hx: of  . Amputation Left 07/03/2013    Procedure: LEFT 2ND TOE AMPUTATION ;  Surgeon: Kathryne Hitch, MD;  Location: [facility] OR;  Service: Orthopedics;  Laterality: Left;  . Esophageal dilation     Family History  Problem Relation Age of Onset  . Hypertension Mother   . Heart disease Father     CAD   History  Substance Use Topics  . Smoking status: Former Smoker -- 1.00 packs/day for 30 years    Types: Cigarettes    Quit date: 05/24/1978  . Smokeless tobacco: Never Used  . Alcohol Use: No    Review of Systems  Constitutional: Positive for fever, appetite change and unexpected weight change (weight loss). Negative for chills.  HENT: Negative for congestion.   Eyes: Negative for visual disturbance.  Respiratory: Positive for cough and shortness of breath.   Cardiovascular: Negative for  chest pain.  Gastrointestinal: Positive for nausea. Negative for vomiting, abdominal pain and blood in stool.  Genitourinary: Negative for dysuria and flank pain.  Musculoskeletal: Positive for back pain (chronic) and neck pain (chronic arthritis). Negative for neck stiffness.  Skin: Negative for rash.  Neurological: Positive for weakness (general). Negative for syncope, light-headedness and headaches.      Allergies   Iohexol  Home Medications   Prior to Admission medications   Medication Sig Start Date End Date Taking? Authorizing Provider  Acai 25 MG CAPS Take 1 capsule by mouth daily.    Historical Provider, MD  albuterol (PROVENTIL HFA;VENTOLIN HFA) 108 (90 BASE) MCG/ACT inhaler Inhale 2 puffs into the lungs every 6 (six) hours as needed for wheezing or shortness of breath.    Historical Provider, MD  calcium-vitamin D (OSCAL WITH D) 500-200 MG-UNIT per tablet Take 1 tablet by mouth daily.     Historical Provider, MD  dextromethorphan-guaiFENesin (MUCINEX DM) 30-600 MG per 12 hr tablet Take 1 tablet by mouth 2 (two) times daily. 04/25/14   Ripudeep Jenna Luo, MD  furosemide (LASIX) 40 MG tablet Take 1 tablet (40 mg total) by mouth 2 (two) times daily. 04/25/14   Ripudeep Jenna Luo, MD  HYDROcodone-acetaminophen (NORCO/VICODIN) 5-325 MG per tablet as needed. 03/23/14   Historical Provider, MD  menthol-thymol (ABSORBINE JR) LIQD Apply 1 application topically as needed (for hip pain).    Historical Provider, MD  methotrexate (RHEUMATREX) 2.5 MG tablet Take 7.5 mg by mouth every Monday. Caution:Chemotherapy. Protect from light.    Historical Provider, MD  Multiple Vitamin (MULTIVITAMIN) tablet Take 1 tablet by mouth daily.    Historical Provider, MD  Potassium Chloride ER 20 MEQ TBCR Take 20 mEq by mouth daily. 01/25/14   Lyn Records III, MD  predniSONE (DELTASONE) 10 MG tablet Prednisone dosing: Take  Prednisone 40mg  (4 tabs) x 3 days, then taper to 30mg  (3 tabs) x 3 days, then 20mg  (2 tabs) x 3days, then continue your maintenance 10mg  dose daily 04/25/14   Ripudeep Jenna Luo, MD  Probiotic Product (PROBIOTIC DAILY PO) Take 1 tablet by mouth daily.    Historical Provider, MD  senna-docusate (SENOKOT-S) 8.6-50 MG per tablet Take 1 tablet by mouth at bedtime as needed for mild constipation. 04/25/14   Ripudeep Jenna Luo, MD  warfarin (COUMADIN) 5 MG tablet Take 2.5-5 mg by mouth every morning. Takes 5mg  on Wed Takes 2.5mg  all  other days    Historical Provider, MD   BP 107/81 mmHg  Pulse 108  Temp(Src) 97.7 F (36.5 C) (Rectal)  Resp 22  SpO2 97% Physical Exam  Constitutional: He is oriented to person, place, and time. He appears well-developed and well-nourished.  HENT:  Head: Normocephalic and atraumatic.  Eyes: Right eye exhibits no discharge. Left eye exhibits no discharge.  Neck: Normal range of motion. Neck supple. No tracheal deviation present.  Cardiovascular: Regular rhythm.  Tachycardia present.   Pulmonary/Chest: Effort normal. He has rales (at bases bilateral, no increased effort at rest).  Abdominal: Soft. He exhibits no distension. There is no tenderness. There is no guarding.  Musculoskeletal: He exhibits no edema.  No focal hip pain with flexion  Neurological: He is alert and oriented to person, place, and time.  General weakness, pt moves all ext equal bilat, no drift, pt unable to hold arms at shoulder height due to chronic shoulder pain.   Sensation equal ue and le Visual fields intact  neck supple, normal speech except mild stutter,  no facial droop  Skin: Skin is warm. No rash noted.  Toe amputation previous in foot, no signs of active foot infection, no warmth/ drainage or erythema, mild scarring Mild LE swelling bilateral  Psychiatric: He has a normal mood and affect.  Nursing note and vitals reviewed.   ED Course  Procedures (including critical care time)   EMERGENCY DEPARTMENT [place] CARDIAC EXAM "Study: Limited Ultrasound of the heart and pericardium"  INDICATIONS:Tachycardia and Dyspnea Multiple views of the heart and pericardium were obtained in real-time with a multi-frequency probe.  PERFORMED ZO:XWRUEA  IMAGES ARCHIVED?: Yes  FINDINGS: No pericardial effusion, Decreased contractility, IVC normal and Tamponade physiology absent   VIEWS USED: Subcostal 4 chamber, Parasternal long axis, Parasternal short axis, Apical 4 chamber  and Inferior Vena Cava  INTERPRETATION:  Cardiac activity present, Pericardial effusioin absent, Cardiac tamponade absent, Volume status normal and Decreased contractility  Emergency Ultrasound: Limited Thoracic Performed and interpreted by Dr Jodi Mourning Longitudinal view of anterior left and right lung fields in real-time with linear probe. Indication: dyspnea Findings: pos lung sliding no significant B lines Interpretation: no evidence of pneumothorax. No significant pulm edema Images electronically archived.      Labs Review Labs Reviewed  BASIC METABOLIC PANEL - Abnormal; Notable for the following:    Chloride 94 (*)    Glucose, Bld 166 (*)    BUN 43 (*)    Creatinine, Ser 1.75 (*)    GFR calc non Af Amer 32 (*)    GFR calc Af Amer 37 (*)    Anion gap 21 (*)    All other components within normal limits  PRO B NATRIURETIC PEPTIDE - Abnormal; Notable for the following:    Pro B Natriuretic peptide (BNP) 4083.0 (*)    All other components within normal limits  I-STAT CG4 LACTIC ACID, ED - Abnormal; Notable for the following:    Lactic Acid, Venous 7.14 (*)    All other components within normal limits  URINE CULTURE  CULTURE, BLOOD (ROUTINE X 2)  CULTURE, BLOOD (ROUTINE X 2)  TROPONIN I  CBC WITH DIFFERENTIAL  URINALYSIS, ROUTINE W REFLEX MICROSCOPIC  PROTIME-INR  CBC WITH DIFFERENTIAL  PROCALCITONIN    Imaging Review Dg Chest 2 View  05/08/2014   CLINICAL DATA:  80 year old male who fell this morning with shortness of breath and left hip pain. Initial encounter.  EXAM: CHEST  2 VIEW  COMPARISON:  04/22/2014 and earlier.  FINDINGS: Seated upright AP and lateral views of the chest. The patient arms are not raised. Stable cardiomegaly and mediastinal contours. No pneumothorax or pulmonary edema. Mildly increased elevation of the left hemidiaphragm and streaky left lung base opacity. No other acute pulmonary opacity. Osteopenia. Chronic deformity of the proximal left humerus. Calcified aortic atherosclerosis.   IMPRESSION: Chronic cardiomegaly with mildly increased left lung base opacity, favor atelectasis.   Electronically Signed   By: Augusto Gamble M.D.   On: 05/08/2014 10:20   Dg Hip Complete Left  05/08/2014   CLINICAL DATA:  80 year old male with history of trauma from a fall this morning complaining of anterior left hip pain.  EXAM: LEFT HIP - COMPLETE 2+ VIEW  COMPARISON:  No priors.  FINDINGS: AP view of the pelvis and AP and lateral views of the left hip demonstrate no acute displaced fracture of the bony pelvic ring. Bilateral proximal femurs as visualized appear intact, in the left femoral head is properly located. Joint space narrowing, subchondral sclerosis, subchondral cyst formation and osteophyte formation is  noted in the hip joints bilaterally, compatible with moderate osteoarthritis. Numerous vascular calcifications are noted.  IMPRESSION: 1. No acute radiographic abnormality of the bony pelvis or left hip. 2. Moderate bilateral hip joint osteoarthritis. 3. Atherosclerosis.   Electronically Signed   By: Trudie Reed M.D.   On: 05/08/2014 10:21   Ct Head Wo Contrast  05/08/2014   CLINICAL DATA:  80 year old male who fell with head injury last night. Gait and balance problems. Shortness of Breath. Initial encounter.  EXAM: CT HEAD WITHOUT CONTRAST  TECHNIQUE: Contiguous axial images were obtained from the base of the skull through the vertex without intravenous contrast.  COMPARISON:  Head CT without contrast 10/04/2010.  FINDINGS: No acute orbits soft tissue findings. Minimal to Mild scalp hematoma or contusion at the posterior vertex. Other scalp soft tissues within normal limits. Calvarium intact.  Chronic polypoid opacity in the left sphenoid sinus, partially calcified and increased since 2012. Other Visualized paranasal sinuses and mastoids are clear.  Interval age congruent cerebral volume loss. Chronic dural calcifications. No ventriculomegaly. No midline shift, mass effect, or evidence of  intracranial mass lesion. Small chronic lacunar infarct in the right cerebellum is new. Patchy increased bilateral periventricular white matter hypodensity. No evidence of cortically based acute infarction identified. No acute intracranial hemorrhage identified. No suspicious intracranial vascular hyperdensity.  IMPRESSION: 1. Mild posterior scalp soft tissue injury without underlying fracture. 2. No acute intracranial abnormality. Mild progression of nonspecific white matter changes and cerebellar small vessel ischemia since 2012.   Electronically Signed   By: Augusto Gamble M.D.   On: 05/08/2014 10:14     EKG Interpretation   Date/Time:  Wednesday May 08 2014 09:27:04 EST Ventricular Rate:  106 PR Interval:  207 QRS Duration: 116 QT Interval:  369 QTC Calculation: 490 R Axis:   -23 Text Interpretation:  Sinus tachycardia Borderline prolonged PR interval  Incomplete left bundle branch block Probable left ventricular hypertrophy  Anterior Q waves, possibly due to LVH Confirmed by Thurza Kwiecinski  MD, Ennis Heavner  ([id number]) on 05/08/2014 10:51:01 AM      MDM   Final diagnoses:  Dyspnea  Acute renal failure, unspecified acute renal failure type  General weakness  Lactic acidosis  SIRS (systemic inflammatory response syndrome)   Patient with recent hospitalization, could medical history presents tachycardic with lactate of 7. Clinically possible hospital acquired pneumonia with rales on lung exam and nonspecific findings on chest x-ray which was reviewed. Bedside ultrasound did not show significant B lines, patient feels thirsty and with lactate elevated 500 mL of fluid ordered, patient has cardiac history thus will do 500 mL at a time assuming blood pressure maintains above 100. Brought antibiotics ordered for possible sepsis, afebrile. CT head result reviewed, no acute findings. Concern for early sepsis with Sirs criteria and lactate of 7, broad antibiotics given for possible pneumonia, urinalysis  pending. Telemetry admission placed Paged hospitalist for admission.  The patients results and plan were reviewed and discussed.   Any x-rays performed were personally reviewed by myself.   Differential diagnosis were considered with the presenting HPI.  Medications  piperacillin-tazobactam (ZOSYN) IVPB 3.375 g (not administered)  vancomycin (VANCOCIN) 1,500 mg in sodium chloride 0.9 % 500 mL IVPB (not administered)  sodium chloride 0.9 % bolus 500 mL (not administered)    Filed Vitals:   05/08/14 0916 05/08/14 0925  BP: 107/81   Pulse: 108   Temp:  97.7 F (36.5 C)  TempSrc:  Rectal  Resp: 22   SpO2: 97%  Final diagnoses:  Dyspnea  Acute renal failure, unspecified acute renal failure type  General weakness  Lactic acidosis  SIRS (systemic inflammatory response syndrome)    Admission/ observation were discussed with the admitting physician, patient and/or family and they are comfortable with the plan.     Enid Skeens, MD 05/08/14 [phone]

## 2014-05-08 NOTE — Progress Notes (Addendum)
ANTIBIOTIC CONSULT NOTE - Initial Consult  Pharmacy Consult: Vancomycin, Cefepime, and Valtrex Indication: rule out sepsis with recent HCAP and shingles  Allergies  Allergen Reactions  . Iohexol Itching and Rash         Patient Measurements:    Actual Body Weight 76.6 kg  Vital Signs: Temp: 97.8 F (36.6 C) (12/16 1356) Temp Source: Oral (12/16 1356) BP: 99/73 mmHg (12/16 1600) Pulse Rate: 102 (12/16 1600)  Labs:  Recent Labs  05/08/14 0940 05/08/14 1100 05/08/14 1209  HGB  --  12.7*  --   HCT  --  37.1*  --   PLT  --  125*  --   LABPROT  --   --  25.8*  INR  --   --  2.34*  CREATININE 1.75*  --   --   TROPONINI <0.30  --   --     Estimated Creatinine Clearance: 27.5 mL/min (by C-G formula based on Cr of 1.75).     Assessment: 80 y/o male  Presents with generalized weakness and left hip pain. Pharmacy is consulted to dose vancomycin and cefepime for rule out sepsis with recent HCAP hospitalization. Pt had been on vancomycin and cefepime during last hospitalization in past 3 weeks. Pt is afebrile, WBC wnl, sCr 1.75 with CrCl ~ 78mL/min.  Patient received vancomycin 1500mg  IV and Zosyn 3.375g IV one time 12/16 at 1120. Will dose from that time.  Pharmacy is consulted to dose valtrex for herpes zoster. Will renally adjust valtrex.  Goal of Therapy:  Vanc trough: 15-20 mcg/mL  Plan:  - Vancomycin 750mg  IV q24h - Cefepime 1g IV q24h - Valtrex 1g PO q24h - Check VT as steady state - Monitor renal function and clinical progress  1/17. , PharmD Clinical Pharmacist Pager [phone]  05/08/2014, 4:29 PM

## 2014-05-08 NOTE — Progress Notes (Signed)
Pt admitted to room 18. No resp distress. Oriented to unit, call bell in reach

## 2014-05-08 NOTE — ED Notes (Signed)
Lactic acid results given to Dr. Jodi Mourning

## 2014-05-09 DIAGNOSIS — E860 Dehydration: Secondary | ICD-10-CM

## 2014-05-09 DIAGNOSIS — I5022 Chronic systolic (congestive) heart failure: Secondary | ICD-10-CM

## 2014-05-09 DIAGNOSIS — R5381 Other malaise: Secondary | ICD-10-CM

## 2014-05-09 LAB — CBC
HCT: 36 % — ABNORMAL LOW (ref 39.0–52.0)
Hemoglobin: 12.4 g/dL — ABNORMAL LOW (ref 13.0–17.0)
MCH: 28.7 pg (ref 26.0–34.0)
MCHC: 34.4 g/dL (ref 30.0–36.0)
MCV: 83.3 fL (ref 78.0–100.0)
Platelets: 130 10*3/uL — ABNORMAL LOW (ref 150–400)
RBC: 4.32 MIL/uL (ref 4.22–5.81)
RDW: 19.5 % — AB (ref 11.5–15.5)
WBC: 7.7 10*3/uL (ref 4.0–10.5)

## 2014-05-09 LAB — BASIC METABOLIC PANEL
Anion gap: 14 (ref 5–15)
BUN: 39 mg/dL — AB (ref 6–23)
CALCIUM: 8.5 mg/dL (ref 8.4–10.5)
CO2: 27 mEq/L (ref 19–32)
Chloride: 98 mEq/L (ref 96–112)
Creatinine, Ser: 1.69 mg/dL — ABNORMAL HIGH (ref 0.50–1.35)
GFR calc Af Amer: 39 mL/min — ABNORMAL LOW (ref >90)
GFR, EST NON AFRICAN AMERICAN: 34 mL/min — AB (ref >90)
Glucose, Bld: 92 mg/dL (ref 70–99)
POTASSIUM: 3.7 meq/L (ref 3.7–5.3)
Sodium: 139 mEq/L (ref 137–147)

## 2014-05-09 LAB — URINE CULTURE

## 2014-05-09 LAB — PROTIME-INR
INR: 2.71 — ABNORMAL HIGH (ref 0.00–1.49)
PROTHROMBIN TIME: 29 s — AB (ref 11.6–15.2)

## 2014-05-09 LAB — LACTIC ACID, PLASMA: LACTIC ACID, VENOUS: 3.6 mmol/L — AB (ref 0.5–2.2)

## 2014-05-09 MED ORDER — ENSURE COMPLETE PO LIQD
237.0000 mL | Freq: Two times a day (BID) | ORAL | Status: DC
  Administered 2014-05-09 – 2014-05-13 (×5): 237 mL via ORAL

## 2014-05-09 NOTE — Care Management Note (Addendum)
    Page 1 of 2   05/13/2014     4:27:48 PM CARE MANAGEMENT NOTE 05/13/2014  Patient:  [name] [name], [name] [name]   Account Number:  [id number]  Date Initiated:  05/09/2014  Documentation initiated by:  Donato Schultz  Subjective/Objective Assessment:   SIRS, Lactic Acidosis w/dehydration r/t possible over diuresis w/ acute kidney injury, fall with Left hip pain, shingles     Action/Plan:   CM to follow for disposition needs   Anticipated DC Date:  05/14/2014   Anticipated DC Plan:  SKILLED NURSING FACILITY  In-house referral  Clinical Social Worker      DC Planning Services  CM consult      Choice offered to / List presented to:  NA        HH arranged  HH-1 RN  HH-10 DISEASE MANAGEMENT  HH-2 PT  HH-3 OT  HH-4 NURSE'S AIDE  HH-6 SOCIAL WORKER      HH agency  [facility]   Status of service:  Completed, signed off Medicare Important Message given?  YES (If response is "NO", the following Medicare IM given date fields will be blank) Date Medicare IM given:  05/10/2014 Medicare IM given by:  HUTCHINSON,CRYSTAL Date Additional Medicare IM given:  05/13/2014 Additional Medicare IM given by:  Laymon Stockert  Discharge Disposition:  SKILLED NURSING FACILITY  Per UR Regulation:  Reviewed for med. necessity/level of care/duration of stay  If discussed at Long Length of Stay Meetings, dates discussed:    Comments:  05/13/14 Sidney Ace, RN, BSN [phone] Family now agreeable to SNF.  They want [facility]...plan to dc to SNF per CSW arrangements, pending insurance authorization.  Crystal Hutchinson RN, BSN, MSHL, CCM  Nurse - Case Manager,  (Unit [phone]  05/10/2014 Per SW update:  family refuses SNF and elects to go home with [facility].  States [facility] already active in the home caring for patient's wife. Dispo Plan:  Home with HHS;  RN, PT, OT, HHA, SW [facility] Lupita Leash notified)   Donato Schultz RN, BSN, MSHL, CCM  Nurse - Case Manager,  ([facility])   [phone]  05/09/2014 Hx/o 3 admissions over the past 3 months.  D/c 04/25/14 with [facility] [place] RN, PT, OT, HHA services Social:  patient and family/son and grand DTR and agreeable to SNF if recommended this admission PT/OT RECS:  SNF Disposition Plan:  SNF

## 2014-05-09 NOTE — Evaluation (Signed)
Physical Therapy Evaluation Patient Details Name: [name] [name] MRN: [id number] DOB: May 15, 1921 Today's Date: 05/09/2014   History of Present Illness  [name] [name]  is a 80 y.o. male, with known past medical history of pulmonary hypertension,Donna kidney disease, chronic kidney disease, rheumatoid arthritis, gout, DVT on anticoagulation, started in August of [facility] secondary to acute CHF exacerbation, recently admitted to [facility] w/ shortness of breath, cough, productive sputum, his x-ray showing evidence of pneumonia, He is currently admitted due to weakness, fatigue and falls w/ dx: SIRS. He is on airborne and contact precautions.  Clinical Impression  Pt admitted with above diagnosis. Pt currently with functional limitations due to the deficits listed below (see PT Problem List).  Pt will benefit from skilled PT to increase their independence and safety with mobility to allow discharge to the venue listed below. Pt with significant decline in mobility since last admission. Do not feel pt can manage at home without 24 hour assist. Feel he will need ST-SNF if this can't be arranged.      Follow Up Recommendations SNF    Equipment Recommendations  None recommended by PT    Recommendations for Other Services       Precautions / Restrictions Precautions Precautions: Fall      Mobility  Bed Mobility Overal bed mobility: Needs Assistance Bed Mobility: Supine to Sit;Sit to Supine     Supine to sit: Mod assist Sit to supine: Mod assist   General bed mobility comments: Assist to bring trunk up and to bring legs off to sit. Assist to bring legs back up into bed.  Transfers Overall transfer level: Needs assistance Equipment used: None (back of chair.) Transfers: Sit to/from Stand Sit to Stand: Max assist;From elevated surface         General transfer comment: Pt used back of chair to hold onto when partially in standing. Posterior of pt's thighs and calves leaning  against bed for support.   Ambulation/Gait                Stairs            Wheelchair Mobility    Modified Rankin (Stroke Patients Only)       Balance Overall balance assessment: Needs assistance Sitting-balance support: Bilateral upper extremity supported Sitting balance-Leahy Scale: Poor Sitting balance - Comments: Requires upper extremity support for balance.                                     Pertinent Vitals/Pain      Home Living Family/patient expects to be discharged to:: Private residence Living Arrangements: Spouse/significant other;Children Available Help at Discharge: Family;Available PRN/intermittently Type of Home: House Home Access: Stairs to enter Entrance Stairs-Rails: None Entrance Stairs-Number of Steps: 3 Home Layout: One level Home Equipment: Walker - 2 wheels Additional Comments: Pt has daughter and son that lives with him and wife. They both work and are available intermitterntly. Pt is primary caregiver for wife who has advanced Alzheimer's.     Prior Function Level of Independence: Independent with assistive device(s)         Comments: Has started using RW.  Not driving at this time.  Has been primary caregiver to his wife.  Son and Daughter assisting     Hand Dominance   Dominant Hand: Right    Extremity/Trunk Assessment   Upper Extremity Assessment: Defer to OT evaluation  Lower Extremity Assessment: RLE deficits/detail;LLE deficits/detail RLE Deficits / Details: grossly <3/5 LLE Deficits / Details: grossly <3/5     Communication   Communication: HOH  Cognition Arousal/Alertness: Awake/alert Behavior During Therapy: WFL for tasks assessed/performed Overall Cognitive Status: Impaired/Different from baseline Area of Impairment: Awareness;Safety/judgement     Memory: Decreased short-term memory   Safety/Judgement: Decreased awareness of safety;Decreased awareness of deficits      General Comments: Pt thought he could go into the bathroom even after he wasn't able to fully stand without leaning heavily on the bed.    General Comments      Exercises        Assessment/Plan    PT Assessment Patient needs continued PT services  PT Diagnosis Difficulty walking;Generalized weakness   PT Problem List Decreased strength;Decreased activity tolerance;Decreased balance;Decreased mobility;Decreased knowledge of use of DME;Cardiopulmonary status limiting activity  PT Treatment Interventions DME instruction;Gait training;Functional mobility training;Therapeutic activities;Balance training;Patient/family education;Therapeutic exercise   PT Goals (Current goals can be found in the Care Plan section) Acute Rehab PT Goals Patient Stated Goal: Return home PT Goal Formulation: With patient Time For Goal Achievement: 05/23/14 Potential to Achieve Goals: Good    Frequency Min 3X/week   Barriers to discharge Decreased caregiver support wife with alzheimers and children work    Co-evaluation               End of Session Equipment Utilized During Treatment: Gait belt Activity Tolerance: Patient tolerated treatment well Patient left: in bed;with call bell/phone within reach;with bed alarm set Nurse Communication: Mobility status         Time: 1511-1531 PT Time Calculation (min) (ACUTE ONLY): 20 min   Charges:   PT Evaluation $Initial PT Evaluation Tier I: 1 Procedure PT Treatments $Therapeutic Activity: 8-22 mins   PT G Codes:          Ulysses Alper 05-22-14, 5:26 PM  [facility] PT [phone]

## 2014-05-09 NOTE — Progress Notes (Signed)
ANTICOAGULATION CONSULT NOTE - Follow Up Consult  Pharmacy Consult for warfarin Indication: hx DVT  Allergies  Allergen Reactions  . Iohexol Itching and Rash         Patient Measurements: Height: 5\' 9"  (175.3 cm) Weight: 166 lb 7.2 oz (75.5 kg) IBW/kg (Calculated) : 70.7  Vital Signs: Temp: 97.8 F (36.6 C) (12/17 0628) Temp Source: Oral (12/17 0628) BP: 101/70 mmHg (12/17 0628) Pulse Rate: 53 (12/17 0628)  Labs:  Recent Labs  05/08/14 0940 05/08/14 1100 05/08/14 1209 05/09/14 0639  HGB  --  12.7*  --  12.4*  HCT  --  37.1*  --  36.0*  PLT  --  125*  --  130*  LABPROT  --   --  25.8* 29.0*  INR  --   --  2.34* 2.71*  CREATININE 1.75*  --   --  1.69*  TROPONINI <0.30  --   --   --     Estimated Creatinine Clearance: 28.5 mL/min (by C-G formula based on Cr of 1.69).   Assessment: 80 y/o male on chronic warfarin for hx DVT. INR is therapeutic at 2.71. No bleeding noted, CBC is stable today.   Goal of Therapy:  INR 2-3 Monitor platelets by anticoagulation protocol: Yes   Plan:  - Warfarin 2.5 mg PO tonight - INR daily - Monitor for s/sx of bleeding  [facility], [facility].D., BCPS Clinical Pharmacist Pager: [phone] 05/09/2014 10:55 AM

## 2014-05-09 NOTE — Progress Notes (Signed)
INITIAL NUTRITION ASSESSMENT  DOCUMENTATION CODES Per approved criteria  -Not Applicable   INTERVENTION: Provide Ensure Complete BID, each supplement provides 350 kcal and 13 grams of protein  NUTRITION DIAGNOSIS: Inadequate oral intake related to decreased appetite as evidenced by >7% weight loss in less than one month.   Goal: Pt to meet >/= 90% of their estimated nutrition needs   Monitor:  PO intake, weight trend, labs, I/O's  Reason for Assessment: Malnutrition Screening Tool, score of 3  80 y.o. male  Admitting Dx: SIRS (systemic inflammatory response syndrome)  ASSESSMENT: 80 year old male with history of chronic systolic CHF, DVT on Coumadin, CKD stage III, anemia, peripheral vascular disease, hypertension, rheumatoid arthritis who presented to ED after a fall last night.  Patient working with PT at time of visit. Per Malnutrition Screening Tool pt reported that he has been eating poorly due to a decreased appetite but, he is unsure if he has lost weight. Per weight history, pt has lost 14 lbs within the past month- 7.8% weight loss. Per nursing notes, pt ate 50% of lunch and 75% of breakfast. Per MD note, may d/c pt tomorrow. Labs reviewed.   Height: Ht Readings from Last 1 Encounters:  05/08/14 5\' 9"  (1.753 m)    Weight: Wt Readings from Last 1 Encounters:  05/09/14 166 lb 7.2 oz (75.5 kg)    Ideal Body Weight: 160 lbs  % Ideal Body Weight: 104%  Wt Readings from Last 10 Encounters:  05/09/14 166 lb 7.2 oz (75.5 kg)  05/07/14 168 lb 12.8 oz (76.567 kg)  04/25/14 174 lb 9.6 oz (79.198 kg)  04/15/14 180 lb (81.647 kg)  02/25/14 190 lb (86.183 kg)  01/24/14 186 lb 12.8 oz (84.732 kg)  01/18/14 184 lb 3.2 oz (83.553 kg)  12/31/13 201 lb 1.9 oz (91.227 kg)  10/12/13 200 lb (90.719 kg)  07/03/13 185 lb (83.915 kg)    Usual Body Weight: 185-190 lbs (per weight history)  % Usual Body Weight: 90^  BMI:  Body mass index is 24.57 kg/(m^2).  Estimated  Nutritional Needs: Kcal: 1800-2000 Protein: 105-115 grams Fluid: 1.8-2 L/day  Skin: non-pitting RLE and LLE edema  Diet Order: Diet 2 gram sodium  EDUCATION NEEDS: -No education needs identified at this time   Intake/Output Summary (Last 24 hours) at 05/09/14 1536 Last data filed at 05/09/14 1300  Gross per 24 hour  Intake    990 ml  Output   1175 ml  Net   -185 ml    Last BM: 12/15   Labs:   Recent Labs Lab 05/08/14 0940 05/09/14 0639  NA 138 139  K 4.2 3.7  CL 94* 98  CO2 23 27  BUN 43* 39*  CREATININE 1.75* 1.69*  CALCIUM 8.7 8.5  GLUCOSE 166* 92    CBG (last 3)   Recent Labs  05/08/14 2124  GLUCAP 177*    Scheduled Meds: . calcium-vitamin D  1 tablet Oral Daily  . ceFEPime (MAXIPIME) IV  1 g Intravenous Q24H  . dextromethorphan-guaiFENesin  1 tablet Oral BID  . sodium chloride  3 mL Intravenous Q12H  . valACYclovir  1,000 mg Oral Q24H  . vancomycin  750 mg Intravenous Q24H  . Warfarin - Pharmacist Dosing Inpatient   Does not apply q1800    Continuous Infusions:   Past Medical History  Diagnosis Date  . Hypertension   . Chronic kidney disease   . PAD (peripheral artery disease)   . Rheumatoid arthritis(714.0)   . CKD (  chronic kidney disease), stage III   . Anemia   . Gout   . COPD (chronic obstructive pulmonary disease)   . DVT, lower extremity   . Chronic anticoagulation     Past Surgical History  Procedure Laterality Date  . Hernia repair  1982    right  . Cataract extraction    . Amputation  05/02/2011    Procedure: AMPUTATION DIGIT;  Surgeon: Kathryne Hitch;  Location: [facility] OR;  Service: Orthopedics;  Laterality: Left;  left great toe  . Colonoscopy w/ biopsies and polypectomy      Hx: of  . Amputation Left 07/03/2013    Procedure: LEFT 2ND TOE AMPUTATION ;  Surgeon: Kathryne Hitch, MD;  Location: [facility] OR;  Service: Orthopedics;  Laterality: Left;  . Esophageal dilation      Ian Malkin RD, LDN Inpatient  Clinical Dietitian Pager: [phone] After Hours Pager: [phone]

## 2014-05-09 NOTE — Progress Notes (Signed)
Patient ID: [name] [name]  male  QIO:[id number]    DOB: 1921-05-10    DOA: 05/08/2014  PCP: [name], MD  Assessment/Plan: Principal Problem:   SIRS (systemic inflammatory response syndrome) - Symptoms improving, afebrile, no leukocytosis, pro-calcitonin less than 0.1, blood cultures negative so far, UA negative - Chest x-ray showed chronic cardiomegaly with mildly increased left lung base opacity favor atelectasis - Continue IV antibiotics for today, if cultures remain negative, remains afebrile, will DC antibiotics   Active Problems:   HTN (hypertension) - Currently stable, will possibly restart Lasix tomorrow    Debility with falls -PTOT evaluation pending     Chronic systolic heart failure: Currently euvolemic, will restart Lasix tomorrow    Shingles rash - Continue Valtrex    Lactic acidosis with Dehydration -  repeat lactic acidosis,  History of DVT : On Coumadin   DVT Prophylaxis:On Coumadin  Code Status:Full code  Family Communication:  Disposition:Awaiting PT evaluation, may DC tomorrow  Consultants: None  Procedures: None  Antibiotics:  IV vancomycin  Cefepime  Subjective: Patient seen and examined, feeling better this morning, sitting up in the chair, no acute issues overnight  Objective: Weight change:   Intake/Output Summary (Last 24 hours) at 05/09/14 1122 Last data filed at 05/09/14 0509  Gross per 24 hour  Intake    530 ml  Output    725 ml  Net   -195 ml   Blood pressure 101/70, pulse 53, temperature 97.8 F (36.6 C), temperature source Oral, resp. rate 20, height 5\' 9"  (1.753 m), weight 75.5 kg (166 lb 7.2 oz), SpO2 98 %.  Physical Exam: General: Alert and awake, oriented x3, not in any acute distress. CVS: S1-S2 clear, no murmur rubs or gallops Chest: clear to auscultation bilaterally, no wheezing, rales or rhonchi Abdomen: soft nontender, nondistended, normal bowel sounds  Extremities: no cyanosis, clubbing or edema  noted bilaterally Neuro: Cranial nerves II-XII intact, no focal neurological deficits  Lab Results: Basic Metabolic Panel:  Recent Labs Lab 05/08/14 0940 05/09/14 0639  NA 138 139  K 4.2 3.7  CL 94* 98  CO2 23 27  GLUCOSE 166* 92  BUN 43* 39*  CREATININE 1.75* 1.69*  CALCIUM 8.7 8.5   Liver Function Tests: No results for input(s): AST, ALT, ALKPHOS, BILITOT, PROT, ALBUMIN in the last 168 hours. No results for input(s): LIPASE, AMYLASE in the last 168 hours. No results for input(s): AMMONIA in the last 168 hours. CBC:  Recent Labs Lab 05/08/14 1100 05/09/14 0639  WBC 8.4 7.7  NEUTROABS 7.3  --   HGB 12.7* 12.4*  HCT 37.1* 36.0*  MCV 86.1 83.3  PLT 125* 130*   Cardiac Enzymes:  Recent Labs Lab 05/08/14 0940  TROPONINI <0.30   BNP: Invalid input(s): POCBNP CBG:  Recent Labs Lab 05/08/14 2124  GLUCAP 177*     Micro Results: Recent Results (from the past 240 hour(s))  Blood culture (routine x 2)     Status: None (Preliminary result)   Collection Time: 05/08/14 11:00 AM  Result Value Ref Range Status   Specimen Description BLOOD RIGHT HAND  Final   Special Requests BOTTLES DRAWN AEROBIC AND ANAEROBIC 5CC  Final   Culture  Setup Time   Final    05/08/2014 14:31 Performed at 05/10/2014    Culture   Final           BLOOD CULTURE RECEIVED NO GROWTH TO DATE CULTURE WILL BE HELD FOR 5 DAYS BEFORE ISSUING  A FINAL NEGATIVE REPORT Performed at [facility]    Report Status PENDING  Incomplete  Blood culture (routine x 2)     Status: None (Preliminary result)   Collection Time: 05/08/14 11:05 AM  Result Value Ref Range Status   Specimen Description BLOOD RIGHT ANTECUBITAL  Final   Special Requests BOTTLES DRAWN AEROBIC AND ANAEROBIC  Final   Culture  Setup Time   Final    05/08/2014 17:12 Performed at [facility]    Culture   Final           BLOOD CULTURE RECEIVED NO GROWTH TO DATE CULTURE WILL BE HELD FOR 5 DAYS BEFORE  ISSUING A FINAL NEGATIVE REPORT Performed at [facility]    Report Status PENDING  Incomplete    Studies/Results: Dg Chest 2 View  05/08/2014   CLINICAL DATA:  80 year old male who fell this morning with shortness of breath and left hip pain. Initial encounter.  EXAM: CHEST  2 VIEW  COMPARISON:  04/22/2014 and earlier.  FINDINGS: Seated upright AP and lateral views of the chest. The patient arms are not raised. Stable cardiomegaly and mediastinal contours. No pneumothorax or pulmonary edema. Mildly increased elevation of the left hemidiaphragm and streaky left lung base opacity. No other acute pulmonary opacity. Osteopenia. Chronic deformity of the proximal left humerus. Calcified aortic atherosclerosis.  IMPRESSION: Chronic cardiomegaly with mildly increased left lung base opacity, favor atelectasis.   Electronically Signed   By: Augusto Gamble M.D.   On: 05/08/2014 10:20   Dg Chest 2 View  04/20/2014   CLINICAL DATA:  Shortness of breath, cough  EXAM: CHEST  2 VIEW  COMPARISON:  01/12/2014  FINDINGS: Patchy left lower lobe opacity, suspicious for pneumonia.  Underlying chronic interstitial markings. No pleural effusion or pneumothorax.  The heart is normal size.  Degenerative changes of the visualized thoracolumbar spine.  IMPRESSION: Patchy left lower lobe opacity, suspicious for pneumonia.   Electronically Signed   By: Charline Bills M.D.   On: 04/20/2014 15:27   Dg Hip Complete Left  05/08/2014   CLINICAL DATA:  80 year old male with history of trauma from a fall this morning complaining of anterior left hip pain.  EXAM: LEFT HIP - COMPLETE 2+ VIEW  COMPARISON:  No priors.  FINDINGS: AP view of the pelvis and AP and lateral views of the left hip demonstrate no acute displaced fracture of the bony pelvic ring. Bilateral proximal femurs as visualized appear intact, in the left femoral head is properly located. Joint space narrowing, subchondral sclerosis, subchondral cyst formation and  osteophyte formation is noted in the hip joints bilaterally, compatible with moderate osteoarthritis. Numerous vascular calcifications are noted.  IMPRESSION: 1. No acute radiographic abnormality of the bony pelvis or left hip. 2. Moderate bilateral hip joint osteoarthritis. 3. Atherosclerosis.   Electronically Signed   By: Trudie Reed M.D.   On: 05/08/2014 10:21   Ct Head Wo Contrast  05/08/2014   CLINICAL DATA:  80 year old male who fell with head injury last night. Gait and balance problems. Shortness of Breath. Initial encounter.  EXAM: CT HEAD WITHOUT CONTRAST  TECHNIQUE: Contiguous axial images were obtained from the base of the skull through the vertex without intravenous contrast.  COMPARISON:  Head CT without contrast 10/04/2010.  FINDINGS: No acute orbits soft tissue findings. Minimal to Mild scalp hematoma or contusion at the posterior vertex. Other scalp soft tissues within normal limits. Calvarium intact.  Chronic polypoid opacity in the left sphenoid  sinus, partially calcified and increased since 2012. Other Visualized paranasal sinuses and mastoids are clear.  Interval age congruent cerebral volume loss. Chronic dural calcifications. No ventriculomegaly. No midline shift, mass effect, or evidence of intracranial mass lesion. Small chronic lacunar infarct in the right cerebellum is new. Patchy increased bilateral periventricular white matter hypodensity. No evidence of cortically based acute infarction identified. No acute intracranial hemorrhage identified. No suspicious intracranial vascular hyperdensity.  IMPRESSION: 1. Mild posterior scalp soft tissue injury without underlying fracture. 2. No acute intracranial abnormality. Mild progression of nonspecific white matter changes and cerebellar small vessel ischemia since 2012.   Electronically Signed   By: Augusto Gamble M.D.   On: 05/08/2014 10:14   Dg Chest Port 1 View  04/22/2014   CLINICAL DATA:  Acute onset of shortness of breath.  Subsequent study.  EXAM: PORTABLE CHEST - 1 VIEW  COMPARISON:  Chest radiograph performed 04/20/2014  FINDINGS: The lungs are well-aerated. Bibasilar airspace opacities are noted, with associated vascular congestion and small bilateral pleural effusions. This is concerning for pulmonary edema. No pneumothorax is seen.  The cardiomediastinal silhouette is borderline normal in size. No acute osseous abnormalities are seen. Chronic deformity is noted at the proximal left humerus.  IMPRESSION: Bibasilar airspace opacities, with associated vascular congestion and small bilateral pleural effusions, concerning for pulmonary edema.   Electronically Signed   By: Roanna Raider M.D.   On: 04/22/2014 03:28    Medications: Scheduled Meds: . calcium-vitamin D  1 tablet Oral Daily  . ceFEPime (MAXIPIME) IV  1 g Intravenous Q24H  . dextromethorphan-guaiFENesin  1 tablet Oral BID  . sodium chloride  3 mL Intravenous Q12H  . valACYclovir  1,000 mg Oral Q24H  . vancomycin  750 mg Intravenous Q24H  . Warfarin - Pharmacist Dosing Inpatient   Does not apply q1800      LOS: 1 day   Verlon Carcione M.D. [facility] 05/09/2014, 11:22 AM Pager: [phone]  If 7PM-7AM, please contact night-coverage [email or web address] Password [email or web address]

## 2014-05-09 NOTE — Care Management Utilization Note (Signed)
UR Completed.    Roselind Klus Wise Kahliya Fraleigh, RN, BSN Phone #[phone] 

## 2014-05-09 NOTE — Evaluation (Signed)
Occupational Therapy Evaluation Patient Details Name: [name] [name] MRN: [id number] DOB: Apr 28, 1921 Today's Date: 05/09/2014    History of Present Illness [name] [name]  is a 80 y.o. male, with known past medical history of pulmonary hypertension,Donna kidney disease, chronic kidney disease, rheumatoid arthritis, gout, DVT on anticoagulation, started in August of [facility] secondary to acute CHF exacerbation, recently admitted to [facility] w/ shortness of breath, cough, productive sputum, his x-ray showing evidence of pneumonia, He is currently admitted due to weakness, fatigue and falls w/ dx: SIRS. He is on airborne and contact precautions.   Clinical Impression   Pt currently requiring increased assistance w/ all ADL's and functional activities, ranging from set-up to +2 physical assist. Pt has h/o falls and was admitted yesterday s/p fall at home & dx as per above. Pt will benefit from acute OT for ADL retraining and to address problems as identified below, rec SNF at d/ci f pt/family is agreeable.    Follow Up Recommendations  SNF;Supervision/Assistance - 24 hour (Pt will require 24/7 assist and HHOT if he goes home)    Equipment Recommendations  None recommended by OT;Other (comment) (Defer to next venue)    Recommendations for Other Services       Precautions / Restrictions Precautions Precautions: Fall Restrictions Weight Bearing Restrictions: No      Mobility Bed Mobility Overal bed mobility: Needs Assistance Bed Mobility: Supine to Sit;Sit to Supine     Supine to sit: Mod assist (Increased time due to bilateral hip pain) Sit to supine: Max assist (Increased time, assist with pad as pt unable to scoot)   General bed mobility comments: Pt requiring increased assistance naad increased time for all mobility noted. Unable to scoot back into bed, assisted w/ bed positioning and pad.  Transfers Overall transfer level: Needs assistance Equipment used: 2 person hand  held assist Transfers: Sit to/from Stand Sit to Stand: +2 physical assistance;From elevated surface         General transfer comment: Incr time    Balance Overall balance assessment: Needs assistance Sitting-balance support: Bilateral upper extremity supported;Feet supported Sitting balance-Leahy Scale: Good     Standing balance support:  (Attempted standing x2 from elevated surface, pt unable)                                ADL Overall ADL's : Needs assistance/impaired     Grooming: Set up;Supervision/safety;Sitting;Bed level   Upper Body Bathing: Supervision/ safety;Set up;Sitting   Lower Body Bathing: Sit to/from stand;Maximal assistance Lower Body Bathing Details (indicate cue type and reason): lmited by endurance Upper Body Dressing : Set up;Supervision/safety;Sitting   Lower Body Dressing: Maximal assistance;Sit to/from stand   Toilet Transfer: +2 for physical assistance;BSC;RW   Toileting- Clothing Manipulation and Hygiene: +2 for physical assistance;Sit to/from stand       Functional mobility during ADLs: +2 for physical assistance;Rolling walker;+2 for safety/equipment General ADL Comments: Pt currently requiring increased assistance w/ all ADL's and functional activities, ranging from set-up to +2 physical assist. Pt has h/o falls and was admitted yesterday s/p fall at home. Pt will require 24/7 assist at d/c ?SNF if pt/family is agreeable. Educated in role of OT and began education on activitiy tolerance and energy conservation.     Vision                     Perception     Praxis  Pertinent Vitals/Pain Pain Assessment: 0-10 Pain Score: 7  Pain Location: Hips Pain Descriptors / Indicators: Aching Pain Intervention(s): Limited activity within patient's tolerance;Monitored during session;Repositioned     Hand Dominance Right   Extremity/Trunk Assessment Upper Extremity Assessment Upper Extremity Assessment: Generalized  weakness;LUE deficits/detail LUE Deficits / Details: rotator cuff deficits - limited shoulder flex to @ 30 degrees   Lower Extremity Assessment Lower Extremity Assessment: Defer to PT evaluation   Cervical / Trunk Assessment Cervical / Trunk Assessment: Normal   Communication Communication Communication: HOH   Cognition Arousal/Alertness: Awake/alert Behavior During Therapy: WFL for tasks assessed/performed Overall Cognitive Status: Within Functional Limits for tasks assessed                     General Comments       Exercises       Shoulder Instructions      Home Living Family/patient expects to be discharged to:: Private residence Living Arrangements: Spouse/significant other;Children Available Help at Discharge: Family;Available PRN/intermittently Type of Home: House Home Access: Stairs to enter [employer] of Steps: 3 Entrance Stairs-Rails: None Home Layout: One level     Bathroom Shower/Tub: Tub/shower unit Shower/tub characteristics: Door Firefighter: Standard Bathroom Accessibility: Yes How Accessible: Accessible via walker Home Equipment:  (handicapped stool over toilet)   Additional Comments: Pt has daughter and son that lives with him and wife. They both work and are available intermitterntly. Pt is primary caregiver for wife who has advanced Alzheimer's.       Prior Functioning/Environment Level of Independence: Independent with assistive device(s)        Comments: Has started using RW.  Not driving at this time.  Has been primary caregiver to his wife.  Son and Daughter assisting    OT Diagnosis: Generalized weakness;Acute pain   OT Problem List: Decreased strength;Decreased activity tolerance;Cardiopulmonary status limiting activity;Decreased knowledge of use of DME or AE;Pain;Impaired UE functional use;Decreased range of motion   OT Treatment/Interventions: Self-care/ADL training;Therapeutic exercise;DME and/or AE  instruction;Therapeutic activities;Patient/family education;Energy conservation;Balance training    OT Goals(Current goals can be found in the care plan section) Acute Rehab OT Goals Patient Stated Goal: Decreased pain in hips, get well OT Goal Formulation: With patient Time For Goal Achievement: 05/23/14 Potential to Achieve Goals: Fair  OT Frequency: Min 2X/week   Barriers to D/C:            Co-evaluation              End of Session Equipment Utilized During Treatment: Gait belt  Activity Tolerance: Patient limited by pain;Patient limited by fatigue Patient left: in bed;with call bell/phone within reach   Time: 1121-1144 OT Time Calculation (min): 23 min Charges:  OT General Charges $OT Visit: 1 Procedure OT Evaluation $Initial OT Evaluation Tier I: 1 Procedure OT Treatments $Therapeutic Activity: 8-22 mins G-Codes:    Alm Bustard, OT 05/09/2014, 12:15 PM

## 2014-05-09 NOTE — Progress Notes (Signed)
Pharmacist Heart Failure Core Measure Documentation  Assessment: [name] [name] has an EF documented as 30-35% on 01/01/14 by echo.  Rationale: Heart failure patients with left ventricular systolic dysfunction (LVSD) and an EF < 40% should be prescribed an angiotensin converting enzyme inhibitor (ACEI) or angiotensin receptor blocker (ARB) at discharge unless a contraindication is documented in the medical record.  This patient is not currently on an ACEI or ARB for HF.  This note is being placed in the record in order to provide documentation that a contraindication to the use of these agents is present for this encounter.  ACE Inhibitor or Angiotensin Receptor Blocker is contraindicated (specify all that apply)  []   ACEI allergy AND ARB allergy []   Angioedema []   Moderate or severe aortic stenosis []   Hyperkalemia []   Hypotension []   Renal artery stenosis [x]   Worsening renal function, preexisting renal disease or dysfunction   [facility], .D., BCPS Clinical Pharmacist Pager: [phone] 05/09/2014 2:54 PM

## 2014-05-10 ENCOUNTER — Encounter (HOSPITAL_COMMUNITY): Payer: Self-pay | Admitting: *Deleted

## 2014-05-10 LAB — CBC
HEMATOCRIT: 35.8 % — AB (ref 39.0–52.0)
Hemoglobin: 12.1 g/dL — ABNORMAL LOW (ref 13.0–17.0)
MCH: 28.4 pg (ref 26.0–34.0)
MCHC: 33.8 g/dL (ref 30.0–36.0)
MCV: 84 fL (ref 78.0–100.0)
Platelets: 125 10*3/uL — ABNORMAL LOW (ref 150–400)
RBC: 4.26 MIL/uL (ref 4.22–5.81)
RDW: 19.7 % — AB (ref 11.5–15.5)
WBC: 7.9 10*3/uL (ref 4.0–10.5)

## 2014-05-10 LAB — PROTIME-INR
INR: 2.65 — ABNORMAL HIGH (ref 0.00–1.49)
Prothrombin Time: 28.5 seconds — ABNORMAL HIGH (ref 11.6–15.2)

## 2014-05-10 LAB — PROCALCITONIN: PROCALCITONIN: 0.11 ng/mL

## 2014-05-10 MED ORDER — SODIUM CHLORIDE 0.9 % IV BOLUS (SEPSIS)
500.0000 mL | Freq: Once | INTRAVENOUS | Status: AC
  Administered 2014-05-10: 500 mL via INTRAVENOUS

## 2014-05-10 MED ORDER — WARFARIN SODIUM 2.5 MG PO TABS
2.5000 mg | ORAL_TABLET | Freq: Every day | ORAL | Status: DC
  Administered 2014-05-10: 2.5 mg via ORAL
  Filled 2014-05-10 (×2): qty 1

## 2014-05-10 NOTE — Progress Notes (Signed)
ANTICOAGULATION CONSULT NOTE - Follow Up Consult  Pharmacy Consult for warfarin Indication: hx DVT  Allergies  Allergen Reactions  . Iohexol Itching and Rash         Patient Measurements: Height: 5\' 9"  (175.3 cm) Weight: 165 lb 2.1 oz (74.903 kg) IBW/kg (Calculated) : 70.7  Vital Signs: Temp: 99 F (37.2 C) (12/18 0500) Temp Source: Oral (12/18 0500) BP: 96/70 mmHg (12/18 0818) Pulse Rate: 100 (12/18 0818)  Labs:  Recent Labs  05/08/14 0940  05/08/14 1100 05/08/14 1209 05/09/14 0639 05/10/14 0515  HGB  --   < > 12.7*  --  12.4* 12.1*  HCT  --   --  37.1*  --  36.0* 35.8*  PLT  --   --  125*  --  130* 125*  LABPROT  --   --   --  25.8* 29.0* 28.5*  INR  --   --   --  2.34* 2.71* 2.65*  CREATININE 1.75*  --   --   --  1.69*  --   TROPONINI <0.30  --   --   --   --   --   < > = values in this interval not displayed.  Estimated Creatinine Clearance: 28.5 mL/min (by C-G formula based on Cr of 1.69).   Assessment: 80 y/o male on chronic warfarin for hx DVT. INR is therapeutic at 2.65. No bleeding noted, CBC is stable today.   Goal of Therapy:  INR 2-3 Monitor platelets by anticoagulation protocol: Yes   Plan:  - Warfarin 2.5 mg PO daily - INR daily - Monitor for s/sx of bleeding  [facility], [facility].D., BCPS Clinical Pharmacist Pager: [phone] 05/10/2014 10:52 AM

## 2014-05-10 NOTE — Progress Notes (Signed)
Patient ID: VRISHANK MOSTER  male  DGL:[id number]    DOB: 10/30/1920    DOA: 05/08/2014  PCP: [name], MD  Assessment/Plan: Principal Problem:   SIRS (systemic inflammatory response syndrome) - Symptoms improving, afebrile, no leukocytosis, pro-calcitonin less than 0.1, blood cultures negative so far, UA negative - Chest x-ray showed chronic cardiomegaly with mildly increased left lung base opacity favor atelectasis - cultures remained negative, remains afebrile, dc antibiotics  Active Problems:   HTN (hypertension) - Currently stable,    Debility with falls - PTOT evaluation recommended skilled nursing facility     Chronic systolic heart failure:  - Currently euvolemic, hold Lasix for now, patient is not eating that well    Shingles rash - Continue Valtrex    Lactic acidosis with Dehydration -  repeat lactic acidosis improved to 3.6  History of DVT : On Coumadin   DVT Prophylaxis:On Coumadin  Code Status:Full code  Family Communication: Discussed in detail with patient's son, Mr. Jamerion Cabello, okay with skilled nursing facility requested that social worker call his sister  Disposition: Likely tomorrow if bed is available  Consultants: None  Procedures: None  Antibiotics:  IV vancomycin discontinued 12/18  Cefepime discontinued 12/18  Subjective: Patient seen and examined, afebrile, denies any specific complaints, just very weak  Objective: Weight change: -0.666 kg (-1 lb 7.5 oz)  Intake/Output Summary (Last 24 hours) at 05/10/14 1158 Last data filed at 05/10/14 1042  Gross per 24 hour  Intake    477 ml  Output    450 ml  Net     27 ml   Blood pressure 96/70, pulse 100, temperature 99 F (37.2 C), temperature source Oral, resp. rate 18, height 5\' 9"  (1.753 m), weight 74.903 kg (165 lb 2.1 oz), SpO2 100 %.  Physical Exam: General: Alert and awake, NAD CVS: S1 and S2 clear, regular rate and rhythm Chest: CTA B Abdomen: soft nontender,  nondistended, normal bowel sounds  Extremities: no cyanosis, clubbing, trace edema noted bilaterally   Lab Results: Basic Metabolic Panel:  Recent Labs Lab 05/08/14 0940 05/09/14 0639  NA 138 139  K 4.2 3.7  CL 94* 98  CO2 23 27  GLUCOSE 166* 92  BUN 43* 39*  CREATININE 1.75* 1.69*  CALCIUM 8.7 8.5   Liver Function Tests: No results for input(s): AST, ALT, ALKPHOS, BILITOT, PROT, ALBUMIN in the last 168 hours. No results for input(s): LIPASE, AMYLASE in the last 168 hours. No results for input(s): AMMONIA in the last 168 hours. CBC:  Recent Labs Lab 05/08/14 1100 05/09/14 0639 05/10/14 0515  WBC 8.4 7.7 7.9  NEUTROABS 7.3  --   --   HGB 12.7* 12.4* 12.1*  HCT 37.1* 36.0* 35.8*  MCV 86.1 83.3 84.0  PLT 125* 130* 125*   Cardiac Enzymes:  Recent Labs Lab 05/08/14 0940  TROPONINI <0.30   BNP: Invalid input(s): POCBNP CBG:  Recent Labs Lab 05/08/14 2124  GLUCAP 177*     Micro Results: Recent Results (from the past 240 hour(s))  Urine culture     Status: None   Collection Time: 05/08/14 10:18 AM  Result Value Ref Range Status   Specimen Description URINE, CLEAN CATCH  Final   Special Requests NONE  Final   Culture  Setup Time   Final    05/08/2014 11:52 Performed at 05/10/2014 Count   Final    30,000 COLONIES/ML Performed at [facility]  Final    Multiple bacterial morphotypes present, none predominant. Suggest appropriate recollection if clinically indicated. Performed at [facility]    Report Status 05/09/2014 FINAL  Final  Blood culture (routine x 2)     Status: None (Preliminary result)   Collection Time: 05/08/14 11:00 AM  Result Value Ref Range Status   Specimen Description BLOOD RIGHT HAND  Final   Special Requests BOTTLES DRAWN AEROBIC AND ANAEROBIC 5CC  Final   Culture  Setup Time   Final    05/08/2014 14:31 Performed at [facility]    Culture   Final           BLOOD  CULTURE RECEIVED NO GROWTH TO DATE CULTURE WILL BE HELD FOR 5 DAYS BEFORE ISSUING A FINAL NEGATIVE REPORT Performed at [facility]    Report Status PENDING  Incomplete  Blood culture (routine x 2)     Status: None (Preliminary result)   Collection Time: 05/08/14 11:05 AM  Result Value Ref Range Status   Specimen Description BLOOD RIGHT ANTECUBITAL  Final   Special Requests BOTTLES DRAWN AEROBIC AND ANAEROBIC  Final   Culture  Setup Time   Final    05/08/2014 17:12 Performed at [facility]    Culture   Final           BLOOD CULTURE RECEIVED NO GROWTH TO DATE CULTURE WILL BE HELD FOR 5 DAYS BEFORE ISSUING A FINAL NEGATIVE REPORT Performed at [facility]    Report Status PENDING  Incomplete    Studies/Results: Dg Chest 2 View  05/08/2014   CLINICAL DATA:  80 year old male who fell this morning with shortness of breath and left hip pain. Initial encounter.  EXAM: CHEST  2 VIEW  COMPARISON:  04/22/2014 and earlier.  FINDINGS: Seated upright AP and lateral views of the chest. The patient arms are not raised. Stable cardiomegaly and mediastinal contours. No pneumothorax or pulmonary edema. Mildly increased elevation of the left hemidiaphragm and streaky left lung base opacity. No other acute pulmonary opacity. Osteopenia. Chronic deformity of the proximal left humerus. Calcified aortic atherosclerosis.  IMPRESSION: Chronic cardiomegaly with mildly increased left lung base opacity, favor atelectasis.   Electronically Signed   By: Augusto Gamble M.D.   On: 05/08/2014 10:20   Dg Chest 2 View  04/20/2014   CLINICAL DATA:  Shortness of breath, cough  EXAM: CHEST  2 VIEW  COMPARISON:  01/12/2014  FINDINGS: Patchy left lower lobe opacity, suspicious for pneumonia.  Underlying chronic interstitial markings. No pleural effusion or pneumothorax.  The heart is normal size.  Degenerative changes of the visualized thoracolumbar spine.  IMPRESSION: Patchy left lower lobe opacity,  suspicious for pneumonia.   Electronically Signed   By: Charline Bills M.D.   On: 04/20/2014 15:27   Dg Hip Complete Left  05/08/2014   CLINICAL DATA:  80 year old male with history of trauma from a fall this morning complaining of anterior left hip pain.  EXAM: LEFT HIP - COMPLETE 2+ VIEW  COMPARISON:  No priors.  FINDINGS: AP view of the pelvis and AP and lateral views of the left hip demonstrate no acute displaced fracture of the bony pelvic ring. Bilateral proximal femurs as visualized appear intact, in the left femoral head is properly located. Joint space narrowing, subchondral sclerosis, subchondral cyst formation and osteophyte formation is noted in the hip joints bilaterally, compatible with moderate osteoarthritis. Numerous vascular calcifications are noted.  IMPRESSION: 1. No acute radiographic abnormality of  the bony pelvis or left hip. 2. Moderate bilateral hip joint osteoarthritis. 3. Atherosclerosis.   Electronically Signed   By: Trudie Reed M.D.   On: 05/08/2014 10:21   Ct Head Wo Contrast  05/08/2014   CLINICAL DATA:  80 year old male who fell with head injury last night. Gait and balance problems. Shortness of Breath. Initial encounter.  EXAM: CT HEAD WITHOUT CONTRAST  TECHNIQUE: Contiguous axial images were obtained from the base of the skull through the vertex without intravenous contrast.  COMPARISON:  Head CT without contrast 10/04/2010.  FINDINGS: No acute orbits soft tissue findings. Minimal to Mild scalp hematoma or contusion at the posterior vertex. Other scalp soft tissues within normal limits. Calvarium intact.  Chronic polypoid opacity in the left sphenoid sinus, partially calcified and increased since 2012. Other Visualized paranasal sinuses and mastoids are clear.  Interval age congruent cerebral volume loss. Chronic dural calcifications. No ventriculomegaly. No midline shift, mass effect, or evidence of intracranial mass lesion. Small chronic lacunar infarct in the  right cerebellum is new. Patchy increased bilateral periventricular white matter hypodensity. No evidence of cortically based acute infarction identified. No acute intracranial hemorrhage identified. No suspicious intracranial vascular hyperdensity.  IMPRESSION: 1. Mild posterior scalp soft tissue injury without underlying fracture. 2. No acute intracranial abnormality. Mild progression of nonspecific white matter changes and cerebellar small vessel ischemia since 2012.   Electronically Signed   By: Augusto Gamble M.D.   On: 05/08/2014 10:14   Dg Chest Port 1 View  04/22/2014   CLINICAL DATA:  Acute onset of shortness of breath. Subsequent study.  EXAM: PORTABLE CHEST - 1 VIEW  COMPARISON:  Chest radiograph performed 04/20/2014  FINDINGS: The lungs are well-aerated. Bibasilar airspace opacities are noted, with associated vascular congestion and small bilateral pleural effusions. This is concerning for pulmonary edema. No pneumothorax is seen.  The cardiomediastinal silhouette is borderline normal in size. No acute osseous abnormalities are seen. Chronic deformity is noted at the proximal left humerus.  IMPRESSION: Bibasilar airspace opacities, with associated vascular congestion and small bilateral pleural effusions, concerning for pulmonary edema.   Electronically Signed   By: Roanna Raider M.D.   On: 04/22/2014 03:28    Medications: Scheduled Meds: . calcium-vitamin D  1 tablet Oral Daily  . dextromethorphan-guaiFENesin  1 tablet Oral BID  . feeding supplement (ENSURE COMPLETE)  237 mL Oral BID BM  . sodium chloride  3 mL Intravenous Q12H  . valACYclovir  1,000 mg Oral Q24H  . warfarin  2.5 mg Oral q1800  . Warfarin - Pharmacist Dosing Inpatient   Does not apply q1800      LOS: 2 days   Catarina Huntley M.D. [facility] 05/10/2014, 11:58 AM Pager: [phone]  If 7PM-7AM, please contact night-coverage [email or web address] Password [email or web address]

## 2014-05-10 NOTE — Progress Notes (Signed)
Pt without recorded urine output for 13 hours now. Pt has tried to urinate into urinal during this shift. Bladder scan revealed 180 mL urine. Will page MD and continue to monitor.

## 2014-05-10 NOTE — Progress Notes (Signed)
CSW (Clinical Child psychotherapist) spoke with pt daughter Bonita Quin to provide bed offers. Bonita Quin did listen to bed offers but she informed CSW that after speaking with other family members they would prefer for pt to dc home with home health services. Pt mother is also at home so family members are always present to provide 24 hrs supervision/assistance. Pt daughter requesting [facility] with [facility] RN CM notified of decision. CSW signing off.  Adamari Frede, LCSWA [phone]

## 2014-05-10 NOTE — Clinical Documentation Improvement (Signed)
Supporting Information: Concern for early Sepsis with SIRS criteria per 12/16 progress notes.  Labs:  lactic acid: 12/16:  7.14. 12/17:  3.6.   Possible Clinical Condition? . Sepsis-specify causative organism if known . Sepsis due to: --Device --Implant --Graft --Infusion --Abortion . Severe sepsis-sepsis with organ dysfunction --Specify organ dysfunction Respiratory failure Encephalopathy Acute kidney failure Other (specify) . SIRS (Systemic Inflammatory Response Syndrome --With or without organ dysfunction . Document septic shock if present . Document any associated diagnoses/conditions    Thank Gabriel Cirri Documentation Specialist [phone] [place] [email or web address]

## 2014-05-11 LAB — PROTIME-INR
INR: 2.09 — ABNORMAL HIGH (ref 0.00–1.49)
Prothrombin Time: 23.6 seconds — ABNORMAL HIGH (ref 11.6–15.2)

## 2014-05-11 LAB — URINE CULTURE
CULTURE: NO GROWTH
Colony Count: NO GROWTH

## 2014-05-11 LAB — CBC
HEMATOCRIT: 37.2 % — AB (ref 39.0–52.0)
Hemoglobin: 12.7 g/dL — ABNORMAL LOW (ref 13.0–17.0)
MCH: 29 pg (ref 26.0–34.0)
MCHC: 34.1 g/dL (ref 30.0–36.0)
MCV: 84.9 fL (ref 78.0–100.0)
Platelets: 137 10*3/uL — ABNORMAL LOW (ref 150–400)
RBC: 4.38 MIL/uL (ref 4.22–5.81)
RDW: 20.3 % — ABNORMAL HIGH (ref 11.5–15.5)
WBC: 7.8 10*3/uL (ref 4.0–10.5)

## 2014-05-11 MED ORDER — BOOST PLUS PO LIQD
237.0000 mL | Freq: Three times a day (TID) | ORAL | Status: DC
  Administered 2014-05-11 – 2014-05-13 (×5): 237 mL via ORAL
  Filled 2014-05-11 (×11): qty 237

## 2014-05-11 MED ORDER — WARFARIN SODIUM 4 MG PO TABS
4.0000 mg | ORAL_TABLET | Freq: Once | ORAL | Status: AC
  Administered 2014-05-11: 4 mg via ORAL
  Filled 2014-05-11 (×2): qty 1

## 2014-05-11 NOTE — Progress Notes (Signed)
Patient ID: [name] [name]  male  HYQ:[id number]    DOB: Mar 28, 1921    DOA: 05/08/2014  PCP: [name], MD   Brief history of present illness Patient is a 80 year old male with chronic systolic CHF, DVT on Coumadin, CK D stage III, anemia, peripheral vascular disease, hypertension, rheumatoid arthritis who presented to ED after a fall. He was recently discharged on 04/25/14 after H And pulmonary edema with acute on chronic systolic CHF. Patient lives at home with his wife and son. Patient apparently fell at night before the admission after getting up from the bed on his left side, ever since had trouble with his left hip pain. Patient reported that his left hip gives out. He was doing well after discharge, complained of increasing weakness and shortness of breath in the last 2-3 days prior to admission. Patient's son also reported that he had not been eating well and was complaining of being thirsty.   Assessment/Plan: Principal Problem:   SIRS (systemic inflammatory response syndrome) - Symptoms improving, afebrile, no leukocytosis, pro-calcitonin less than 0.1, blood cultures negative so far, UA negative - Chest x-ray showed chronic cardiomegaly with mildly increased left lung base opacity favor atelectasis - Currently off antibiotics  Active Problems:   HTN (hypertension) - Currently stable,    Debility with falls - PTOT evaluation recommended skilled nursing facility-> family declined - Started on boost nutritional supplements, hardly eating     Chronic systolic heart failure:  - Net balance positive 202 mL, patient however is not eating much, may need to restart low-dose Lasix tomorrow    Shingles rash - Continue Valtrex    Lactic acidosis with Dehydration -  repeat lactic acidosis improved to 3.6  History of DVT : On Coumadin   DVT Prophylaxis:On Coumadin  Code Status:Full code  Family Communication: Discussed in detail with patient's son yesterday, okay with  skilled nursing facility   Disposition: Social worker communicated with patient's sister who requested for patient to DC home with home health services and they will provide 24/7 care. Hopefully improves somewhat more, DC in next 24-48 hours.  Consultants: None  Procedures: None  Antibiotics:  IV vancomycin discontinued 12/18  Cefepime discontinued 12/18  Subjective: He is very weak, hardly eating, denies any specific complaints  Objective: Weight change: 0.485 kg (1 lb 1.1 oz)  Intake/Output Summary (Last 24 hours) at 05/11/14 1129 Last data filed at 05/11/14 0500  Gross per 24 hour  Intake    300 ml  Output    150 ml  Net    150 ml   Blood pressure 110/66, pulse 105, temperature 98.5 F (36.9 C), temperature source Oral, resp. rate 18, height  (1.753 m), weight 75.388 kg (166 lb 3.2 oz), SpO2 99 %.  Physical Exam: General: Alert and awake, NAD CVS: S1 and S2 clear, regular rate and rhythm Chest: CTA B Abdomen: soft nontender, nondistended, normal bowel sounds  Extremities: no c/c/e bilaterally   Lab Results: Basic Metabolic Panel:  Recent Labs Lab 05/08/14 0940 05/09/14 0639  NA 138 139  K 4.2 3.7  CL 94* 98  CO2 23 27  GLUCOSE 166* 92  BUN 43* 39*  CREATININE 1.75* 1.69*  CALCIUM 8.7 8.5   Liver Function Tests: No results for input(s): AST, ALT, ALKPHOS, BILITOT, PROT, ALBUMIN in the last 168 hours. No results for input(s): LIPASE, AMYLASE in the last 168 hours. No results for input(s): AMMONIA in the last 168 hours. CBC:  Recent Labs Lab 05/08/14  1100  05/10/14 0515 05/11/14 0310  WBC 8.4  < > 7.9 7.8  NEUTROABS 7.3  --   --   --   HGB 12.7*  < > 12.1* 12.7*  HCT 37.1*  < > 35.8* 37.2*  MCV 86.1  < > 84.0 84.9  PLT 125*  < > 125* 137*  < > = values in this interval not displayed. Cardiac Enzymes:  Recent Labs Lab 05/08/14 0940  TROPONINI <0.30   BNP: Invalid input(s): POCBNP CBG:  Recent Labs Lab 05/08/14 2124  GLUCAP 177*      Micro Results: Recent Results (from the past 240 hour(s))  Urine culture     Status: None   Collection Time: 05/08/14 10:18 AM  Result Value Ref Range Status   Specimen Description URINE, CLEAN CATCH  Final   Special Requests NONE  Final   Culture  Setup Time   Final    05/08/2014 11:52 Performed at [facility] Count   Final    30,000 COLONIES/ML Performed at [facility]    Culture   Final    Multiple bacterial morphotypes present, none predominant. Suggest appropriate recollection if clinically indicated. Performed at [facility]    Report Status 05/09/2014 FINAL  Final  Blood culture (routine x 2)     Status: None (Preliminary result)   Collection Time: 05/08/14 11:00 AM  Result Value Ref Range Status   Specimen Description BLOOD RIGHT HAND  Final   Special Requests BOTTLES DRAWN AEROBIC AND ANAEROBIC 5CC  Final   Culture  Setup Time   Final    05/08/2014 14:31 Performed at [facility]    Culture   Final           BLOOD CULTURE RECEIVED NO GROWTH TO DATE CULTURE WILL BE HELD FOR 5 DAYS BEFORE ISSUING A FINAL NEGATIVE REPORT Performed at [facility]    Report Status PENDING  Incomplete  Blood culture (routine x 2)     Status: None (Preliminary result)   Collection Time: 05/08/14 11:05 AM  Result Value Ref Range Status   Specimen Description BLOOD RIGHT ANTECUBITAL  Final   Special Requests BOTTLES DRAWN AEROBIC AND ANAEROBIC  Final   Culture  Setup Time   Final    05/08/2014 17:12 Performed at [facility]    Culture   Final           BLOOD CULTURE RECEIVED NO GROWTH TO DATE CULTURE WILL BE HELD FOR 5 DAYS BEFORE ISSUING A FINAL NEGATIVE REPORT Performed at [facility]    Report Status PENDING  Incomplete    Studies/Results: Dg Chest 2 View  05/08/2014   CLINICAL DATA:  80 year old male who fell this morning with shortness of breath and left hip pain. Initial encounter.  EXAM:  CHEST  2 VIEW  COMPARISON:  04/22/2014 and earlier.  FINDINGS: Seated upright AP and lateral views of the chest. The patient arms are not raised. Stable cardiomegaly and mediastinal contours. No pneumothorax or pulmonary edema. Mildly increased elevation of the left hemidiaphragm and streaky left lung base opacity. No other acute pulmonary opacity. Osteopenia. Chronic deformity of the proximal left humerus. Calcified aortic atherosclerosis.  IMPRESSION: Chronic cardiomegaly with mildly increased left lung base opacity, favor atelectasis.   Electronically Signed   By: Augusto Gamble M.D.   On: 05/08/2014 10:20   Dg Chest 2 View  04/20/2014   CLINICAL DATA:  Shortness of breath, cough  EXAM: CHEST  2 VIEW  COMPARISON:  01/12/2014  FINDINGS: Patchy left lower lobe opacity, suspicious for pneumonia.  Underlying chronic interstitial markings. No pleural effusion or pneumothorax.  The heart is normal size.  Degenerative changes of the visualized thoracolumbar spine.  IMPRESSION: Patchy left lower lobe opacity, suspicious for pneumonia.   Electronically Signed   By: Charline Bills M.D.   On: 04/20/2014 15:27   Dg Hip Complete Left  05/08/2014   CLINICAL DATA:  80 year old male with history of trauma from a fall this morning complaining of anterior left hip pain.  EXAM: LEFT HIP - COMPLETE 2+ VIEW  COMPARISON:  No priors.  FINDINGS: AP view of the pelvis and AP and lateral views of the left hip demonstrate no acute displaced fracture of the bony pelvic ring. Bilateral proximal femurs as visualized appear intact, in the left femoral head is properly located. Joint space narrowing, subchondral sclerosis, subchondral cyst formation and osteophyte formation is noted in the hip joints bilaterally, compatible with moderate osteoarthritis. Numerous vascular calcifications are noted.  IMPRESSION: 1. No acute radiographic abnormality of the bony pelvis or left hip. 2. Moderate bilateral hip joint osteoarthritis. 3.  Atherosclerosis.   Electronically Signed   By: Trudie Reed M.D.   On: 05/08/2014 10:21   Ct Head Wo Contrast  05/08/2014   CLINICAL DATA:  80 year old male who fell with head injury last night. Gait and balance problems. Shortness of Breath. Initial encounter.  EXAM: CT HEAD WITHOUT CONTRAST  TECHNIQUE: Contiguous axial images were obtained from the base of the skull through the vertex without intravenous contrast.  COMPARISON:  Head CT without contrast 10/04/2010.  FINDINGS: No acute orbits soft tissue findings. Minimal to Mild scalp hematoma or contusion at the posterior vertex. Other scalp soft tissues within normal limits. Calvarium intact.  Chronic polypoid opacity in the left sphenoid sinus, partially calcified and increased since 2012. Other Visualized paranasal sinuses and mastoids are clear.  Interval age congruent cerebral volume loss. Chronic dural calcifications. No ventriculomegaly. No midline shift, mass effect, or evidence of intracranial mass lesion. Small chronic lacunar infarct in the right cerebellum is new. Patchy increased bilateral periventricular white matter hypodensity. No evidence of cortically based acute infarction identified. No acute intracranial hemorrhage identified. No suspicious intracranial vascular hyperdensity.  IMPRESSION: 1. Mild posterior scalp soft tissue injury without underlying fracture. 2. No acute intracranial abnormality. Mild progression of nonspecific white matter changes and cerebellar small vessel ischemia since 2012.   Electronically Signed   By: Augusto Gamble M.D.   On: 05/08/2014 10:14   Dg Chest Port 1 View  04/22/2014   CLINICAL DATA:  Acute onset of shortness of breath. Subsequent study.  EXAM: PORTABLE CHEST - 1 VIEW  COMPARISON:  Chest radiograph performed 04/20/2014  FINDINGS: The lungs are well-aerated. Bibasilar airspace opacities are noted, with associated vascular congestion and small bilateral pleural effusions. This is concerning for  pulmonary edema. No pneumothorax is seen.  The cardiomediastinal silhouette is borderline normal in size. No acute osseous abnormalities are seen. Chronic deformity is noted at the proximal left humerus.  IMPRESSION: Bibasilar airspace opacities, with associated vascular congestion and small bilateral pleural effusions, concerning for pulmonary edema.   Electronically Signed   By: Roanna Raider M.D.   On: 04/22/2014 03:28    Medications: Scheduled Meds: . calcium-vitamin D  1 tablet Oral Daily  . dextromethorphan-guaiFENesin  1 tablet Oral BID  . feeding supplement (ENSURE COMPLETE)  237 mL Oral BID BM  .  lactose free nutrition  237 mL Oral TID WC  . sodium chloride  3 mL Intravenous Q12H  . valACYclovir  1,000 mg Oral Q24H  . warfarin  2.5 mg Oral q1800  . Warfarin - Pharmacist Dosing Inpatient   Does not apply q1800      LOS: 3 days   Marrianne Sica M.D. [facility] 05/11/2014, 11:29 AM Pager: [phone]  If 7PM-7AM, please contact night-coverage [email or web address] Password [email or web address]

## 2014-05-11 NOTE — Progress Notes (Signed)
ANTICOAGULATION CONSULT NOTE - Follow Up Consult  Pharmacy Consult for Coumadin Indication: hx DVT  Allergies  Allergen Reactions  . Iohexol Itching and Rash         Patient Measurements: Height: 5\' 9"  (175.3 cm) Weight: 166 lb 3.2 oz (75.388 kg) IBW/kg (Calculated) : 70.7 Heparin Dosing Weight:   Vital Signs: Temp: 98.5 F (36.9 C) (12/19 0642) Temp Source: Oral (12/19 09-13-1976) BP: 110/66 mmHg (12/19 0642) Pulse Rate: 105 (12/19 0642)  Labs:  Recent Labs  05/09/14 0639 05/10/14 0515 05/11/14 0310  HGB 12.4* 12.1* 12.7*  HCT 36.0* 35.8* 37.2*  PLT 130* 125* 137*  LABPROT 29.0* 28.5* 23.6*  INR 2.71* 2.65* 2.09*  CREATININE 1.69*  --   --     Estimated Creatinine Clearance: 28.5 mL/min (by C-G formula based on Cr of 1.69).  Assessment: [name] [name] continuing on Coumadin for hx DVT. INR (2.09) remains therapeutic but decreased most likely due to missed dose on 12/17 (appears dose was never entered) - will plan to give increased dose tonight and follow-up AM INR. - H/H and Plts improving - No significant bleeding reported  Goal of Therapy:  INR 2-3   Plan:  1. Coumadin 4mg  PO x 1 today, then will plan to resume PTA regimen 2.5mg  daily 2. Daily INR  1/18  05/11/2014,1:26 PM

## 2014-05-12 LAB — PROTIME-INR
INR: 2.11 — AB (ref 0.00–1.49)
Prothrombin Time: 23.9 seconds — ABNORMAL HIGH (ref 11.6–15.2)

## 2014-05-12 LAB — PROCALCITONIN: Procalcitonin: 0.11 ng/mL

## 2014-05-12 MED ORDER — WARFARIN SODIUM 2.5 MG PO TABS
2.5000 mg | ORAL_TABLET | Freq: Every day | ORAL | Status: DC
  Administered 2014-05-12: 2.5 mg via ORAL
  Filled 2014-05-12 (×2): qty 1

## 2014-05-12 MED ORDER — DIPHENHYDRAMINE HCL 12.5 MG/5ML PO ELIX
6.2500 mg | ORAL_SOLUTION | Freq: Four times a day (QID) | ORAL | Status: DC | PRN
  Administered 2014-05-12: 6.25 mg via ORAL
  Filled 2014-05-12 (×3): qty 5

## 2014-05-12 MED ORDER — FUROSEMIDE 40 MG PO TABS
40.0000 mg | ORAL_TABLET | Freq: Every day | ORAL | Status: DC
  Administered 2014-05-12 – 2014-05-13 (×2): 40 mg via ORAL
  Filled 2014-05-12 (×2): qty 1

## 2014-05-12 NOTE — Progress Notes (Signed)
Patient ID: [name] [name]  male  GEX:[id number]    DOB: 11/05/1920    DOA: 05/08/2014  PCP: [name], MD   Brief history of present illness Patient is a 80 year old male with chronic systolic CHF, DVT on Coumadin, CK D stage III, anemia, peripheral vascular disease, hypertension, rheumatoid arthritis who presented to ED after a fall. He was recently discharged on 04/25/14 after H And pulmonary edema with acute on chronic systolic CHF. Patient lives at home with his wife and son. Patient apparently fell at night before the admission after getting up from the bed on his left side, ever since had trouble with his left hip pain. Patient reported that his left hip gives out. He was doing well after discharge, complained of increasing weakness and shortness of breath in the last 2-3 days prior to admission. Patient's son also reported that he had not been eating well and was complaining of being thirsty.   Assessment/Plan: Principal Problem:   SIRS (systemic inflammatory response syndrome) - Symptoms improving, afebrile, no leukocytosis, pro-calcitonin less than 0.1, blood cultures negative so far, UA negative - Chest x-ray showed chronic cardiomegaly with mildly increased left lung base opacity favor atelectasis - Currently off antibiotics  Active Problems:   HTN (hypertension) - Currently stable,    Debility with falls - PTOT evaluation recommended skilled nursing facility-> family declined - Started on boost nutritional supplements, changed to dysphagia 3 diet,     Chronic systolic heart failure:  - Net -258 mL, patient however is not eating much, hold Lasix    Shingles rash - Continue Valtrex    Lactic acidosis with Dehydration -  repeat lactic acidosis improved to 3.6  History of DVT : On Coumadin   DVT Prophylaxis:On Coumadin  Code Status:Full code  Family Communication:    Disposition: SNF vs home health PT  Consultants: None   Procedures: None    Antibiotics:  IV vancomycin discontinued 12/18  Cefepime discontinued 12/18  Subjective: Still feeling weak, hardly eating but more cheerful today   Objective: Weight change: 1.724 kg (3 lb 12.8 oz)  Intake/Output Summary (Last 24 hours) at 05/12/14 1122 Last data filed at 05/12/14 0500  Gross per 24 hour  Intake    120 ml  Output    600 ml  Net   -480 ml   Blood pressure 106/63, pulse 94, temperature 98.6 F (37 C), temperature source Oral, resp. rate 18, height 5\' 9"  (1.753 m), weight 77.111 kg (170 lb), SpO2 98 %.  Physical Exam: General: Alert and awake, NAD CVS: S1 and S2 clear, regular rate and rhythm Chest: CTA B Abdomen: soft NT, ND, NBS Extremities: no c/c/e bilaterally   Lab Results: Basic Metabolic Panel:  Recent Labs Lab 05/08/14 0940 05/09/14 0639  NA 138 139  K 4.2 3.7  CL 94* 98  CO2 23 27  GLUCOSE 166* 92  BUN 43* 39*  CREATININE 1.75* 1.69*  CALCIUM 8.7 8.5   Liver Function Tests: No results for input(s): AST, ALT, ALKPHOS, BILITOT, PROT, ALBUMIN in the last 168 hours. No results for input(s): LIPASE, AMYLASE in the last 168 hours. No results for input(s): AMMONIA in the last 168 hours. CBC:  Recent Labs Lab 05/08/14 1100  05/10/14 0515 05/11/14 0310  WBC 8.4  < > 7.9 7.8  NEUTROABS 7.3  --   --   --   HGB 12.7*  < > 12.1* 12.7*  HCT 37.1*  < > 35.8* 37.2*  MCV 86.1  < >  84.0 84.9  PLT 125*  < > 125* 137*  < > = values in this interval not displayed. Cardiac Enzymes:  Recent Labs Lab 05/08/14 0940  TROPONINI <0.30   BNP: Invalid input(s): POCBNP CBG:  Recent Labs Lab 05/08/14 2124  GLUCAP 177*     Micro Results: Recent Results (from the past 240 hour(s))  Urine culture     Status: None   Collection Time: 05/08/14 10:18 AM  Result Value Ref Range Status   Specimen Description URINE, CLEAN CATCH  Final   Special Requests NONE  Final   Culture  Setup Time   Final    05/08/2014 11:52 Performed at [facility] Count   Final    30,000 COLONIES/ML Performed at [facility]    Culture   Final    Multiple bacterial morphotypes present, none predominant. Suggest appropriate recollection if clinically indicated. Performed at [facility]    Report Status 05/09/2014 FINAL  Final  Blood culture (routine x 2)     Status: None (Preliminary result)   Collection Time: 05/08/14 11:00 AM  Result Value Ref Range Status   Specimen Description BLOOD RIGHT HAND  Final   Special Requests BOTTLES DRAWN AEROBIC AND ANAEROBIC 5CC  Final   Culture  Setup Time   Final    05/08/2014 14:31 Performed at [facility]    Culture   Final           BLOOD CULTURE RECEIVED NO GROWTH TO DATE CULTURE WILL BE HELD FOR 5 DAYS BEFORE ISSUING A FINAL NEGATIVE REPORT Performed at [facility]    Report Status PENDING  Incomplete  Blood culture (routine x 2)     Status: None (Preliminary result)   Collection Time: 05/08/14 11:05 AM  Result Value Ref Range Status   Specimen Description BLOOD RIGHT ANTECUBITAL  Final   Special Requests BOTTLES DRAWN AEROBIC AND ANAEROBIC  Final   Culture  Setup Time   Final    05/08/2014 17:12 Performed at [facility]    Culture   Final           BLOOD CULTURE RECEIVED NO GROWTH TO DATE CULTURE WILL BE HELD FOR 5 DAYS BEFORE ISSUING A FINAL NEGATIVE REPORT Performed at [facility]    Report Status PENDING  Incomplete  Urine culture     Status: None   Collection Time: 05/10/14 10:41 AM  Result Value Ref Range Status   Specimen Description URINE, CLEAN CATCH  Final   Special Requests NONE  Final   Culture  Setup Time   Final    05/10/2014 14:54 Performed at [facility]    Colony Count NO GROWTH Performed at [facility]   Final   Culture NO GROWTH Performed at [facility]   Final   Report Status 05/11/2014 FINAL  Final    Studies/Results: Dg Chest 2 View  05/08/2014    CLINICAL DATA:  80 year old male who fell this morning with shortness of breath and left hip pain. Initial encounter.  EXAM: CHEST  2 VIEW  COMPARISON:  04/22/2014 and earlier.  FINDINGS: Seated upright AP and lateral views of the chest. The patient arms are not raised. Stable cardiomegaly and mediastinal contours. No pneumothorax or pulmonary edema. Mildly increased elevation of the left hemidiaphragm and streaky left lung base opacity. No other acute pulmonary opacity. Osteopenia. Chronic deformity of the proximal left humerus. Calcified aortic atherosclerosis.  IMPRESSION: Chronic cardiomegaly with mildly increased left lung base opacity, favor atelectasis.   Electronically Signed   By: Augusto Gamble M.D.   On: 05/08/2014 10:20   Dg Chest 2 View  04/20/2014   CLINICAL DATA:  Shortness of breath, cough  EXAM: CHEST  2 VIEW  COMPARISON:  01/12/2014  FINDINGS: Patchy left lower lobe opacity, suspicious for pneumonia.  Underlying chronic interstitial markings. No pleural effusion or pneumothorax.  The heart is normal size.  Degenerative changes of the visualized thoracolumbar spine.  IMPRESSION: Patchy left lower lobe opacity, suspicious for pneumonia.   Electronically Signed   By: Charline Bills M.D.   On: 04/20/2014 15:27   Dg Hip Complete Left  05/08/2014   CLINICAL DATA:  80 year old male with history of trauma from a fall this morning complaining of anterior left hip pain.  EXAM: LEFT HIP - COMPLETE 2+ VIEW  COMPARISON:  No priors.  FINDINGS: AP view of the pelvis and AP and lateral views of the left hip demonstrate no acute displaced fracture of the bony pelvic ring. Bilateral proximal femurs as visualized appear intact, in the left femoral head is properly located. Joint space narrowing, subchondral sclerosis, subchondral cyst formation and osteophyte formation is noted in the hip joints bilaterally, compatible with moderate osteoarthritis. Numerous vascular calcifications are noted.  IMPRESSION: 1. No  acute radiographic abnormality of the bony pelvis or left hip. 2. Moderate bilateral hip joint osteoarthritis. 3. Atherosclerosis.   Electronically Signed   By: Trudie Reed M.D.   On: 05/08/2014 10:21   Ct Head Wo Contrast  05/08/2014   CLINICAL DATA:  80 year old male who fell with head injury last night. Gait and balance problems. Shortness of Breath. Initial encounter.  EXAM: CT HEAD WITHOUT CONTRAST  TECHNIQUE: Contiguous axial images were obtained from the base of the skull through the vertex without intravenous contrast.  COMPARISON:  Head CT without contrast 10/04/2010.  FINDINGS: No acute orbits soft tissue findings. Minimal to Mild scalp hematoma or contusion at the posterior vertex. Other scalp soft tissues within normal limits. Calvarium intact.  Chronic polypoid opacity in the left sphenoid sinus, partially calcified and increased since 2012. Other Visualized paranasal sinuses and mastoids are clear.  Interval age congruent cerebral volume loss. Chronic dural calcifications. No ventriculomegaly. No midline shift, mass effect, or evidence of intracranial mass lesion. Small chronic lacunar infarct in the right cerebellum is new. Patchy increased bilateral periventricular white matter hypodensity. No evidence of cortically based acute infarction identified. No acute intracranial hemorrhage identified. No suspicious intracranial vascular hyperdensity.  IMPRESSION: 1. Mild posterior scalp soft tissue injury without underlying fracture. 2. No acute intracranial abnormality. Mild progression of nonspecific white matter changes and cerebellar small vessel ischemia since 2012.   Electronically Signed   By: Augusto Gamble M.D.   On: 05/08/2014 10:14   Dg Chest Port 1 View  04/22/2014   CLINICAL DATA:  Acute onset of shortness of breath. Subsequent study.  EXAM: PORTABLE CHEST - 1 VIEW  COMPARISON:  Chest radiograph performed 04/20/2014  FINDINGS: The lungs are well-aerated. Bibasilar airspace opacities are  noted, with associated vascular congestion and small bilateral pleural effusions. This is concerning for pulmonary edema. No pneumothorax is seen.  The cardiomediastinal silhouette is borderline normal in size. No acute osseous abnormalities are seen. Chronic deformity is noted at the proximal left humerus.  IMPRESSION: Bibasilar airspace opacities, with associated vascular congestion and small bilateral pleural effusions, concerning for pulmonary edema.   Electronically Signed  By: Roanna Raider M.D.   On: 04/22/2014 03:28    Medications: Scheduled Meds: . calcium-vitamin D  1 tablet Oral Daily  . dextromethorphan-guaiFENesin  1 tablet Oral BID  . feeding supplement (ENSURE COMPLETE)  237 mL Oral BID BM  . lactose free nutrition  237 mL Oral TID WC  . sodium chloride  3 mL Intravenous Q12H  . valACYclovir  1,000 mg Oral Q24H  . Warfarin - Pharmacist Dosing Inpatient   Does not apply q1800      LOS: 4 days   Aubrea Meixner M.D. [facility] 05/12/2014, 11:22 AM Pager: [phone]  If 7PM-7AM, please contact night-coverage [email or web address] Password [email or web address]

## 2014-05-12 NOTE — Progress Notes (Signed)
ANTICOAGULATION CONSULT NOTE - Follow Up Consult  Pharmacy Consult for Coumadin Indication: hx DVT  Allergies  Allergen Reactions  . Iohexol Itching and Rash         Patient Measurements: Height: 5\' 9"  (175.3 cm) Weight: 170 lb (77.111 kg) IBW/kg (Calculated) : 70.7 Heparin Dosing Weight:   Vital Signs: Temp: 98.6 F (37 C) (12/20 0635) Temp Source: Oral (12/20 0635) BP: 106/63 mmHg (12/20 0635) Pulse Rate: 94 (12/20 0635)  Labs:  Recent Labs  05/10/14 0515 05/11/14 0310 05/12/14 0359  HGB 12.1* 12.7*  --   HCT 35.8* 37.2*  --   PLT 125* 137*  --   LABPROT 28.5* 23.6* 23.9*  INR 2.65* 2.09* 2.11*    Estimated Creatinine Clearance: 28.5 mL/min (by C-G formula based on Cr of 1.69).  Assessment: 89yom continuing on Coumadin for hx DVT. INR (2.11) remains therapeutic and stopped trending down s/p increased Coumadin dose (4mg  x 1) - will restart PTA Coumadin regimen (2.5mg  daily) and follow-up AM INR. - No CBC this AM - No significant bleeding reported  Goal of Therapy:  INR 2-3   Plan:  1. Resume Coumadin PTA regimen 2.5mg  daily 2. Daily INR  05/14/14  05/12/2014,12:38 PM

## 2014-05-13 LAB — BASIC METABOLIC PANEL
Anion gap: 16 — ABNORMAL HIGH (ref 5–15)
BUN: 27 mg/dL — ABNORMAL HIGH (ref 6–23)
CO2: 26 mEq/L (ref 19–32)
Calcium: 8.6 mg/dL (ref 8.4–10.5)
Chloride: 95 mEq/L — ABNORMAL LOW (ref 96–112)
Creatinine, Ser: 1.45 mg/dL — ABNORMAL HIGH (ref 0.50–1.35)
GFR calc Af Amer: 47 mL/min — ABNORMAL LOW (ref >90)
GFR, EST NON AFRICAN AMERICAN: 41 mL/min — AB (ref >90)
Glucose, Bld: 122 mg/dL — ABNORMAL HIGH (ref 70–99)
Potassium: 4 mEq/L (ref 3.7–5.3)
Sodium: 137 mEq/L (ref 137–147)

## 2014-05-13 LAB — PROTIME-INR
INR: 2.46 — ABNORMAL HIGH (ref 0.00–1.49)
Prothrombin Time: 26.9 seconds — ABNORMAL HIGH (ref 11.6–15.2)

## 2014-05-13 MED ORDER — PREDNISONE 10 MG PO TABS: 10.0000 mg | ORAL_TABLET | Freq: Every day | ORAL | Status: AC

## 2014-05-13 MED ORDER — METOPROLOL TARTRATE 25 MG PO TABS: 12.5000 mg | ORAL_TABLET | Freq: Two times a day (BID) | ORAL | Status: AC

## 2014-05-13 MED ORDER — METOPROLOL TARTRATE 12.5 MG HALF TABLET
12.5000 mg | ORAL_TABLET | Freq: Two times a day (BID) | ORAL | Status: DC
  Administered 2014-05-13: 12.5 mg via ORAL
  Filled 2014-05-13 (×2): qty 1

## 2014-05-13 MED ORDER — HYDROCODONE-ACETAMINOPHEN 5-325 MG PO TABS: 1.0000 | ORAL_TABLET | Freq: Four times a day (QID) | ORAL | Status: AC | PRN

## 2014-05-13 MED ORDER — FUROSEMIDE 40 MG PO TABS: 40.0000 mg | ORAL_TABLET | Freq: Every day | ORAL | Status: AC

## 2014-05-13 MED ORDER — SENNOSIDES-DOCUSATE SODIUM 8.6-50 MG PO TABS: 1.0000 | ORAL_TABLET | Freq: Every evening | ORAL | Status: AC | PRN

## 2014-05-13 MED ORDER — POTASSIUM CHLORIDE ER 10 MEQ PO TBCR: 10.0000 meq | EXTENDED_RELEASE_TABLET | Freq: Every day | ORAL | Status: AC

## 2014-05-13 MED ORDER — VALACYCLOVIR HCL 1 G PO TABS: 1000.0000 mg | ORAL_TABLET | Freq: Every day | ORAL | Status: AC

## 2014-05-13 MED ORDER — PREDNISONE 10 MG PO TABS
10.0000 mg | ORAL_TABLET | Freq: Every day | ORAL | Status: DC
  Administered 2014-05-13: 10 mg via ORAL
  Filled 2014-05-13 (×2): qty 1

## 2014-05-13 NOTE — Discharge Summary (Signed)
Physician Discharge Summary  Patient ID: [name] [name] MRN: [id number] DOB/AGE: 10-03-1920 80 y.o.  Admit date: 05/08/2014 Discharge date: 05/13/2014  Primary Care Physician:  [name], MD  Discharge Diagnoses:    . SIRS (systemic inflammatory response syndrome)/sepsis with acute kidney injury  . Shingles rash . HTN (hypertension) . Debility with moderate protein malnutrition  . Lactic acidosis . Dehydration . Fall . Chronic systolic heart failure  Consults: None   Recommendations for Outpatient Follow-up:  PT evaluation recommended skilled nursing facility  Patient needs 1 more week of Valtrex for the shingles rash   TESTS THAT NEED FOLLOW-UP None   DIET: Dysphagia 3, thin liquids    Allergies:   Allergies  Allergen Reactions  . Iohexol Itching and Rash          Discharge Medications:   Medication List    STOP taking these medications        Acai 25 MG Caps     dextromethorphan-guaiFENesin 30-600 MG per 12 hr tablet  Commonly known as:  MUCINEX DM     isosorbide mononitrate 60 MG 24 hr tablet  Commonly known as:  IMDUR      TAKE these medications        albuterol 108 (90 BASE) MCG/ACT inhaler  Commonly known as:  PROVENTIL HFA;VENTOLIN HFA  Inhale 2 puffs into the lungs every 6 (six) hours as needed for wheezing or shortness of breath.     calcium-vitamin D 500-200 MG-UNIT per tablet  Commonly known as:  OSCAL WITH D  Take 1 tablet by mouth daily.     furosemide 40 MG tablet  Commonly known as:  LASIX  Take 1 tablet (40 mg total) by mouth daily.     guaiFENesin 600 MG 12 hr tablet  Commonly known as:  MUCINEX  Take 600 mg by mouth 2 (two) times daily.     HYDROcodone-acetaminophen 5-325 MG per tablet  Commonly known as:  NORCO/VICODIN  Take 1 tablet by mouth every 6 (six) hours as needed for moderate pain.     menthol-thymol Liqd  Apply 1 application topically as needed (for hip pain).     methotrexate 2.5 MG tablet   Commonly known as:  RHEUMATREX  Take 7.5 mg by mouth every Monday. Caution:Chemotherapy. Protect from light.     metoprolol tartrate 25 MG tablet  Commonly known as:  LOPRESSOR  Take 0.5 tablets (12.5 mg total) by mouth 2 (two) times daily.     multivitamin tablet  Take 1 tablet by mouth daily.     potassium chloride 10 MEQ tablet  Commonly known as:  K-DUR  Take 1 tablet (10 mEq total) by mouth daily.     predniSONE 10 MG tablet  Commonly known as:  DELTASONE  Take 1 tablet (10 mg total) by mouth daily with breakfast.     PROBIOTIC DAILY PO  Take 1 tablet by mouth daily.     senna-docusate 8.6-50 MG per tablet  Commonly known as:  Senokot-S  Take 1 tablet by mouth at bedtime as needed for mild constipation.     valACYclovir 1000 MG tablet  Commonly known as:  VALTREX  Take 1 tablet (1,000 mg total) by mouth daily. x1 more week     warfarin 2.5 MG tablet  Commonly known as:  COUMADIN  Take 2.5 mg by mouth daily.         Brief H and P: For complete details please refer to admission H and P, but in  brief Patient is a 80 year old male with history of chronic systolic CHF, DVT on Coumadin, CKD stage III, anemia, peripheral vascular disease, hypertension, rheumatoid arthritis who presented to ED after a fall last night. Patient was recently discharged on 04/25/14 after HCAP and pulmonary edema with acute on chronic systolic CHF. Patient lives at home with his wife and son. History was obtained from the patient and son in the room. Patient apparently fell last night after getting up from the bed on his left side, ever since then has trouble with his left hip pain. Patient reports that his left hip gives out. Patient was doing well after discharge, complained of increasing weakness and shortness of breath in the last 2-3 days. Patient's son also reported that he has not been eating well and complaining of being thirsty. Patient denied hitting his head yesterday, denied any fevers,  chills, chest pain, nausea or vomiting or diarrhea.  Patient was seen by Dr. Shelle Iron as hospital follow-up one day prior to admission, recommended pulmonary function tests otherwise stable.  Hospital Course:   SIRS (systemic inflammatory response syndrome) with lactic acidosis, dehydration, acute kidney injury- improving Patient has clinically significantly improved, afebrile, no leukocytosis, Pro-calcitonin less than 0.1, blood cultures remained negative so far, UA negative. Lactic acid has improved. Chest x-ray showed chronic cardiomegaly with mildly increased left lung base opacity favor atelectasis. He was transitioned off antibiotics and has remained stable.   HTN (hypertension) - Currently stable   Debility with falls, moderate malnutrition - PTOT evaluation recommended skilled nursing facility - Started on boost nutritional supplements, changed to dysphagia 3 diet,   Chronic systolic heart failure:  - Restarted oral Lasix 40mg  daily, patient was on Lasix 40 mg twice a day on the previous discharge, however patient became dehydrated, had lactic acidosis the time of admission.   Shingles rash - Continue Valtrex for another week, continue airborne precautions   Lactic acidosis with Dehydration - repeat lactic acidosis improved to 3.6  History of DVT : On Coumadin   Day of Discharge BP 121/95 mmHg  Pulse 126  Temp(Src) 98.9 F (37.2 C) (Oral)  Resp 18  Ht 5\' 9"  (1.753 m)  Wt 75.7 kg (166 lb 14.2 oz)  BMI 24.63 kg/m2  SpO2 91%  Physical Exam: General: Alert and awake oriented, NAD CVS: S1-S2 clear no murmur rubs or gallops Chest: Decreased breath sounds at the bases Abdomen: soft nontender, nondistended, normal bowel sounds Extremities: no cyanosis, clubbing or edema noted bilaterally    The results of significant diagnostics from this hospitalization (including imaging, microbiology, ancillary and laboratory) are listed below for reference.    LAB  RESULTS: Basic Metabolic Panel:  Recent Labs Lab 05/09/14 0639 05/13/14 0408  NA 139 137  K 3.7 4.0  CL 98 95*  CO2 27 26  GLUCOSE 92 122*  BUN 39* 27*  CREATININE 1.69* 1.45*  CALCIUM 8.5 8.6   Liver Function Tests: No results for input(s): AST, ALT, ALKPHOS, BILITOT, PROT, ALBUMIN in the last 168 hours. No results for input(s): LIPASE, AMYLASE in the last 168 hours. No results for input(s): AMMONIA in the last 168 hours. CBC:  Recent Labs Lab 05/08/14 1100  05/10/14 0515 05/11/14 0310  WBC 8.4  < > 7.9 7.8  NEUTROABS 7.3  --   --   --   HGB 12.7*  < > 12.1* 12.7*  HCT 37.1*  < > 35.8* 37.2*  MCV 86.1  < > 84.0 84.9  PLT 125*  < >  125* 137*  < > = values in this interval not displayed. Cardiac Enzymes:  Recent Labs Lab 05/08/14 0940  TROPONINI <0.30   BNP: Invalid input(s): POCBNP CBG:  Recent Labs Lab 05/08/14 2124  GLUCAP 177*    Significant Diagnostic Studies:  Dg Chest 2 View  05/08/2014   CLINICAL DATA:  80 year old male who fell this morning with shortness of breath and left hip pain. Initial encounter.  EXAM: CHEST  2 VIEW  COMPARISON:  04/22/2014 and earlier.  FINDINGS: Seated upright AP and lateral views of the chest. The patient arms are not raised. Stable cardiomegaly and mediastinal contours. No pneumothorax or pulmonary edema. Mildly increased elevation of the left hemidiaphragm and streaky left lung base opacity. No other acute pulmonary opacity. Osteopenia. Chronic deformity of the proximal left humerus. Calcified aortic atherosclerosis.  IMPRESSION: Chronic cardiomegaly with mildly increased left lung base opacity, favor atelectasis.   Electronically Signed   By: Augusto Gamble M.D.   On: 05/08/2014 10:20   Dg Hip Complete Left  05/08/2014   CLINICAL DATA:  80 year old male with history of trauma from a fall this morning complaining of anterior left hip pain.  EXAM: LEFT HIP - COMPLETE 2+ VIEW  COMPARISON:  No priors.  FINDINGS: AP view of the  pelvis and AP and lateral views of the left hip demonstrate no acute displaced fracture of the bony pelvic ring. Bilateral proximal femurs as visualized appear intact, in the left femoral head is properly located. Joint space narrowing, subchondral sclerosis, subchondral cyst formation and osteophyte formation is noted in the hip joints bilaterally, compatible with moderate osteoarthritis. Numerous vascular calcifications are noted.  IMPRESSION: 1. No acute radiographic abnormality of the bony pelvis or left hip. 2. Moderate bilateral hip joint osteoarthritis. 3. Atherosclerosis.   Electronically Signed   By: Trudie Reed M.D.   On: 05/08/2014 10:21   Ct Head Wo Contrast  05/08/2014   CLINICAL DATA:  80 year old male who fell with head injury last night. Gait and balance problems. Shortness of Breath. Initial encounter.  EXAM: CT HEAD WITHOUT CONTRAST  TECHNIQUE: Contiguous axial images were obtained from the base of the skull through the vertex without intravenous contrast.  COMPARISON:  Head CT without contrast 10/04/2010.  FINDINGS: No acute orbits soft tissue findings. Minimal to Mild scalp hematoma or contusion at the posterior vertex. Other scalp soft tissues within normal limits. Calvarium intact.  Chronic polypoid opacity in the left sphenoid sinus, partially calcified and increased since 2012. Other Visualized paranasal sinuses and mastoids are clear.  Interval age congruent cerebral volume loss. Chronic dural calcifications. No ventriculomegaly. No midline shift, mass effect, or evidence of intracranial mass lesion. Small chronic lacunar infarct in the right cerebellum is new. Patchy increased bilateral periventricular white matter hypodensity. No evidence of cortically based acute infarction identified. No acute intracranial hemorrhage identified. No suspicious intracranial vascular hyperdensity.  IMPRESSION: 1. Mild posterior scalp soft tissue injury without underlying fracture. 2. No acute  intracranial abnormality. Mild progression of nonspecific white matter changes and cerebellar small vessel ischemia since 2012.   Electronically Signed   By: Augusto Gamble M.D.   On: 05/08/2014 10:14    2D ECHO:   Disposition and Follow-up:    DISPOSITION: Skilled nursing facility   DISCHARGE FOLLOW-UP Follow-up Information    Follow up with [facility].   Why:  Registered Nurse, Physical Therapy, Home Health Aide and Social Work Services to start within 24-48 hours of discharge   Contact information:  [address] [place] [address] [phone]       Follow up with BURNETT,BRENT A, MD. Schedule an appointment as soon as possible for a visit in 10 days.   Specialty:  Family Medicine   Why:  for hospital follow-up   Contact information:   [address] [place] [address] [phone]       Follow up with Lesleigh Noe, MD. Schedule an appointment as soon as possible for a visit in 2 weeks.   Specialty:  Cardiology   Why:  for hospital follow-up   Contact information:   [address] [place] [address] [phone]        Time spent on Discharge: 40 mins   Signed:   RAI,RIPUDEEP M.D. [facility] 05/13/2014, 12:20 PM Pager: [phone]

## 2014-05-13 NOTE — Progress Notes (Signed)
Physical Therapy Treatment Patient Details Name: [name] [name] MRN: [id number] DOB: Jan 03, 1921 Today's Date: 05/13/2014    History of Present Illness [name] [name]  is a 80 y.o. male, with known past medical history of pulmonary hypertension,Donna kidney disease, chronic kidney disease, rheumatoid arthritis, gout, DVT on anticoagulation, started in August of [facility] secondary to acute CHF exacerbation, recently admitted to [facility] w/ shortness of breath, cough, productive sputum, his x-ray showing evidence of pneumonia, He is currently admitted due to weakness, fatigue and falls w/ dx: SIRS. He is on airborne and contact precautions for shingles.    PT Comments    Pt making slow progress. Still requires significant assistance for mobility and will need ST-SNF at dc.  Follow Up Recommendations  SNF     Equipment Recommendations  None recommended by PT    Recommendations for Other Services       Precautions / Restrictions Precautions Precautions: Fall    Mobility  Bed Mobility Overal bed mobility: Needs Assistance Bed Mobility: Supine to Sit     Supine to sit: Mod assist     General bed mobility comments: Assist to bring trunk up and to bring legs off to sit.   Transfers Overall transfer level: Needs assistance Equipment used: Rolling walker (2 wheeled) Transfers: Sit to/from [employer] Sit to Stand: +2 physical assistance;Mod assist Stand pivot transfers: +2 physical assistance;Min assist       General transfer comment: Verbal cues for hand placement and assist to bring hips up. Pt with exaggerated forward flex of trunk to get up.  Ambulation/Gait Ambulation/Gait assistance: +2 physical assistance;Min assist Ambulation Distance (Feet): 2 Feet Assistive device: Rolling walker (2 wheeled) Gait Pattern/deviations: Step-through pattern;Decreased step length - right;Decreased step length - left;Shuffle;Trunk flexed   Gait velocity  interpretation: Below normal speed for age/gender General Gait Details: Verbal cues to stand more erect.   Stairs            Wheelchair Mobility    Modified Rankin (Stroke Patients Only)       Balance Overall balance assessment: Needs assistance Sitting-balance support: No upper extremity supported;Feet supported Sitting balance-Leahy Scale: Fair     Standing balance support: Bilateral upper extremity supported Standing balance-Leahy Scale: Poor Standing balance comment: walker and assist for static standing.                    Cognition Arousal/Alertness: Awake/alert Behavior During Therapy: WFL for tasks assessed/performed Overall Cognitive Status: Impaired/Different from baseline Area of Impairment: Safety/judgement;Problem solving;Memory;Orientation;Attention Orientation Level: Disoriented to;Place;Time Current Attention Level: Sustained Memory: Decreased recall of precautions;Decreased short-term memory   Safety/Judgement: Decreased awareness of safety;Decreased awareness of deficits   Problem Solving: Slow processing;Requires verbal cues;Requires tactile cues      Exercises      General Comments        Pertinent Vitals/Pain Pain Assessment: No/denies pain    Home Living                      Prior Function            PT Goals (current goals can now be found in the care plan section) Progress towards PT goals: Progressing toward goals    Frequency  Min 2X/week    PT Plan Current plan remains appropriate;Frequency needs to be updated    Co-evaluation             End of Session Equipment Utilized During Treatment: Gait belt;Oxygen Activity  Tolerance: Patient limited by fatigue Patient left: in chair;with call bell/phone within reach;with chair alarm set     Time: [phone] PT Time Calculation (min) (ACUTE ONLY): 26 min  Charges:  $Gait Training: 23-37 mins                    G Codes:       [name] [name] 2014/05/25, 10:35 AM  [facility] PT [phone]

## 2014-05-13 NOTE — Progress Notes (Signed)
Occupational Therapy Treatment Patient Details Name: [name] [name] MRN: [id number] DOB: June 19, 1920 Today's Date: 05/13/2014    History of present illness [name] [name]  is a 80 y.o. male, with known past medical history of pulmonary hypertension,Donna kidney disease, chronic kidney disease, rheumatoid arthritis, gout, DVT on anticoagulation, started in August of [facility] secondary to acute CHF exacerbation, recently admitted to [facility] w/ shortness of breath, cough, productive sputum, his x-ray showing evidence of pneumonia, He is currently admitted due to weakness, fatigue and falls w/ dx: SIRS. He is on airborne and contact precautions for shingles.   OT comments  This 80 yo male admitted with above presents to acute OT not making progress, fatiguing after 3 attempts to stand from recliner and DOE. He will continue to benefit from OT to work on increasing [place].  Follow Up Recommendations  SNF;Supervision/Assistance - 24 hour    Equipment Recommendations   (TBD at next venue)       Precautions / Restrictions Precautions Precautions: Fall Restrictions Weight Bearing Restrictions: No       Mobility Transfers Overall transfer level: Needs assistance               General transfer comment: See ADL comment section        ADL Overall ADL's : Needs assistance/impaired                                       General ADL Comments: Attempted to have pt work on sit<>stand from recliner with hopes of then trying a toilet transfer. Pt Mod A to scoot to EOC, with attempt to stand x2 with +1 A pt could not achieve even getting partially off of the recliner. Tried the [employer] and pt was pushing backward, actually did get some lift but instead of standing he ended up scooting himself back in the recliner.  At this point pt stated that he was worn out.                Cognition   Behavior During Therapy: WFL for tasks assessed/performed Overall  Cognitive Status: Impaired/Different from baseline Area of Impairment: Safety/judgement;Problem solving          Safety/Judgement: Decreased awareness of safety;Decreased awareness of deficits   Problem Solving: Slow processing;Requires tactile cues;Requires verbal cues;Difficulty sequencing                   Pertinent Vitals/ Pain       Pain Assessment: No/denies pain         Frequency Min 2X/week     Progress Toward Goals  OT Goals(current goals can now be found in the care plan section)  Progress towards OT goals: Not progressing toward goals - comment (pt fatigued quickly)     Plan Discharge plan remains appropriate       End of Session Equipment Utilized During Treatment: Gait belt;Rolling walker [name])   Activity Tolerance Patient limited by fatigue   Patient Left in chair;with call bell/phone within reach           Time: 1050-1113 OT Time Calculation (min): 23 min  Charges: OT General Charges $OT Visit: 1 Procedure OT Treatments $Self Care/Home Management : 23-37 mins  [name] [phone] 05/13/2014, 3:33 PM

## 2014-05-13 NOTE — Progress Notes (Signed)
ANTICOAGULATION CONSULT NOTE - Follow Up Consult  Pharmacy Consult for Coumadin Indication: h/o DVT  Allergies  Allergen Reactions  . Iohexol Itching and Rash         Patient Measurements: Height: 5\' 9"  (175.3 cm) Weight: 166 lb 14.2 oz (75.7 kg) IBW/kg (Calculated) : 70.7 Heparin Dosing Weight:    Vital Signs: Temp: 98.9 F (37.2 C) (12/21 0642) Temp Source: Oral (12/21 0642) BP: 121/95 mmHg (12/21 0642) Pulse Rate: 126 (12/21 0642)  Labs:  Recent Labs  05/11/14 0310 05/12/14 0359 05/13/14 0408  HGB 12.7*  --   --   HCT 37.2*  --   --   PLT 137*  --   --   LABPROT 23.6* 23.9* 26.9*  INR 2.09* 2.11* 2.46*  CREATININE  --   --  1.45*    Estimated Creatinine Clearance: 33.2 mL/min (by C-G formula based on Cr of 1.45).   Assessment: CC: 80 y/o male Presents with generalized weakness and left hip pain after fall. Pt was on Vancomycin/Cefepime during last hospitalization in past 3 weeks.  AC/heme: Warf-Rx: hx DVT; INR 2.46 up. PTA: 2.5 mg/d  ID: r/o sepsis, recent [facility]CAP hospitalization - watching off abx; Tmax 99.8, WBC wnl, PCT 0.11 (x 2); Valacyclovir for shingles - 1g q24h x 7 days (renally adjusted)  vanc 12/16>>12/17 cefepime 12/16>>12/17  12/16 UCx - Negative 12/16 BCx2 - ngtd  CV: hx HTN, HF, PVD,; BP 121/95, HR 126 on po Lasix, metoprolol BID.  Renal: CKD III, SCr 1.45, Crcl 33  Pulm: COPD - 2L 91%, chronic prednisone resumed (has RA)  PTA Med Issues: not reconciled yet - imdur, MTX  Best Practices: warfarin  Goal of Therapy:  INR 2-3 Monitor platelets by anticoagulation protocol: Yes   Plan:  1. Resume Coumadin PTA regimen 2.5mg  daily 2. Daily INR   Ginia Rudell S. 1/17, PharmD, BCPS Clinical Staff Pharmacist Pager [phone]  [phone] [place] 05/13/2014,11:07 AM

## 2014-05-14 ENCOUNTER — Ambulatory Visit: Payer: Medicare HMO | Attending: Orthopaedic Surgery

## 2014-05-14 DIAGNOSIS — I739 Peripheral vascular disease, unspecified: Secondary | ICD-10-CM | POA: Insufficient documentation

## 2014-05-14 DIAGNOSIS — Z5189 Encounter for other specified aftercare: Secondary | ICD-10-CM | POA: Insufficient documentation

## 2014-05-14 DIAGNOSIS — M868X7 Other osteomyelitis, ankle and foot: Secondary | ICD-10-CM | POA: Insufficient documentation

## 2014-05-14 DIAGNOSIS — N189 Chronic kidney disease, unspecified: Secondary | ICD-10-CM | POA: Insufficient documentation

## 2014-05-14 DIAGNOSIS — I509 Heart failure, unspecified: Secondary | ICD-10-CM | POA: Insufficient documentation

## 2014-05-14 DIAGNOSIS — M545 Low back pain: Secondary | ICD-10-CM | POA: Insufficient documentation

## 2014-05-14 DIAGNOSIS — Z89422 Acquired absence of other left toe(s): Secondary | ICD-10-CM | POA: Insufficient documentation

## 2014-05-14 LAB — CULTURE, BLOOD (ROUTINE X 2)
CULTURE: NO GROWTH
Culture: NO GROWTH

## 2014-05-24 DEATH — deceased

## 2014-05-27 ENCOUNTER — Encounter: Payer: Self-pay | Admitting: Physical Therapy

## 2014-05-27 NOTE — Therapy (Signed)
[place], [place], [address] Phone: [phone]   Fax:  [phone]  Patient Details  Name: [name] [name] MRN: [id number] Date of Birth: 1921/05/02  Encounter Date: 05/27/2014 PHYSICAL THERAPY DISCHARGE SUMMARY  Visits from Start of Care: 2 Current functional level related to goals / functional outcomes: PT LONG TERM GOAL #1     Title  Demonstrate and/or verbalize techniques to reduce the risk of re-injury to include info on: fall prevention, body mechanics    Time  6    Period  Weeks    Status  On-going    PT LONG TERM GOAL #2    Title  be indep with HEP     Time  6    Period  Weeks    Status  On-going    PT LONG TERM GOAL #3    Title  TUG score will improve to 55 secs with RW to decrease risk for falls.     Time  6    Period  Weeks    Status  On-going    PT LONG TERM GOAL #4    Title  Walk 200 feet indep with RW without pain and without change in vital signs    Time  6    Period  Weeks    Status  On-going    PT LONG TERM GOAL #5    Title  FOTO will improve to 53% limited by DC    Time  6    Period  Weeks    Status  On-going           Frequency/Duration        Outpatient Rehab from 04/11/2014 in [facility]    PT Frequency  2x / week       Precautions/Restrictions     No data to display          Remaining deficits: unknown   Education / Equipment: HEP, posture Plan: Patient agrees to discharge.  Patient goals were not met. Patient is being discharged due to not returning since the last visit.  ?????    Raeford Razor, PT 05/27/2014 10:04 AM Phone: [phone] Fax: [phone]   PAA,JENNIFER 05/27/2014, 10:02 AM  [facility] [address] [place], [place], [address] Phone: [phone]   Fax:  [phone]

## 2014-05-28 ENCOUNTER — Telehealth: Payer: Self-pay | Admitting: Interventional Cardiology

## 2014-05-28 NOTE — Telephone Encounter (Signed)
New Message  Pt daughter wanted to speak with Misty Stanley specifically. Please call back and discuss.

## 2014-05-28 NOTE — Telephone Encounter (Signed)
Returned pt daughter call.she called to update Dr.Smith and I. The pt passed away 15-Jun-2023. Condolences given. Dr.Smith aware

## 2014-06-03 ENCOUNTER — Ambulatory Visit: Payer: Medicare HMO | Admitting: Pulmonary Disease

## 2014-06-03 ENCOUNTER — Encounter (HOSPITAL_COMMUNITY): Payer: Medicare HMO

## 2014-06-04 ENCOUNTER — Telehealth: Payer: Self-pay

## 2014-06-04 ENCOUNTER — Other Ambulatory Visit: Payer: Medicare HMO

## 2014-06-04 ENCOUNTER — Other Ambulatory Visit (HOSPITAL_COMMUNITY): Payer: Medicare HMO

## 2014-06-04 NOTE — Telephone Encounter (Signed)
Patient died per Obituary °

## 2014-06-11 ENCOUNTER — Other Ambulatory Visit: Payer: Medicare HMO

## 2014-07-02 ENCOUNTER — Ambulatory Visit: Payer: Medicare HMO | Admitting: Interventional Cardiology

## 2015-07-10 IMAGING — DX DG CHEST 2V
2 series · 2 of 2 positions shown · non-contrast
Comparison: 04/22/2014 and earlier.

CLINICAL DATA: [AGE] male who fell this morning with
shortness of breath and left hip pain. Initial encounter.

EXAM:
CHEST  2 VIEW

[chest lat]
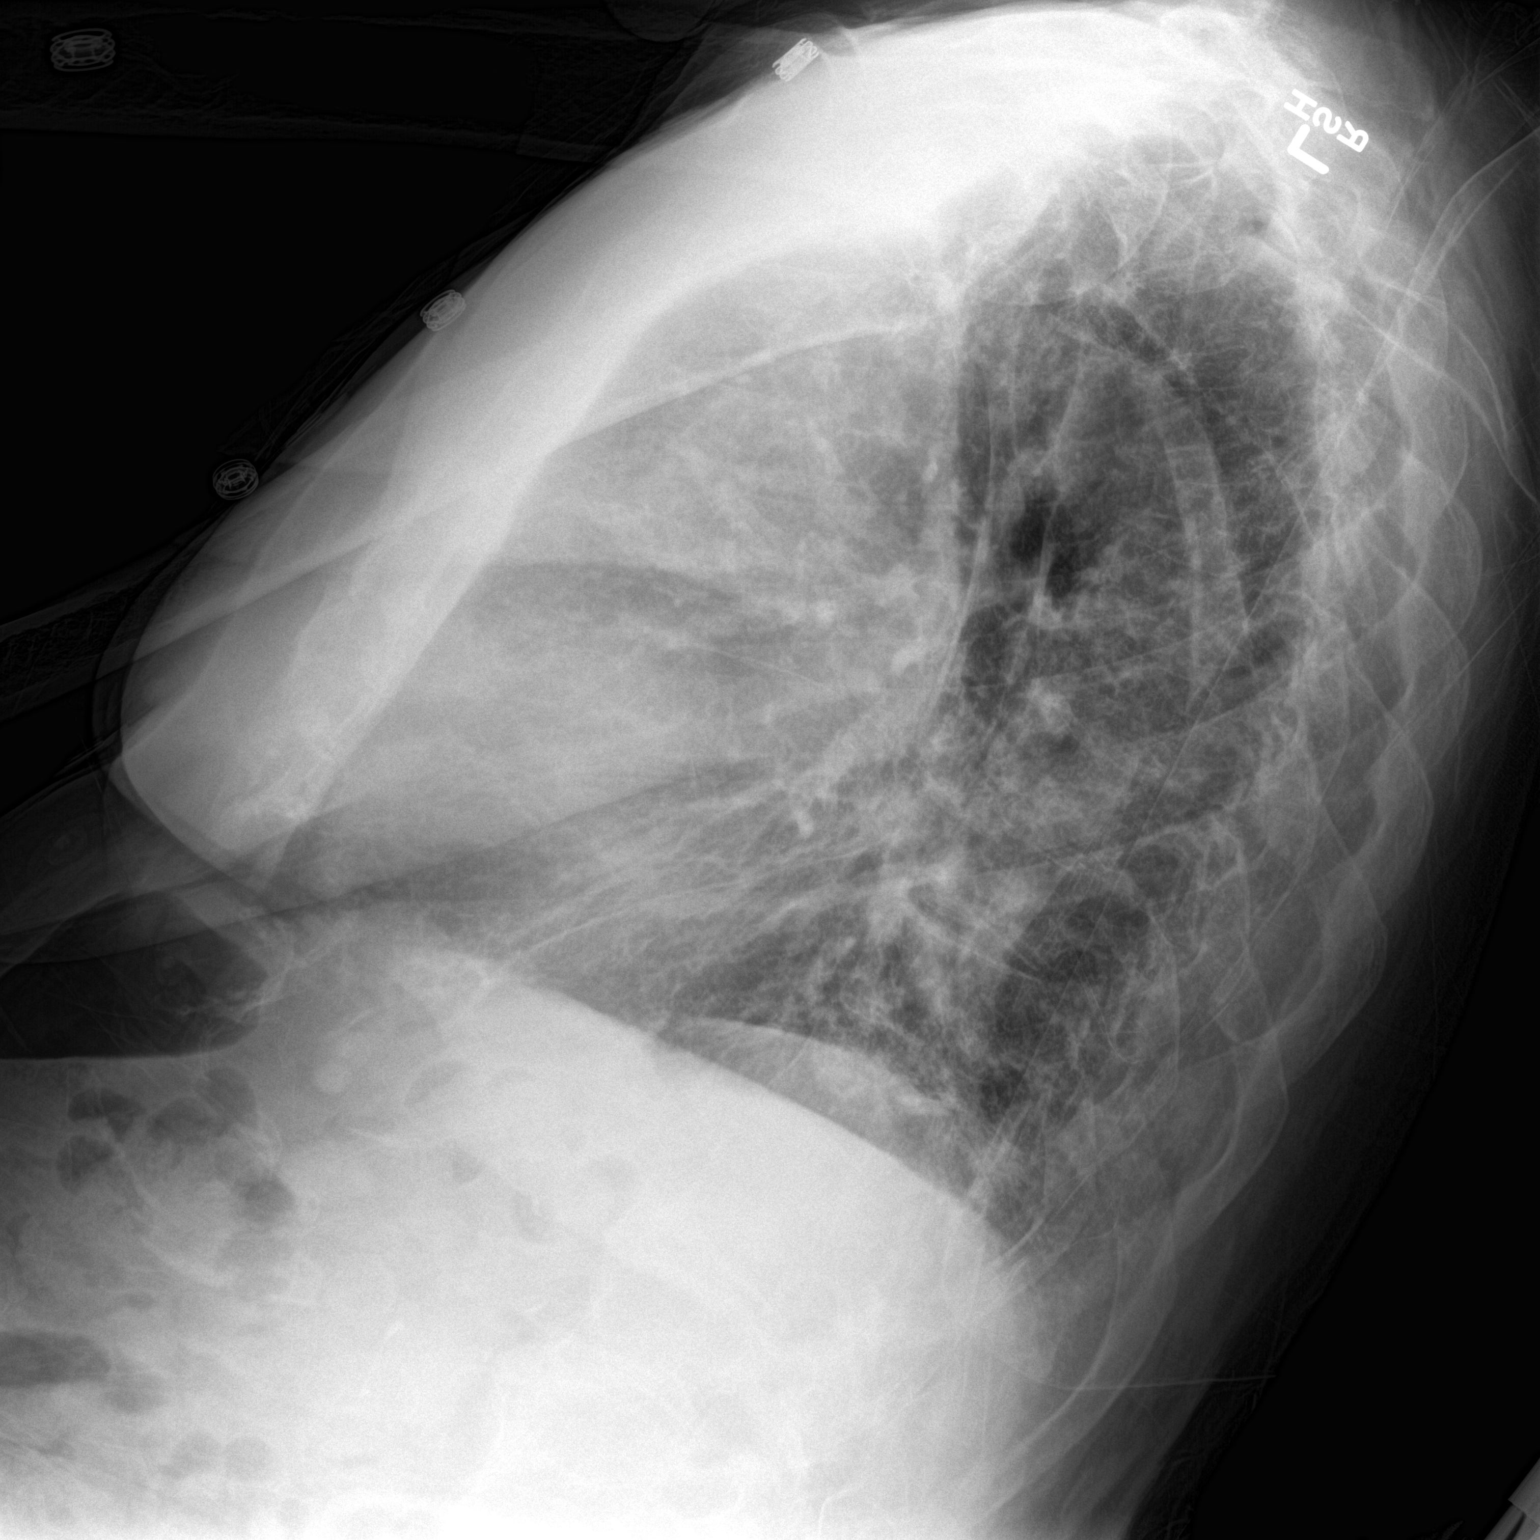

[chest ap]
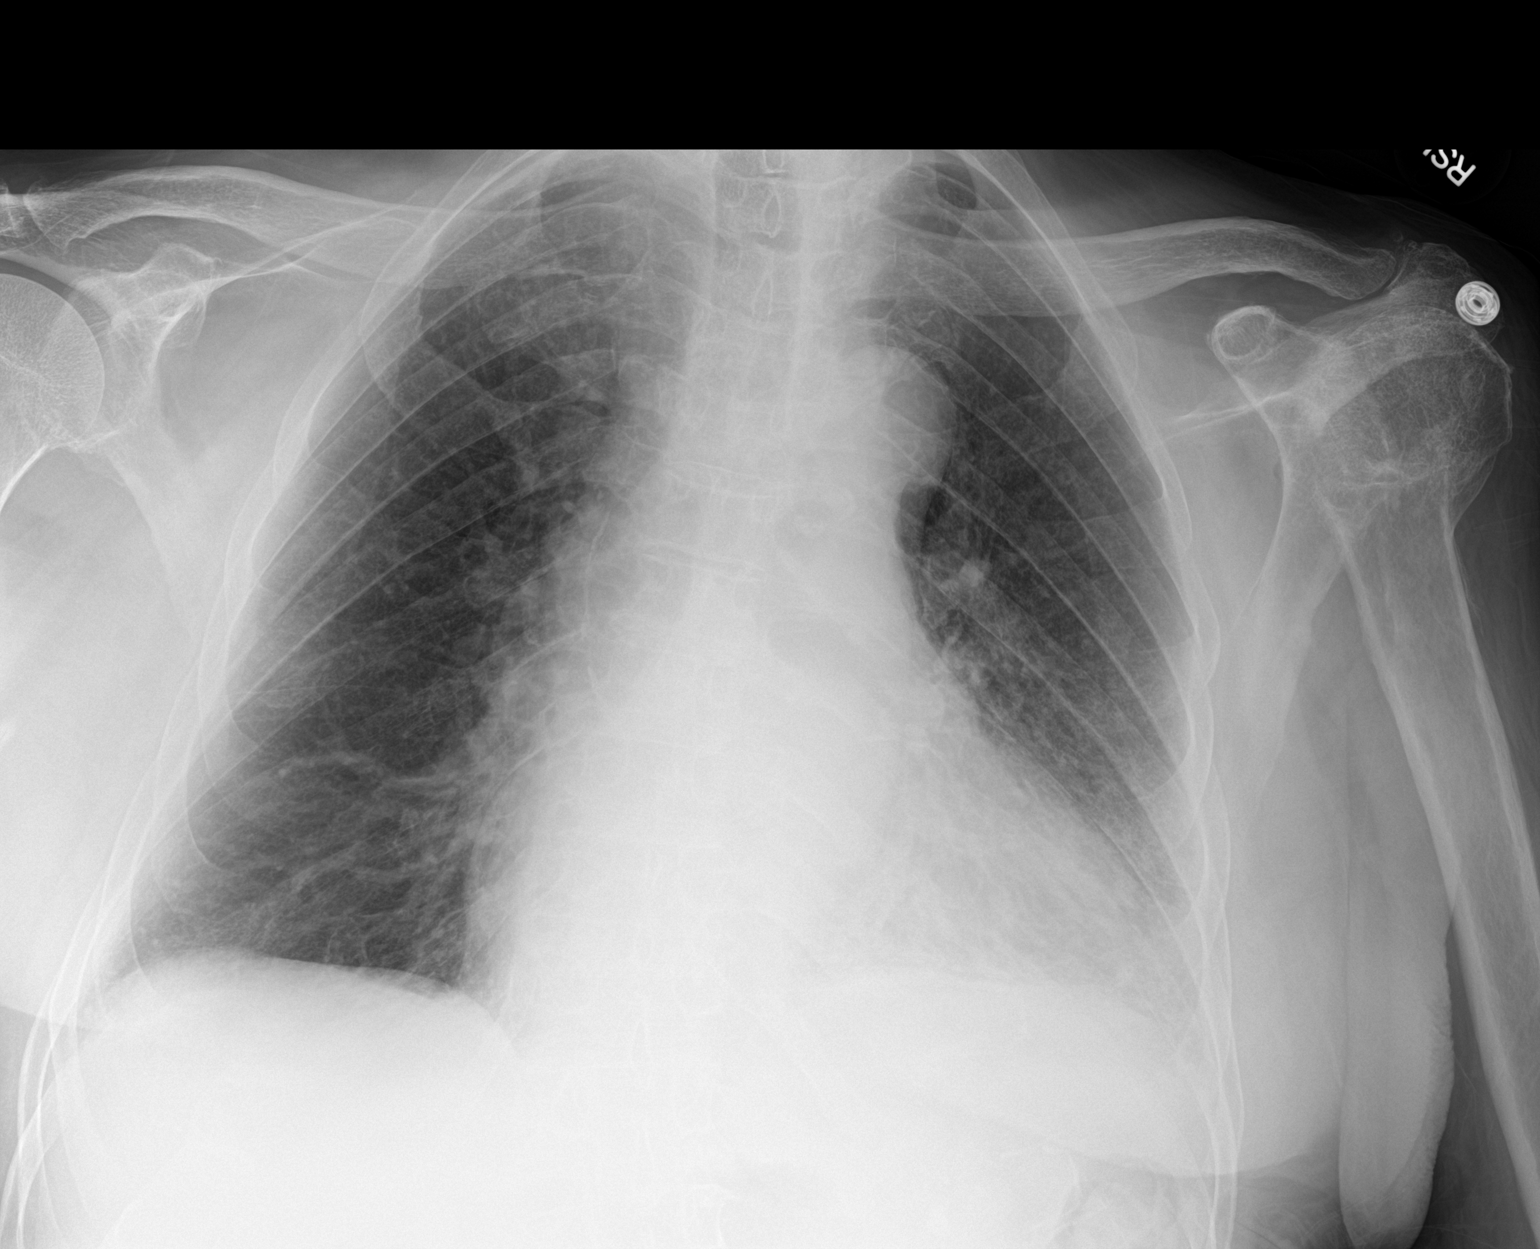

[2 of 2 positions shown; findings below may reference images not displayed]

FINDINGS: Seated upright AP and lateral views of the chest. The patient arms
are not raised. Stable cardiomegaly and mediastinal contours. No
pneumothorax or pulmonary edema. Mildly increased elevation of the
left hemidiaphragm and streaky left lung base opacity. No other
acute pulmonary opacity. Osteopenia. Chronic deformity of the
proximal left humerus. Calcified aortic atherosclerosis.
IMPRESSION: Chronic cardiomegaly with mildly increased left lung base opacity,
favor atelectasis.
